# Patient Record
Sex: Male | Born: 1955
Health system: Southern US, Community
[De-identification: ages and names within clinical notes are randomized; demographics above are authoritative.]

## PROBLEM LIST (undated history)

## (undated) DIAGNOSIS — M79669 Pain in unspecified lower leg: Secondary | ICD-10-CM

## (undated) DIAGNOSIS — R42 Dizziness and giddiness: Secondary | ICD-10-CM

## (undated) DIAGNOSIS — Z9889 Other specified postprocedural states: Secondary | ICD-10-CM

## (undated) DIAGNOSIS — M545 Low back pain, unspecified: Secondary | ICD-10-CM

## (undated) DIAGNOSIS — I6529 Occlusion and stenosis of unspecified carotid artery: Secondary | ICD-10-CM

## (undated) DIAGNOSIS — I2510 Atherosclerotic heart disease of native coronary artery without angina pectoris: Secondary | ICD-10-CM

## (undated) DIAGNOSIS — J449 Chronic obstructive pulmonary disease, unspecified: Secondary | ICD-10-CM

## (undated) DIAGNOSIS — R002 Palpitations: Secondary | ICD-10-CM

## (undated) DIAGNOSIS — R0600 Dyspnea, unspecified: Secondary | ICD-10-CM

## (undated) DIAGNOSIS — Z720 Tobacco use: Secondary | ICD-10-CM

## (undated) DIAGNOSIS — I10 Essential (primary) hypertension: Secondary | ICD-10-CM

## (undated) DIAGNOSIS — E7800 Pure hypercholesterolemia, unspecified: Secondary | ICD-10-CM

## (undated) DIAGNOSIS — R011 Cardiac murmur, unspecified: Secondary | ICD-10-CM

## (undated) DIAGNOSIS — C801 Malignant (primary) neoplasm, unspecified: Secondary | ICD-10-CM

## (undated) DIAGNOSIS — Z86010 Personal history of colonic polyps: Secondary | ICD-10-CM

## (undated) DIAGNOSIS — Z87442 Personal history of urinary calculi: Secondary | ICD-10-CM

## (undated) HISTORY — DX: Pain in unspecified lower leg: M79.669

## (undated) HISTORY — DX: Malignant (primary) neoplasm, unspecified: C80.1

## (undated) HISTORY — DX: Essential (primary) hypertension: I10

## (undated) HISTORY — DX: Low back pain: M54.5

## (undated) HISTORY — DX: Dizziness and giddiness: R42

## (undated) HISTORY — DX: Dyspnea, unspecified: R06.00

## (undated) HISTORY — DX: Personal history of colonic polyps: Z86.010

## (undated) HISTORY — DX: Pure hypercholesterolemia, unspecified: E78.00

## (undated) HISTORY — DX: Occlusion and stenosis of unspecified carotid artery: I65.29

## (undated) HISTORY — DX: Low back pain, unspecified: M54.50

## (undated) HISTORY — DX: Palpitations: R00.2

## (undated) HISTORY — PX: SKIN CANCER EXCISION: SHX779

## (undated) HISTORY — PX: MELANOMA EXCISION: SHX5266

---

## 1999-09-27 ENCOUNTER — Other Ambulatory Visit: Admission: RE | Admit: 1999-09-27 | Discharge: 1999-09-27 | Payer: Self-pay | Admitting: Otolaryngology

## 1999-09-27 ENCOUNTER — Encounter (INDEPENDENT_AMBULATORY_CARE_PROVIDER_SITE_OTHER): Payer: Self-pay

## 2010-01-29 ENCOUNTER — Encounter: Payer: Self-pay | Admitting: Cardiovascular Disease

## 2010-07-30 ENCOUNTER — Encounter: Payer: Self-pay | Admitting: Cardiovascular Disease

## 2010-08-06 ENCOUNTER — Encounter: Payer: Self-pay | Admitting: Cardiovascular Disease

## 2010-08-20 ENCOUNTER — Ambulatory Visit
Admission: RE | Admit: 2010-08-20 | Discharge: 2010-08-20 | Payer: Self-pay | Source: Home / Self Care | Attending: Cardiovascular Disease | Admitting: Cardiovascular Disease

## 2010-08-20 ENCOUNTER — Encounter: Payer: Self-pay | Admitting: Cardiovascular Disease

## 2010-08-20 DIAGNOSIS — R0602 Shortness of breath: Secondary | ICD-10-CM | POA: Insufficient documentation

## 2010-08-20 DIAGNOSIS — M545 Low back pain, unspecified: Secondary | ICD-10-CM | POA: Insufficient documentation

## 2010-08-20 DIAGNOSIS — H9190 Unspecified hearing loss, unspecified ear: Secondary | ICD-10-CM | POA: Insufficient documentation

## 2010-08-20 DIAGNOSIS — R079 Chest pain, unspecified: Secondary | ICD-10-CM | POA: Insufficient documentation

## 2010-08-20 DIAGNOSIS — H9319 Tinnitus, unspecified ear: Secondary | ICD-10-CM | POA: Insufficient documentation

## 2010-08-20 DIAGNOSIS — R42 Dizziness and giddiness: Secondary | ICD-10-CM | POA: Insufficient documentation

## 2010-08-20 DIAGNOSIS — Z85828 Personal history of other malignant neoplasm of skin: Secondary | ICD-10-CM | POA: Insufficient documentation

## 2010-08-20 DIAGNOSIS — R002 Palpitations: Secondary | ICD-10-CM | POA: Insufficient documentation

## 2010-08-26 NOTE — Assessment & Plan Note (Signed)
Summary: chest pain/palps/mt   CC:  referal from Dr. Prince Rome for palpitations and chest pain .  History of Present Illness: Jackson Mathews is seen today at the request of Dr Prince Rome.  He has had episodes of dizzyness with nausea and vohmiting.  Associated with SSCP.  Deep pain, not always exertional Cannot really tell me when the pains started.  Seen by ENT and no physical abnormalities to account for vertigo.  Symptoms improving.  Smoker.  Last tried to quit 6 months ago.  1ppd.  Has been wearing nicotine patch for a week.  Counseled for less than 10 minutes and I think the patch is a good approach in combination with electronic cigarette.  He has some dyspnea with no cough or sputum.  Dyspnea likely related to smoking.  Has not had CXR in over a year. Denies syncope, palpitations, edema history of CAD or edema  Current Problems (verified): 1)  Dyspnea  (ICD-786.05) 2)  Chest Pain  (ICD-786.50) 3)  Palpitations  (ICD-785.1) 4)  Low Back Pain, Mild  (ICD-724.2) 5)  Tinnitus  (ICD-388.30) 6)  Skin Cancer, Hx of  (ICD-V10.83) 7)  Dizziness  (ICD-780.4) 8)  Hearing Loss  (ICD-389.9)  Current Medications (verified): 1)  Meclizine Hcl 25 Mg Chew (Meclizine Hcl) .... As Needed 2)  Nicotine 14 Mg/24hr Pt24 (Nicotine) .... As Needed  Allergies (verified): 1)  ! Codeine  Past History:  Past Medical History: Last updated: 08/20/2010 CHEST PAIN  PALPITATIONS  LOW BACK PAIN, MILD TINNITUS SKIN CANCER, HX OF  DIZZINESS HEARING LOSS Lump in right neck being treated with amoxicillin  Past Surgical History: Last updated: 08/20/2010 skin cancer removal  Family History: Last updated: 08/20/2010 CAD Cancer Diabetes lung disease  Social History: Last updated: 08/20/2010 Tobacco Use - Yes.  Alcohol Use - no Single   Review of Systems       Denies fever, malais, weight loss, blurry vision, decreased visual acuity, cough, sputum, hemoptysis, pleuritic pain, palpitaitons, heartburn, abdominal  pain, melena, lower extremity edema, claudication, or rash.   Vital Signs:  Patient profile:   55 year old male Height:      68 inches Weight:      139 pounds BMI:     21.21 Pulse rate:   74 / minute Resp:     14 per minute BP sitting:   144 / 83  (left arm)  Vitals Entered By: Kem Parkinson (August 20, 2010 9:26 AM)  Physical Exam  General:  Affect appropriate Healthy:  appears stated age HEENT: normal Neck supple with no adenopathy JVP normal no bruits no thyromegaly Lungs clear with no wheezing and good diaphragmatic motion Heart:  S1/S2 no murmur,rub, gallop or click PMI normal Abdomen: benighn, BS positve, no tenderness, no AAA no bruit.  No HSM or HJR Distal pulses intact with no bruits No edema Neuro non-focal Skin warm and dry    Impression & Recommendations:  Problem # 1:  DYSPNEA (ICD-786.05) CXR counseled on smoking cessation  F/U Dr Prince Rome Orders: T-2 View CXR (71020TC)  Problem # 2:  CHEST PAIN (ICD-786.50) Atypical normal ECG  F/U ETT Orders: Treadmill (Treadmill)  Patient Instructions: 1)  Your physician has requested that you have an exercise tolerance test.  For further information please visit [email or web address]  Please also follow instruction sheet, as given.

## 2010-08-27 ENCOUNTER — Encounter (INDEPENDENT_AMBULATORY_CARE_PROVIDER_SITE_OTHER): Payer: 59 | Admitting: Physician Assistant

## 2010-08-27 ENCOUNTER — Encounter: Payer: Self-pay | Admitting: Physician Assistant

## 2010-08-27 ENCOUNTER — Encounter (INDEPENDENT_AMBULATORY_CARE_PROVIDER_SITE_OTHER): Payer: 59

## 2010-08-27 DIAGNOSIS — M79609 Pain in unspecified limb: Secondary | ICD-10-CM | POA: Insufficient documentation

## 2010-08-27 DIAGNOSIS — R0989 Other specified symptoms and signs involving the circulatory and respiratory systems: Secondary | ICD-10-CM

## 2010-08-27 DIAGNOSIS — R079 Chest pain, unspecified: Secondary | ICD-10-CM

## 2010-09-01 NOTE — Letter (Signed)
Summary: [facility]   Imported By: Marylou Mccoy 08/20/2010 09:15:28  _____________________________________________________________________  External Attachment:    Type:   Image     Comment:   External Document

## 2010-09-01 NOTE — Letter (Signed)
Summary: [facility]   Imported By: Marylou Mccoy 08/20/2010 09:14:23  _____________________________________________________________________  External Attachment:    Type:   Image     Comment:   External Document

## 2010-09-01 NOTE — Letter (Signed)
Summary: [facility]   Imported By: Marylou Mccoy 08/20/2010 09:13:27  _____________________________________________________________________  External Attachment:    Type:   Image     Comment:   External Document

## 2010-09-22 ENCOUNTER — Encounter: Payer: Self-pay | Admitting: Cardiovascular Disease

## 2010-09-22 ENCOUNTER — Ambulatory Visit (INDEPENDENT_AMBULATORY_CARE_PROVIDER_SITE_OTHER): Payer: 59 | Admitting: Cardiovascular Disease

## 2010-09-22 ENCOUNTER — Encounter (INDEPENDENT_AMBULATORY_CARE_PROVIDER_SITE_OTHER): Payer: 59

## 2010-09-22 DIAGNOSIS — R0989 Other specified symptoms and signs involving the circulatory and respiratory systems: Secondary | ICD-10-CM

## 2010-09-22 DIAGNOSIS — M79609 Pain in unspecified limb: Secondary | ICD-10-CM

## 2010-09-22 DIAGNOSIS — I70219 Atherosclerosis of native arteries of extremities with intermittent claudication, unspecified extremity: Secondary | ICD-10-CM

## 2010-09-22 DIAGNOSIS — R42 Dizziness and giddiness: Secondary | ICD-10-CM

## 2010-09-22 DIAGNOSIS — R072 Precordial pain: Secondary | ICD-10-CM

## 2010-09-30 NOTE — Assessment & Plan Note (Signed)
Summary: ROV/ABI AT 2PM/DM appt confirm=mj   CC:  check up.  History of Present Illness: Jackson Mathews is seen today at the request of Dr Prince Rome.  He has had episodes of dizzyness with nausea and vohmiting.  Associated with SSCP.  Deep pain, not always exertional Cannot really tell me when the pains started.  Seen by ENT and no physical abnormalities to account for vertigo.  Symptoms improving.  Smoker.  Last tried to quit 6 months ago.  1ppd.  Has been wearing nicotine patch  but back to smoking a lot k.  Counseled for less than 10 minutes and I think the patch is a good approach in combination with electronic cigarette.  He has some dyspnea with no cough or sputum.  CXR did show emphysema and I told him he has already had some lung damage form his cigaretts  Initial work up last month had CXR showing emphysema, normal ETT and today he had normal ABI's at there was calf pain with exercise  Current Problems (verified): 1)  Carotid Bruit, Left  (ICD-785.9) 2)  Calf Pain  (ICD-729.5) 3)  Dyspnea  (ICD-786.05) 4)  Chest Pain  (ICD-786.50) 5)  Palpitations  (ICD-785.1) 6)  Low Back Pain, Mild  (ICD-724.2) 7)  Tinnitus  (ICD-388.30) 8)  Skin Cancer, Hx of  (ICD-V10.83) 9)  Dizziness  (ICD-780.4) 10)  Hearing Loss  (ICD-389.9)  Current Medications (verified): 1)  Meclizine Hcl 25 Mg Chew (Meclizine Hcl) .... As Needed 2)  Nicotine 14 Mg/24hr Pt24 (Nicotine) .... As Needed 3)  Nasal Spray .... As Needed  Allergies (verified): 1)  ! Codeine  Past History:  Past Medical History: Last updated: 08/20/2010 CHEST PAIN  PALPITATIONS  LOW BACK PAIN, MILD TINNITUS SKIN CANCER, HX OF  DIZZINESS HEARING LOSS Lump in right neck being treated with amoxicillin  Past Surgical History: Last updated: 08/20/2010 skin cancer removal  Family History: Last updated: 08/20/2010 CAD Cancer Diabetes lung disease  Social History: Last updated: 08/20/2010 Tobacco Use - Yes.  Alcohol Use - no Single     Review of Systems       Denies fever, malais, weight loss, blurry vision, decreased visual acuity, cough, sputum, hemoptysis, pleuritic pain, palpitaitons, heartburn, abdominal pain, melena, lower extremity edema, claudication, or rash.   Vital Signs:  Patient profile:   55 year old male Height:      68 inches Weight:      139 pounds BMI:     21.21 Pulse rate:   78 / minute Resp:     14 per minute BP sitting:   130 / 80  (left arm)  Vitals Entered By: Kem Parkinson (September 22, 2010 3:28 PM)  Physical Exam  General:  Affect appropriate Healthy:  appears stated age HEENT: normal Neck supple with no adenopathy JVP normal left bruits no thyromegaly Lungs clear with no wheezing and good diaphragmatic motion Heart:  S1/S2 no murmur,rub, gallop or click PMI normal Abdomen: benighn, BS positve, no tenderness, no AAA no bruit.  No HSM or HJR Distal pulses intact with no bruits No edema Neuro non-focal Skin warm and dry    Impression & Recommendations:  Problem # 1:  CAROTID BRUIT, LEFT (ICD-785.9)  F/U duplex especially given smoking  Orders: Carotid Duplex (Carotid Duplex)  Problem # 2:  CALF PAIN (ICD-729.5) Secondary to deconditoning.  ABI's normal wtih no evidence of PVD  Problem # 3:  CHEST PAIN (ICD-786.50) Reolsved.  Normal ETT  Continue RF modification  Problem # 4:  DYSPNEA (ICD-786.05) COPD F/U Dr Prince Rome Consider PFT;s  Patient Instructions: 1)  Your physician recommends that you schedule a follow-up appointment in: 1 year with Dr. Eden Emms  2)  Your physician recommends that you continue on your current medications as directed. Please refer to the Current Medication list given to you today. 3)  Your physician has requested that you have a carotid duplex. This test is an ultrasound of the carotid arteries in your neck. It looks at blood flow through these arteries that supply the brain with blood. Allow one hour for this exam. There are no restrictions  or special instructions.

## 2010-10-01 ENCOUNTER — Encounter (INDEPENDENT_AMBULATORY_CARE_PROVIDER_SITE_OTHER): Payer: 59

## 2010-10-01 ENCOUNTER — Encounter: Payer: Self-pay | Admitting: Cardiovascular Disease

## 2010-10-01 DIAGNOSIS — I6529 Occlusion and stenosis of unspecified carotid artery: Secondary | ICD-10-CM

## 2011-04-08 ENCOUNTER — Other Ambulatory Visit: Payer: Self-pay | Admitting: Cardiovascular Disease

## 2011-04-08 DIAGNOSIS — I6529 Occlusion and stenosis of unspecified carotid artery: Secondary | ICD-10-CM

## 2011-04-11 ENCOUNTER — Encounter (INDEPENDENT_AMBULATORY_CARE_PROVIDER_SITE_OTHER): Payer: 59 | Admitting: *Deleted

## 2011-04-11 DIAGNOSIS — I6529 Occlusion and stenosis of unspecified carotid artery: Secondary | ICD-10-CM

## 2011-04-13 ENCOUNTER — Telehealth: Payer: Self-pay | Admitting: Cardiovascular Disease

## 2011-04-13 NOTE — Telephone Encounter (Signed)
Spoke with pt, carotid dopplers forwarded to [facility]. Await appt for eval Jackson Mathews

## 2011-04-13 NOTE — Telephone Encounter (Signed)
Pt was calling to f/u on the date for his surgery he still has not heard anything

## 2011-04-15 ENCOUNTER — Encounter: Payer: Self-pay | Admitting: Vascular Surgery

## 2011-04-15 ENCOUNTER — Encounter: Payer: Self-pay | Admitting: Surgery

## 2011-04-18 ENCOUNTER — Encounter: Payer: Self-pay | Admitting: Surgery

## 2011-04-18 ENCOUNTER — Ambulatory Visit (INDEPENDENT_AMBULATORY_CARE_PROVIDER_SITE_OTHER): Payer: 59 | Admitting: Surgery

## 2011-04-18 VITALS — BP 138/83 | HR 69 | Resp 16 | Ht 68.0 in | Wt 137.5 lb

## 2011-04-18 DIAGNOSIS — I6529 Occlusion and stenosis of unspecified carotid artery: Secondary | ICD-10-CM

## 2011-04-18 NOTE — Progress Notes (Signed)
[facility] [place]   Patient name: Jackson Mathews MRN: [id number] DOB: September 10, 1955 Sex: male   Referred by: Dr. Eden Emms  Reason for referral:  Chief Complaint  Patient presents with  . Carotid    REF->  Dr. Charlton Haws     HISTORY OF PRESENT ILLNESS: This is a 55 year old gentleman seen at the request of Dr.Nishan for evaluation of carotid stenosis. The patient has been followed with serial ultrasounds for her carotid lesion improves auscultated recently and his repeat ultrasound showed greater than 80% stenosis. The patient denies symptoms. Specifically he denies numbness or weakness in either extremity no slurred speech no amaurosis fugax. He does have a history of dizzy spells. He takes meclizine as needed. He continues to be a smoker smoking approximately one to 2 packs per day.  Past Medical History  Diagnosis Date  . Palpitations   . Lower back pain   . Hearing loss   . Dyspnea   . Calf pain   . Chest pain   . Tinnitus   . Vertigo   . Cancer     Skin  . Carotid artery occlusion     Past Surgical History  Procedure Date  . Skin cancer excision   . Melanoma excision     l eye    History   Social History  . Marital Status: Single    Spouse Name: N/A    Number of Children: N/A  . Years of Education: N/A   Occupational History  . Not on file.   Social History Main Topics  . Smoking status: Current Everyday Smoker -- 2.0 packs/day for 30 years    Types: Cigarettes  . Smokeless tobacco: Not on file  . Alcohol Use: No  . Drug Use: No  . Sexually Active:    Other Topics Concern  . Not on file   Social History Narrative  . No narrative on file    Family History  Problem Relation Age of Onset  . Heart disease Mother   . Cancer Father   . Diabetes Brother     Allergies as of 04/18/2011 - Review Complete 04/18/2011  Allergen Reaction Noted  . Codeine      No current outpatient prescriptions on file prior to visit.      REVIEW OF SYSTEMS: Positive for shortness of breath with exertion? Heart murmur dizziness. All the review of systems are negative  PHYSICAL EXAMINATION: General: The patient appears their stated age.  Vital signs are BP 138/83  Pulse 69  Resp 16  Ht 5\' 8"  (1.727 m)  Wt 137 lb 8 oz (62.37 kg)  BMI 20.91 kg/m2 Pulmonary: There is a good air exchange bilaterally without wheezing or rales. Abdomen: Soft and non-tender with normal pitch bowel sounds. Aorta is palpable and nonaneurysmal Musculoskeletal: There are no major deformities.  There is no significant extremity pain. Neurologic: No focal weakness or paresthesias are detected, Skin: There are no ulcer or rashes noted. Psychiatric: The patient has normal affect. Cardiovascular: There is a regular rate and rhythm without significant murmur appreciated. Right carotid bruit pedal pulses are palpable  Diagnostic Studies: Ultrasound from the [facility] office shows greater than 80% right carotid stenosis with 40-59% left carotid stenosis velocities on the right are  361/153  Outside Studies/Documentation Historical records were reviewed.  They showed asymptomatic right carotid stenosis  Medication Changes: I added a baby aspirin  Assessment:  Asymptomatic right carotid stenosis Plan: I discussed the options for treating carotid  disease. We discussed briefly stenting and carotid endarterectomy. Lipase is a better candidate for carotid endarterectomy. We discussed the risks and benefits including the risk of stroke the risk of nerve injury. We discussed the operative details and as well as the postoperative recovery. All his questions were answered today he is scheduled and didn't desire to attend a function we are scheduling his endarterectomy for Thursday, October 11.  The patient is complaining of swelling in his mouth he is concerned that he has an abscessed tooth he is taken amoxicillin recently that he had a home. I'm going to send  him for a formal dental evaluation so that if this is an abscessed tooth it can be addressed prior to his operation.     Jorge Ny, M.D. [facility] [place] Office: [phone]

## 2011-04-29 ENCOUNTER — Telehealth: Payer: Self-pay | Admitting: Cardiovascular Disease

## 2011-04-29 ENCOUNTER — Other Ambulatory Visit: Payer: Self-pay | Admitting: Surgery

## 2011-04-29 ENCOUNTER — Encounter (HOSPITAL_COMMUNITY)
Admission: RE | Admit: 2011-04-29 | Discharge: 2011-04-29 | Disposition: A | Discharge status: Home / Self Care | Payer: 59 | Source: Ambulatory Visit | Attending: Surgery | Admitting: Surgery

## 2011-04-29 DIAGNOSIS — I6529 Occlusion and stenosis of unspecified carotid artery: Secondary | ICD-10-CM

## 2011-04-29 LAB — CBC
HCT: 47.9 % (ref 39.0–52.0)
Hemoglobin: 16.3 g/dL (ref 13.0–17.0)
MCV: 87.4 fL (ref 78.0–100.0)
RDW: 12.9 % (ref 11.5–15.5)
WBC: 6 10*3/uL (ref 4.0–10.5)

## 2011-04-29 LAB — COMPREHENSIVE METABOLIC PANEL
ALT: 11 U/L (ref 0–53)
AST: 14 U/L (ref 0–37)
Alkaline Phosphatase: 129 U/L — ABNORMAL HIGH (ref 39–117)
CO2: 30 mEq/L (ref 19–32)
Chloride: 102 mEq/L (ref 96–112)
GFR calc non Af Amer: >90 mL/min (ref >90)
Potassium: 4.3 mEq/L (ref 3.5–5.1)
Sodium: 140 mEq/L (ref 135–145)
Total Bilirubin: 0.5 mg/dL (ref 0.3–1.2)

## 2011-04-29 LAB — URINALYSIS, ROUTINE W REFLEX MICROSCOPIC
Bilirubin Urine: NEGATIVE
Glucose, UA: NEGATIVE mg/dL
Hgb urine dipstick: NEGATIVE
Protein, ur: NEGATIVE mg/dL
Urobilinogen, UA: 0.2 mg/dL (ref 0.0–1.0)

## 2011-04-29 LAB — SURGICAL PCR SCREEN: MRSA, PCR: NEGATIVE

## 2011-04-29 LAB — ABO/RH: ABO/RH(D): O POS

## 2011-04-29 LAB — TYPE AND SCREEN: Antibody Screen: NEGATIVE

## 2011-04-29 LAB — APTT: aPTT: 30 seconds (ref 24–37)

## 2011-04-29 NOTE — Telephone Encounter (Signed)
Stress,Lov,12 lead faxed to [facility] @ [phone]  04/29/11/km

## 2011-05-05 ENCOUNTER — Inpatient Hospital Stay (HOSPITAL_COMMUNITY)
Admission: RE | Admit: 2011-05-05 | Discharge: 2011-05-06 | DRG: 039 | Disposition: A | Discharge status: Home / Self Care | Payer: 59 | Source: Ambulatory Visit | Attending: Surgery | Admitting: Surgery

## 2011-05-05 ENCOUNTER — Other Ambulatory Visit: Payer: Self-pay | Admitting: Surgery

## 2011-05-05 DIAGNOSIS — Z01812 Encounter for preprocedural laboratory examination: Secondary | ICD-10-CM

## 2011-05-05 DIAGNOSIS — J438 Other emphysema: Secondary | ICD-10-CM | POA: Diagnosis present

## 2011-05-05 DIAGNOSIS — F17200 Nicotine dependence, unspecified, uncomplicated: Secondary | ICD-10-CM | POA: Diagnosis present

## 2011-05-05 DIAGNOSIS — I6529 Occlusion and stenosis of unspecified carotid artery: Secondary | ICD-10-CM

## 2011-05-05 DIAGNOSIS — Z7982 Long term (current) use of aspirin: Secondary | ICD-10-CM

## 2011-05-05 HISTORY — PX: CAROTID ENDARTERECTOMY: SUR193

## 2011-05-05 LAB — CARDIAC PANEL(CRET KIN+CKTOT+MB+TROPI): Total CK: 112 U/L (ref 7–232)

## 2011-05-06 LAB — BASIC METABOLIC PANEL
BUN: 7 mg/dL (ref 6–23)
Chloride: 102 mEq/L (ref 96–112)
Creatinine, Ser: 0.71 mg/dL (ref 0.50–1.35)
GFR calc Af Amer: >90 mL/min (ref >90)
Glucose, Bld: 117 mg/dL — ABNORMAL HIGH (ref 70–99)
Potassium: 3.9 mEq/L (ref 3.5–5.1)

## 2011-05-06 LAB — GLUCOSE, CAPILLARY

## 2011-05-06 LAB — CBC
HCT: 41.3 % (ref 39.0–52.0)
Hemoglobin: 13.6 g/dL (ref 13.0–17.0)
MCHC: 32.9 g/dL (ref 30.0–36.0)
MCV: 87.9 fL (ref 78.0–100.0)
RDW: 13.1 % (ref 11.5–15.5)
WBC: 6.3 10*3/uL (ref 4.0–10.5)

## 2011-05-10 ENCOUNTER — Encounter: Payer: 59 | Admitting: Vascular Surgery
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
## 2011-06-03 ENCOUNTER — Encounter: Payer: Self-pay | Admitting: Surgery

## 2011-06-06 ENCOUNTER — Ambulatory Visit (INDEPENDENT_AMBULATORY_CARE_PROVIDER_SITE_OTHER): Payer: 59 | Admitting: Surgery

## 2011-06-06 ENCOUNTER — Encounter: Payer: Self-pay | Admitting: Surgery

## 2011-06-06 VITALS — BP 156/89 | HR 95 | Temp 97.9°F | Ht 68.0 in | Wt 136.0 lb

## 2011-06-06 DIAGNOSIS — I6529 Occlusion and stenosis of unspecified carotid artery: Secondary | ICD-10-CM

## 2011-06-06 DIAGNOSIS — Z9889 Other specified postprocedural states: Secondary | ICD-10-CM
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
## 2011-06-20 ENCOUNTER — Encounter: Payer: Self-pay | Admitting: *Deleted

## 2011-11-14 ENCOUNTER — Telehealth: Payer: Self-pay | Admitting: Cardiovascular Disease

## 2011-11-14 NOTE — Telephone Encounter (Signed)
New Problem:     I called the patient and was unable to reach them. I left a message on their voicemail with my name, the reason I called, the name of his physician, and a number to call back to schedule their appointment. 

## 2011-12-02 ENCOUNTER — Encounter: Payer: Self-pay | Admitting: Neurosurgery

## 2011-12-05 ENCOUNTER — Encounter: Payer: Self-pay | Admitting: Neurosurgery

## 2011-12-05 ENCOUNTER — Ambulatory Visit (INDEPENDENT_AMBULATORY_CARE_PROVIDER_SITE_OTHER): Payer: 59 | Admitting: Neurosurgery

## 2011-12-05 ENCOUNTER — Other Ambulatory Visit (INDEPENDENT_AMBULATORY_CARE_PROVIDER_SITE_OTHER): Payer: 59 | Admitting: Vascular Surgery

## 2011-12-05 VITALS — BP 127/81 | HR 67 | Resp 16 | Ht 68.0 in | Wt 133.6 lb

## 2011-12-05 DIAGNOSIS — Z48812 Encounter for surgical aftercare following surgery on the circulatory system: Secondary | ICD-10-CM

## 2011-12-05 DIAGNOSIS — I6529 Occlusion and stenosis of unspecified carotid artery: Secondary | ICD-10-CM

## 2011-12-05 NOTE — Progress Notes (Signed)
[facility] [place] HISTORY AND PHYSICAL   CC: Six-month postop carotid duplex status post right CEA Referring Physician: Brabham  History of Present Illness: 56 year old male patient of Dr. Estanislado Spire is now 6 months status post right CEA and doing well. He denies signs or symptoms of CVA, TIA, amaurosis fugax, or word finding difficulty. Patient states he had some intermittent dizziness prior to the surgery since surgery he has had no symptoms whatsoever.  Past Medical History  Diagnosis Date  . Palpitations   . Lower back pain   . Hearing loss   . Dyspnea   . Calf pain   . Chest pain   . Tinnitus   . Vertigo   . Cancer     Skin  . Carotid artery occlusion     ROS: [x]  Positive   [ ]  Denies    General: [ ]  Weight loss, [ ]  Fever, [ ]  chills Neurologic: [ ]  Dizziness, [ ]  Blackouts, [ ]  Seizure [ ]  Stroke, [ ]  "Mini stroke", [ ]  Slurred speech, [ ]  Temporary blindness; [ ]  weakness in arms or legs, [ ]  Hoarseness Cardiac: [ ]  Chest pain/pressure, [ ]  Shortness of breath at rest [ ]  Shortness of breath with exertion, [ ]  Atrial fibrillation or irregular heartbeat Vascular: [ ]  Pain in legs with walking, [ ]  Pain in legs at rest, [ ]  Pain in legs at night,  [ ]  Non-healing ulcer, [ ]  Blood clot in vein/DVT,   Pulmonary: [ ]  Home oxygen, [ ]  Productive cough, [ ]  Coughing up blood, [ ]  Asthma,  [ ]  Wheezing Musculoskeletal:  [ ]  Arthritis, [ ]  Low back pain, [ ]  Joint pain Hematologic: [ ]  Easy Bruising, [ ]  Anemia; [ ]  Hepatitis Gastrointestinal: [ ]  Blood in stool, [ ]  Gastroesophageal Reflux/heartburn, [ ]  Trouble swallowing Urinary: [ ]  chronic Kidney disease, [ ]  on HD - [ ]  MWF or [ ]  TTHS, [ ]  Burning with urination, [ ]  Difficulty urinating Skin: [ ]  Rashes, [ ]  Wounds Psychological: [ ]  Anxiety, [ ]  Depression   Social History History  Substance Use Topics  . Smoking status: Current Everyday Smoker -- 0.5 packs/day for 30 years    Types:  Cigarettes  . Smokeless tobacco: Not on file  . Alcohol Use: No    Family History Family History  Problem Relation Age of Onset  . Heart disease Mother   . Cancer Father   . Diabetes Brother     Allergies  Allergen Reactions  . Codeine     Nausea     Current Outpatient Prescriptions  Medication Sig Dispense Refill  . meclizine (ANTIVERT) 25 MG tablet Take 25 mg by mouth 3 (three) times daily as needed.        . nicotine (NICODERM CQ - DOSED IN MG/24 HOURS) 14 mg/24hr patch Place 1 patch onto the skin daily. Prn only        Physical Examination  Filed Vitals:   12/05/11 1448  BP: 127/81  Pulse: 67  Resp: 16    Body mass index is 20.31 kg/(m^2).  General:  WDWN in NAD Gait: Normal HEENT: WNL Eyes: Pupils equal Pulmonary: normal non-labored breathing , without Rales, rhonchi,  wheezing Cardiac: RRR, without  Murmurs, rubs or gallops; Abdomen: soft, NT, no masses Skin: no rashes, ulcers noted  Vascular Exam Pulses: 2+ radial pulses Carotid bruits: Bilateral carotid pulses to auscultation no bruits are heard Extremities without ischemic changes, no Gangrene ,  no cellulitis; no open wounds;  Musculoskeletal: no muscle wasting or atrophy   Neurologic: A&O X 3; Appropriate Affect ; SENSATION: normal; MOTOR FUNCTION:  moving all extremities equally. Speech is fluent/normal  Non-Invasive Vascular Imaging CAROTID DUPLEX 12/05/2011  Right ICA 0 - 19% stenosis Left ICA 40 - 59 % stenosis   ASSESSMENT/PLAN: Patient with asymptomatic carotid stenosis 6 months status post right CEA, plan will be for him to followup here in one year with repeat carotid duplex and be seen in my clinic, his questions were encouraged and answered, he is in agreement with this plan. Lauree Chandler ANP   Clinic MD: Myra Gianotti

## 2011-12-12 NOTE — Procedures (Unsigned)
CAROTID DUPLEX EXAM  INDICATION:  Carotid stenosis  HISTORY: Diabetes:  No Cardiac:  No Hypertension:  No Smoking:  Currently Previous Surgery:  Right carotid endarterectomy on 05/05/2011 CV History:  Asymptomatic Amaurosis Fugax No, Paresthesias No, Hemiparesis No                                      RIGHT             LEFT Brachial systolic pressure:         134               132 Brachial Doppler waveforms:         WNL               WNL Vertebral direction of flow:        Antegrade         Antegrade DUPLEX VELOCITIES (cm/sec) CCA peak systolic                   87                115 ECA peak systolic                   71                134 ICA peak systolic                   93                114 ICA end diastolic                   39                41 PLAQUE MORPHOLOGY:                  Not visualized    Heterogeneous PLAQUE AMOUNT:                      Not visualized    Moderate PLAQUE LOCATION:                    Not visualized    CCA/ICA  IMPRESSION: 1. Right internal carotid artery is patent with history of     endarterectomy.  No hyperplasia or hemodynamically significant     plaque is identified. 2. Bilateral external carotid arteries appear patent. 3. Left internal carotid artery stenosis present in the 40%-59% range. 4. Bilateral vertebral arteries are patent and antegrade.  ___________________________________________ V. Charlena Cross, MD  SH/MEDQ  D:  12/05/2011  T:  12/05/2011  Job:  [id number]

## 2012-11-22 ENCOUNTER — Other Ambulatory Visit: Payer: Self-pay | Admitting: *Deleted

## 2012-11-22 DIAGNOSIS — Z48812 Encounter for surgical aftercare following surgery on the circulatory system: Secondary | ICD-10-CM

## 2012-12-04 ENCOUNTER — Other Ambulatory Visit: Payer: 59

## 2012-12-04 ENCOUNTER — Ambulatory Visit: Payer: 59 | Admitting: Neurosurgery

## 2013-01-11 ENCOUNTER — Encounter: Payer: Self-pay | Admitting: Surgery

## 2013-01-14 ENCOUNTER — Other Ambulatory Visit: Payer: 59

## 2013-01-14 ENCOUNTER — Ambulatory Visit: Payer: 59 | Admitting: Surgery

## 2013-03-15 DIAGNOSIS — Z0279 Encounter for issue of other medical certificate: Secondary | ICD-10-CM

## 2013-03-18 ENCOUNTER — Other Ambulatory Visit: Payer: 59

## 2013-03-18 ENCOUNTER — Ambulatory Visit: Payer: 59 | Admitting: Surgery

## 2013-04-15 ENCOUNTER — Other Ambulatory Visit: Payer: 59

## 2013-04-15 ENCOUNTER — Ambulatory Visit: Payer: PRIVATE HEALTH INSURANCE | Admitting: Family

## 2013-05-17 ENCOUNTER — Encounter: Payer: Self-pay | Admitting: Family

## 2013-05-20 ENCOUNTER — Other Ambulatory Visit (HOSPITAL_COMMUNITY): Payer: PRIVATE HEALTH INSURANCE

## 2013-05-20 ENCOUNTER — Ambulatory Visit: Payer: PRIVATE HEALTH INSURANCE | Admitting: Family

## 2014-04-24 ENCOUNTER — Other Ambulatory Visit: Payer: Self-pay | Admitting: Dermatology

## 2014-05-22 ENCOUNTER — Encounter (HOSPITAL_COMMUNITY): Payer: Self-pay | Admitting: Emergency Medicine

## 2014-05-22 ENCOUNTER — Emergency Department (HOSPITAL_COMMUNITY)
Admission: EM | Admit: 2014-05-22 | Discharge: 2014-05-22 | Disposition: A | Discharge status: Home / Self Care | Payer: Self-pay | Attending: Emergency Medicine | Admitting: Emergency Medicine

## 2014-05-22 ENCOUNTER — Emergency Department (HOSPITAL_COMMUNITY): Payer: Self-pay

## 2014-05-22 ENCOUNTER — Emergency Department (INDEPENDENT_AMBULATORY_CARE_PROVIDER_SITE_OTHER)
Admission: EM | Admit: 2014-05-22 | Discharge: 2014-05-22 | Disposition: A | Discharge status: Nursing Home (Includes ICF) | Payer: Self-pay | Source: Home / Self Care | Attending: Family Medicine | Admitting: Family Medicine

## 2014-05-22 DIAGNOSIS — R42 Dizziness and giddiness: Secondary | ICD-10-CM

## 2014-05-22 DIAGNOSIS — Z5309 Procedure and treatment not carried out because of other contraindication: Secondary | ICD-10-CM

## 2014-05-22 DIAGNOSIS — Z720 Tobacco use: Secondary | ICD-10-CM | POA: Insufficient documentation

## 2014-05-22 DIAGNOSIS — I6523 Occlusion and stenosis of bilateral carotid arteries: Secondary | ICD-10-CM | POA: Insufficient documentation

## 2014-05-22 DIAGNOSIS — Z85828 Personal history of other malignant neoplasm of skin: Secondary | ICD-10-CM | POA: Insufficient documentation

## 2014-05-22 DIAGNOSIS — H9190 Unspecified hearing loss, unspecified ear: Secondary | ICD-10-CM | POA: Insufficient documentation

## 2014-05-22 DIAGNOSIS — R531 Weakness: Secondary | ICD-10-CM | POA: Insufficient documentation

## 2014-05-22 LAB — COMPREHENSIVE METABOLIC PANEL
ALBUMIN: 4 g/dL (ref 3.5–5.2)
ALT: 9 U/L (ref 0–53)
AST: 16 U/L (ref 0–37)
Alkaline Phosphatase: 128 U/L — ABNORMAL HIGH (ref 39–117)
Anion gap: 12 (ref 5–15)
BUN: 7 mg/dL (ref 6–23)
CHLORIDE: 101 meq/L (ref 96–112)
CO2: 29 mEq/L (ref 19–32)
CREATININE: 0.78 mg/dL (ref 0.50–1.35)
Calcium: 9.5 mg/dL (ref 8.4–10.5)
GFR calc Af Amer: >90 mL/min (ref >90)
GFR calc non Af Amer: >90 mL/min (ref >90)
Glucose, Bld: 123 mg/dL — ABNORMAL HIGH (ref 70–99)
Potassium: 5.2 mEq/L (ref 3.7–5.3)
Sodium: 142 mEq/L (ref 137–147)
Total Bilirubin: 0.4 mg/dL (ref 0.3–1.2)
Total Protein: 7.3 g/dL (ref 6.0–8.3)

## 2014-05-22 LAB — CBC WITH DIFFERENTIAL/PLATELET
BASOS ABS: 0 10*3/uL (ref 0.0–0.1)
BASOS PCT: 0 % (ref 0–1)
Eosinophils Absolute: 0 10*3/uL (ref 0.0–0.7)
Eosinophils Relative: 0 % (ref 0–5)
HCT: 47.3 % (ref 39.0–52.0)
Hemoglobin: 15.7 g/dL (ref 13.0–17.0)
Lymphocytes Relative: 28 % (ref 12–46)
Lymphs Abs: 1.4 10*3/uL (ref 0.7–4.0)
MCH: 29.7 pg (ref 26.0–34.0)
MCHC: 33.2 g/dL (ref 30.0–36.0)
MCV: 89.6 fL (ref 78.0–100.0)
MONO ABS: 0.4 10*3/uL (ref 0.1–1.0)
Monocytes Relative: 8 % (ref 3–12)
NEUTROS ABS: 3.1 10*3/uL (ref 1.7–7.7)
Neutrophils Relative %: 64 % (ref 43–77)
PLATELETS: 251 10*3/uL (ref 150–400)
RBC: 5.28 MIL/uL (ref 4.22–5.81)
RDW: 13.2 % (ref 11.5–15.5)
WBC: 4.8 10*3/uL (ref 4.0–10.5)

## 2014-05-22 MED ORDER — ASPIRIN 81 MG PO CHEW: 324.0000 mg | CHEWABLE_TABLET | Freq: Every day | ORAL | Status: DC

## 2014-05-22 NOTE — Progress Notes (Signed)
Bilateral carotid artery duplex completed:  1-39% ICA stenosis.  Vertebral artery flow is antegrade.     

## 2014-05-22 NOTE — ED Notes (Signed)
Patient transported to CT 

## 2014-05-22 NOTE — ED Provider Notes (Signed)
CSN: [id number]     Arrival date & time 05/22/14  1121 History   First MD Initiated Contact with Patient 05/22/14 1147     Chief Complaint  Patient presents with  . Dizziness   (Consider location/radiation/quality/duration/timing/severity/associated sxs/prior Treatment) HPI Comments: Patient states he has had intermittent episodes of dizziness with associated near syncope, nausea, diaphoresis and ataxia since 2013. States these occur about every 3 months, however, he is here because these episodes have increased in intensity, duration and frequency over the past 30 days. Is now having episodes every 5-7 days, the most severe of which occurred last night.  Has been trying to establish with [facility] for primary care, but is currently on new patient waiting list. 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 Hx of known vascular disease. S/P right CEA in 2012.   Patient is a 58 y.o. male presenting with dizziness. The history is provided by the patient.  Dizziness Quality:  Room spinning, lightheadedness, head spinning and imbalance Severity:  Moderate Onset quality:  Gradual Duration: Has been having "spells" since 2013. Associated symptoms: nausea     Past Medical History  Diagnosis Date  . Palpitations   . Lower back pain   . Hearing loss   . Dyspnea   . Calf pain   . Chest pain   . Tinnitus   . Vertigo   . Cancer     Skin  . Carotid artery occlusion    Past Surgical History  Procedure Laterality Date  . Skin cancer excision    . Melanoma excision      l eye  . Carotid endarterectomy  05/05/11    Right CEA   Family History  Problem Relation Age of Onset  . Heart disease Mother   . Cancer Father   . Diabetes Brother    History  Substance Use Topics  . Smoking status: Current Every Day Smoker -- 0.50 packs/day for 30 years    Types: Cigarettes  . Smokeless tobacco: Not on file  . Alcohol Use: No    Review of Systems  HENT: Negative.   Eyes:  Negative.   Respiratory: Negative.   Cardiovascular: Negative.   Gastrointestinal: Positive for nausea.  Endocrine: Negative.   Genitourinary: Negative.   Musculoskeletal: Negative.   Skin: Negative.   Neurological: Positive for dizziness, weakness and light-headedness.  Psychiatric/Behavioral: Negative.     Allergies  Codeine  Home Medications   Prior to Admission medications   Medication Sig Start Date End Date Taking? Authorizing Provider  meclizine (ANTIVERT) 25 MG tablet Take 25 mg by mouth 3 (three) times daily as needed.      Historical Provider, MD  nicotine (NICODERM CQ - DOSED IN MG/24 HOURS) 14 mg/24hr patch Place 1 patch onto the skin daily. Prn only    Historical Provider, MD   Temp(Src) 98.1 F (36.7 C) (Oral)  Resp 14  SpO2 98% Physical Exam  Nursing note and vitals reviewed. Constitutional: He is oriented to person, place, and time. He appears well-developed and well-nourished. No distress.  HENT:  Head: Normocephalic and atraumatic.  Right Ear: External ear normal.  Left Ear: External ear normal.  Nose: Nose normal.  Mouth/Throat: Oropharynx is clear and moist.  Eyes: Conjunctivae and EOM are normal. Pupils are equal, round, and reactive to light.  Neck: Normal range of motion. Neck supple.  No carotid bruits  Cardiovascular: Normal rate, regular rhythm and normal heart sounds.   Pulmonary/Chest: Effort normal and breath sounds  normal. No respiratory distress. He has no wheezes.  Abdominal: Soft. Bowel sounds are normal. He exhibits no distension. There is no tenderness.  Musculoskeletal: Normal range of motion.  Lymphadenopathy:    He has no cervical adenopathy.  Neurological: He is alert and oriented to person, place, and time. He has normal strength. No cranial nerve deficit or sensory deficit. Coordination and gait normal. GCS eye subscore is 4. GCS verbal subscore is 5. GCS motor subscore is 6.  Skin: Skin is warm and dry. No rash noted. No erythema.   Psychiatric: He has a normal mood and affect. His behavior is normal.    ED Course  Procedures (including critical care time) Labs Review Labs Reviewed - No data to display  Imaging Review No results found.   MDM   1. Dizziness   Normal VS with no focal deficits. Advised patient to report directly to [facility] ER evaluation. No access to primary care for outpatient evaluation.     Lutricia Feil, [place] 05/22/14 1311

## 2014-05-22 NOTE — ED Notes (Signed)
Per pt last "dizzy spell" was last night about "12:30 last night" and lasted for about "a couple of minutes". Pt states that he has not vomited "for a while". Pt states that he also had a "dizzy spell" yesterday morning.
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
## 2014-05-22 NOTE — ED Notes (Signed)
Pt sent from [facility] for further eval of dizziness and N/V episodes x 1 month

## 2014-05-22 NOTE — Discharge Instructions (Signed)
Atherosclerosis Atherosclerosis, or hardening of the arteries, is the buildup of plaque within the major arteries in the body. Plaque is made up of fats (lipids), cholesterol, calcium, and fibrous tissue. Plaque can narrow or block blood flow within an artery. Plaque can break off and cause damage to the affected organ. Plaque can also "rupture." When plaque ruptures within an artery, a clot can form, causing a sudden (acute) blockage of the artery. Untreated atherosclerosis can cause serious health problems or death.  RISK FACTORS  High cholesterol levels.  Smoking.  Obesity.  Lack of activity or exercise.  Eating a diet high in saturated fat.  Family history.  Diabetes. SIGNS AND SYMPTOMS  Symptoms of atherosclerosis can occur when blood flow to an artery is slowed or blocked. Severity and onset of symptoms depends on how extensive the narrowing or blockage is. A sudden plaque rupture can bring immediate, life-threatening symptoms. Atherosclerosis can affect different arteries in the body, for example:  Coronary arteries. The coronary arteries supply the heart with blood. When the coronary arteries are narrowed or blocked from atherosclerosis, this is known as coronary artery disease (CAD). CAD can cause a heart attack. Common heart attack symptoms include:  Chest pain or pain that radiates to the neck, arm, jaw, or in the upper, middle back (mid-scapular pain).  Shortness of breath without cause.  Profuse sweating while at rest.  Irregular heartbeats.  Nausea or gastrointestinal upset.  Carotid arteries. The carotid arteries supply the brain with blood. They are located on each side of your neck. When blood flow to these arteries is slowed or blocked, a transient ischemic attack (TIA) or stroke can occur. A TIA is considered a "mini-stroke" or "warning stroke." TIA symptoms are the same as stroke symptoms, but they are temporary and last less than 24 hours. A stroke can cause  permanent damage or death. Common TIA and stroke symptoms include:  Sudden numbness or weakness to one side of your body, such as the face, arm, or leg.  Sudden confusion or trouble speaking or understanding.  Sudden trouble seeing out of one or both eyes.  Sudden trouble walking, loss of balance, or dizziness.  Sudden, severe headache with no known cause.  Arteries in the legs. When arteries in the lower legs become narrowed or blocked, this is known as peripheral vascular disease (PVD). PVD can cause a symptom called claudication. Claudication is pain or a burning feeling in your legs when walking or exercising and usually goes away with rest. Very severe PVD can cause pain in your legs while at rest.  Renal arteries. The renal arteries supply the kidneys with blood. Blockage of the renal arteries can cause a decline in kidney function or high blood pressure (hypertension).  Gastrointestinal arteries (mesenteric circulation). Abdominal pain may occur after eating. DIAGNOSIS  Your health care provider may perform the following tests to diagnose atherosclerosis:  Blood tests.  Stress test.  Echocardiogram.  Nuclear scan.  Ankle/brachial index.  Ultrasonography.  Computed tomography (CT) scan.  Angiography. TREATMENT  Atherosclerosis treatment includes the following:  Lifestyle changes such as:  Quitting smoking. Your health care provider can help you with smoking cessation.  Eating a diet low in saturated fat. A registered dietitian can educate you on healthy food options, such as helping you understand the difference between good fat and bad fat.  Following an exercise program approved by your health care provider.  Maintaining a healthy weight. Lose weight as approved by your health care provider.  Have   your cholesterol levels checked as directed by your health care provider.  Medicines. Cholesterol medicines can help slow or stop the progression of  atherosclerosis.  Different procedural or surgical interventions to treat atherosclerosis include:  Balloon angioplasty. The technical name for balloon angioplasty is percutaneous transluminal angioplasty (PTA). In this procedure, a catheter with a small balloon at the tip is inserted through the blocked or narrowed artery. The balloon is then inflated. When the balloon is inflated, the fatty plaque is compressed against the artery wall, allowing better blood flow within the artery.  Balloon angioplasty and stenting. In this procedure, balloon angioplasty is combined with a stenting procedure. A stent is a small, metal mesh tube that keeps the artery open. After the artery is opened up by the balloon technique, the stent is then deployed. The stent is permanent.  Open heart surgery or bypass surgery. To perform this type of surgery, a healthy vessel is first "harvested" from either the leg or arm. The harvested vessel is then used to "bypass" the blocked atherosclerotic vessel so new blood flow can be established.  Atherectomy. Atherectomy is a procedure that uses a catheter with a sharp blade to remove plaque from an artery. A chamber in the catheter collects the plaque.  Endarterectomy. An endarterectomy is a surgical procedure where a surgeon removes plaque from an artery.  Amputation. When blockages in the lower legs are very severe and circulation cannot be restored, amputation may be required. SEEK IMMEDIATE MEDICAL CARE IF:  You are having heart attack symptoms, such as:  Chest pain or pain that radiates to the neck, arm, jaw, or in the upper, middle back (mid-scapular pain).  Shortness of breath without cause.  Profuse sweating while at rest.  Irregular heartbeats.  Nausea or gastrointestinal upset.  You are having stroke symptoms, such as sudden:  Numbness or weakness to one side of your body, such as the face, arm, or leg.  Confusion or trouble speaking or  understanding.  Trouble seeing out of one or both eyes.  Trouble walking, loss of balance, or dizziness.  Severe headache with no known cause.  Your hands or feet are bluish, cold, or you have pain in them.  You have bad abdominal pain after eating. Symptoms of heart attack or stroke may represent a serious problem that is an emergency. Do not wait to see if the symptoms will go away. Get medical help right away. Call your local emergency services (911 in the [place] Do not drive yourself to the hospital. Document Released: 10/01/2003 Document Revised: 11/25/2013 Document Reviewed: 09/13/2011 Exit[employer] Patient Information 2015 [employer]. This information is not intended to replace advice given to you by your health care provider. Make sure you discuss any questions you have with your health care provider.  

## 2014-05-22 NOTE — ED Notes (Signed)
MD at bedside. 

## 2014-05-22 NOTE — ED Notes (Signed)
Reports episode of dizziness and near syncope with nausea/vomiting.  Hx of emphysema. Endarterectomy of the right carotid artery.

## 2014-05-22 NOTE — Discharge Instructions (Signed)
Please report directly to [facility] Emergency Room for evaluation.

## 2014-05-23 ENCOUNTER — Telehealth: Payer: Self-pay | Admitting: Surgery

## 2014-05-23 NOTE — Telephone Encounter (Addendum)
Message copied by Gena Fray on Fri May 23, 2014  2:35 PM ------      Message from: Denman George      Created: Fri May 23, 2014 12:47 PM      Regarding: RE: Appt?      Contact: [phone]       No additional studies for our evaluation, as he had a carotid duplex @ [facility] yesterday showing 1-39 % stenosis, bilaterally.  Per ED note, "pt. to f/u with PCP".  We could see him, but he really needs eval from medical standpoint, as many things can cause dizziness.            ----- Message -----         From: Gena Fray         Sent: 05/23/2014  10:47 AM           To: Lynetta Mare Pullins, RN      Subject: Appt?                                                    Arbie Cookey,            Mr Whetsel went to the ER last night with a "dizzy spell". He has had carotid surgery by [facility]. He feels that he needs to follow up. He had a CT of the head at the ER, would we need to do any additional testing?            Thanks!      Dana       ------  05/23/14: patient notified

## 2015-04-16 ENCOUNTER — Encounter: Payer: Self-pay | Admitting: Internal Medicine

## 2015-05-22 ENCOUNTER — Ambulatory Visit (AMBULATORY_SURGERY_CENTER): Payer: Self-pay

## 2015-05-22 VITALS — Ht 69.0 in | Wt 125.6 lb

## 2015-05-22 DIAGNOSIS — Z1211 Encounter for screening for malignant neoplasm of colon: Secondary | ICD-10-CM

## 2015-05-22 NOTE — Progress Notes (Signed)
No allergies to eggs or soy No diet/weight loss meds No home oxygen No past problems with anesthesia  Has internet but doesn't use

## 2015-06-05 ENCOUNTER — Encounter: Payer: Self-pay | Admitting: Internal Medicine

## 2015-06-08 ENCOUNTER — Encounter: Payer: Self-pay | Admitting: Internal Medicine

## 2015-06-15 ENCOUNTER — Encounter: Payer: Self-pay | Admitting: Internal Medicine

## 2015-06-15 ENCOUNTER — Ambulatory Visit (AMBULATORY_SURGERY_CENTER): Payer: BLUE CROSS/BLUE SHIELD | Admitting: Internal Medicine

## 2015-06-15 VITALS — BP 116/71 | HR 59 | Temp 96.7°F | Resp 22 | Ht 69.0 in | Wt 125.0 lb

## 2015-06-15 DIAGNOSIS — Z1211 Encounter for screening for malignant neoplasm of colon: Secondary | ICD-10-CM

## 2015-06-15 DIAGNOSIS — K621 Rectal polyp: Secondary | ICD-10-CM | POA: Diagnosis not present

## 2015-06-15 DIAGNOSIS — D128 Benign neoplasm of rectum: Secondary | ICD-10-CM

## 2015-06-15 DIAGNOSIS — D125 Benign neoplasm of sigmoid colon: Secondary | ICD-10-CM | POA: Diagnosis not present

## 2015-06-15 DIAGNOSIS — D129 Benign neoplasm of anus and anal canal: Secondary | ICD-10-CM

## 2015-06-15 MED ORDER — SODIUM CHLORIDE 0.9 % IV SOLN: 500.0000 mL | INTRAVENOUS | Status: DC

## 2015-06-15 NOTE — Progress Notes (Signed)
To recovery, report to [place], RN, [facility] 

## 2015-06-15 NOTE — Progress Notes (Signed)
Called to room to assist during endoscopic procedure.  Patient ID and intended procedure confirmed with present staff. Received instructions for my participation in the procedure from the performing physician.  

## 2015-06-15 NOTE — Op Note (Signed)
[place]  [address] [place], [id number]   COLONOSCOPY PROCEDURE REPORT  PATIENT: Jackson Mathews, Jackson Mathews  MR#: [id number] BIRTHDATE: Nov 14, 1955 , 11  yrs. old GENDER: male ENDOSCOPIST: Gatha Mayer, MD, [facility] PROCEDURE DATE:  06/15/2015 PROCEDURE:   Colonoscopy, screening and Colonoscopy with snare polypectomy First Screening Colonoscopy - Avg.  risk and is 50 yrs.  old or older Yes.  Prior Negative Screening - Now for repeat screening. N/A  History of Adenoma - Now for follow-up colonoscopy & has been > or = to 3 yrs.  N/A  Polyps removed today? Yes ASA CLASS:   Class II INDICATIONS:Screening for colonic neoplasia and Colorectal Neoplasm Risk Assessment for this procedure is average risk. MEDICATIONS: Propofol 240 mg IV and Monitored anesthesia care  DESCRIPTION OF PROCEDURE:   After the risks benefits and alternatives of the procedure were thoroughly explained, informed consent was obtained.  The digital rectal exam revealed no abnormalities of the rectum, revealed no prostatic nodules, and revealed the prostate was not enlarged.   The LB [id number] endoscope was introduced through the anus and advanced to the cecum, which was identified by both the appendix and ileocecal valve. No adverse events experienced.   The quality of the prep was excellent.  (MiraLax was used)  The instrument was then slowly withdrawn as the colon was fully examined. Estimated blood loss is zero unless otherwise noted in this procedure report.      COLON FINDINGS: Two polypoid shaped sessile polyps ranging from 4 to 5mm in size were found in the rectum and sigmoid colon. Otherwise normal colon and rectum. Retroflexed views revealed no abnormalities. The time to cecum = 2.0 Withdrawal time = 12.0   The scope was withdrawn and the procedure completed. COMPLICATIONS: There were no immediate complications.  ENDOSCOPIC IMPRESSION: Two sessile polyps ranging from 4 to 74mm in  size were found in the rectum and sigmoid colon otherwise normal excellent prep first screen.  RECOMMENDATIONS: Timing of repeat colonoscopy will be determined by pathology findings.  eSigned:  Gatha Mayer, MD, [facility] 06/15/2015 3:01 PM   cc: Dr. Teresa Coombs and The Patient

## 2015-06-15 NOTE — Patient Instructions (Signed)
YOU HAD AN ENDOSCOPIC PROCEDURE TODAY AT THE [facility]:   Refer to the procedure report that was given to you for any specific questions about what was found during the examination.  If the procedure report does not answer your questions, please call your gastroenterologist to clarify.  If you requested that your care partner not be given the details of your procedure findings, then the procedure report has been included in a sealed envelope for you to review at your convenience later.  YOU SHOULD EXPECT: Some feelings of bloating in the abdomen. Passage of more gas than usual.  Walking can help get rid of the air that was put into your GI tract during the procedure and reduce the bloating. If you had a lower endoscopy (such as a colonoscopy or flexible sigmoidoscopy) you may notice spotting of blood in your stool or on the toilet paper. If you underwent a bowel prep for your procedure, you may not have a normal bowel movement for a few days.  Please Note:  You might notice some irritation and congestion in your nose or some drainage.  This is from the oxygen used during your procedure.  There is no need for concern and it should clear up in a day or so.  SYMPTOMS TO REPORT IMMEDIATELY:   Following lower endoscopy (colonoscopy or flexible sigmoidoscopy):  Excessive amounts of blood in the stool  Significant tenderness or worsening of abdominal pains  Swelling of the abdomen that is new, acute  Fever of 100F or higher    For urgent or emergent issues, a gastroenterologist can be reached at any hour by calling [phone].   DIET: Your first meal following the procedure should be a small meal and then it is ok to progress to your normal diet. Heavy or fried foods are harder to digest and may make you feel nauseous or bloated.  Likewise, meals heavy in dairy and vegetables can increase bloating.  Drink plenty of fluids but you should avoid alcoholic beverages for 24  hours.  ACTIVITY:  You should plan to take it easy for the rest of today and you should NOT DRIVE or use heavy machinery until tomorrow (because of the sedation medicines used during the test).    FOLLOW UP: Our staff will call the number listed on your records the next business day following your procedure to check on you and address any questions or concerns that you may have regarding the information given to you following your procedure. If we do not reach you, we will leave a message.  However, if you are feeling well and you are not experiencing any problems, there is no need to return our call.  We will assume that you have returned to your regular daily activities without incident.  If any biopsies were taken you will be contacted by phone or by letter within the next 1-3 weeks.  Please call us at [phone] if you have not heard about the biopsies in 3 weeks.    SIGNATURES/CONFIDENTIALITY: You and/or your care partner have signed paperwork which will be entered into your electronic medical record.  These signatures attest to the fact that that the information above on your After Visit Summary has been reviewed and is understood.  Full responsibility of the confidentiality of this discharge information lies with you and/or your care-partner.   Information on polyps given to you today 

## 2015-06-16 ENCOUNTER — Telehealth: Payer: Self-pay | Admitting: *Deleted

## 2015-06-16 NOTE — Telephone Encounter (Signed)
  Follow up Call-  Call back number 06/15/2015  Post procedure Call Back phone  # [phone]  Permission to leave phone message Yes     Patient questions:  Do you have a fever, pain , or abdominal swelling? No. Pain Score  0 *  Have you tolerated food without any problems? Yes.    Have you been able to return to your normal activities? Yes.    Do you have any questions about your discharge instructions: Diet   No. Medications  No. Follow up visit  No.  Do you have questions or concerns about your Care? No.  Actions: * If pain score is 4 or above: No action needed, pain <4.

## 2015-06-21 ENCOUNTER — Encounter: Payer: Self-pay | Admitting: Internal Medicine

## 2015-06-21 DIAGNOSIS — Z860100 Personal history of colon polyps, unspecified: Secondary | ICD-10-CM

## 2015-06-21 HISTORY — DX: Personal history of colonic polyps: Z86.010

## 2015-06-21 HISTORY — DX: Personal history of colon polyps, unspecified: Z86.0100

## 2015-06-21 NOTE — Progress Notes (Signed)
Quick Note:  Diminutive ssp - repeat colonoscopy 2021 ______ 

## 2015-09-23 ENCOUNTER — Encounter (HOSPITAL_COMMUNITY): Payer: Self-pay | Admitting: Cardiology

## 2015-09-23 ENCOUNTER — Observation Stay (HOSPITAL_COMMUNITY)
Admission: EM | Admit: 2015-09-23 | Discharge: 2015-09-24 | Disposition: A | Discharge status: Home / Self Care | Payer: BLUE CROSS/BLUE SHIELD | Attending: Cardiology | Admitting: Cardiology

## 2015-09-23 ENCOUNTER — Emergency Department (HOSPITAL_COMMUNITY): Payer: BLUE CROSS/BLUE SHIELD

## 2015-09-23 DIAGNOSIS — J439 Emphysema, unspecified: Secondary | ICD-10-CM | POA: Diagnosis not present

## 2015-09-23 DIAGNOSIS — I25110 Atherosclerotic heart disease of native coronary artery with unstable angina pectoris: Principal | ICD-10-CM | POA: Insufficient documentation

## 2015-09-23 DIAGNOSIS — Z720 Tobacco use: Secondary | ICD-10-CM

## 2015-09-23 DIAGNOSIS — I2510 Atherosclerotic heart disease of native coronary artery without angina pectoris: Secondary | ICD-10-CM | POA: Diagnosis present

## 2015-09-23 DIAGNOSIS — I10 Essential (primary) hypertension: Secondary | ICD-10-CM | POA: Insufficient documentation

## 2015-09-23 DIAGNOSIS — R0989 Other specified symptoms and signs involving the circulatory and respiratory systems: Secondary | ICD-10-CM | POA: Diagnosis not present

## 2015-09-23 DIAGNOSIS — I739 Peripheral vascular disease, unspecified: Secondary | ICD-10-CM | POA: Diagnosis present

## 2015-09-23 DIAGNOSIS — E7800 Pure hypercholesterolemia, unspecified: Secondary | ICD-10-CM | POA: Diagnosis not present

## 2015-09-23 DIAGNOSIS — E785 Hyperlipidemia, unspecified: Secondary | ICD-10-CM | POA: Diagnosis not present

## 2015-09-23 DIAGNOSIS — I200 Unstable angina: Secondary | ICD-10-CM

## 2015-09-23 DIAGNOSIS — I779 Disorder of arteries and arterioles, unspecified: Secondary | ICD-10-CM | POA: Diagnosis not present

## 2015-09-23 DIAGNOSIS — R072 Precordial pain: Secondary | ICD-10-CM | POA: Diagnosis present

## 2015-09-23 DIAGNOSIS — F17210 Nicotine dependence, cigarettes, uncomplicated: Secondary | ICD-10-CM | POA: Insufficient documentation

## 2015-09-23 DIAGNOSIS — Z9889 Other specified postprocedural states: Secondary | ICD-10-CM | POA: Diagnosis not present

## 2015-09-23 DIAGNOSIS — F17200 Nicotine dependence, unspecified, uncomplicated: Secondary | ICD-10-CM | POA: Diagnosis present

## 2015-09-23 DIAGNOSIS — Z7982 Long term (current) use of aspirin: Secondary | ICD-10-CM | POA: Diagnosis not present

## 2015-09-23 HISTORY — DX: Tobacco use: Z72.0

## 2015-09-23 HISTORY — DX: Other specified postprocedural states: Z98.890

## 2015-09-23 HISTORY — DX: Atherosclerotic heart disease of native coronary artery without angina pectoris: I25.10

## 2015-09-23 LAB — I-STAT TROPONIN, ED: Troponin i, poc: 0 ng/mL (ref 0.00–0.08)

## 2015-09-23 LAB — CBC
HEMATOCRIT: 46.8 % (ref 39.0–52.0)
Hemoglobin: 15.9 g/dL (ref 13.0–17.0)
MCH: 30.6 pg (ref 26.0–34.0)
MCHC: 34 g/dL (ref 30.0–36.0)
MCV: 90 fL (ref 78.0–100.0)
Platelets: 219 10*3/uL (ref 150–400)
RBC: 5.2 MIL/uL (ref 4.22–5.81)
RDW: 13.1 % (ref 11.5–15.5)
WBC: 4.8 10*3/uL (ref 4.0–10.5)

## 2015-09-23 LAB — BASIC METABOLIC PANEL
Anion gap: 11 (ref 5–15)
BUN: <5 mg/dL — ABNORMAL LOW (ref 6–20)
CHLORIDE: 107 mmol/L (ref 101–111)
CO2: 24 mmol/L (ref 22–32)
CREATININE: 0.91 mg/dL (ref 0.61–1.24)
Calcium: 9.2 mg/dL (ref 8.9–10.3)
GFR calc non Af Amer: >60 mL/min (ref >60)
Glucose, Bld: 172 mg/dL — ABNORMAL HIGH (ref 65–99)
POTASSIUM: 4.3 mmol/L (ref 3.5–5.1)
SODIUM: 142 mmol/L (ref 135–145)

## 2015-09-23 LAB — TROPONIN I: Troponin I: <0.03 ng/mL (ref <0.031)

## 2015-09-23 LAB — TSH: TSH: 4.492 u[IU]/mL (ref 0.350–4.500)

## 2015-09-23 MED ORDER — ASPIRIN 81 MG PO CHEW
243.0000 mg | CHEWABLE_TABLET | Freq: Once | ORAL | Status: AC
  Administered 2015-09-23: 243 mg via ORAL
  Filled 2015-09-23: qty 3

## 2015-09-23 MED ORDER — NITROGLYCERIN 2 % TD OINT
1.0000 [in_us] | TOPICAL_OINTMENT | Freq: Once | TRANSDERMAL | Status: AC
  Administered 2015-09-23: 1 [in_us] via TOPICAL
  Filled 2015-09-23: qty 1

## 2015-09-23 MED ORDER — LOSARTAN POTASSIUM 25 MG PO TABS
25.0000 mg | ORAL_TABLET | Freq: Every day | ORAL | Status: DC
  Administered 2015-09-24: 25 mg via ORAL
  Filled 2015-09-23: qty 1

## 2015-09-23 MED ORDER — HEPARIN BOLUS VIA INFUSION
3000.0000 [IU] | Freq: Once | INTRAVENOUS | Status: AC
  Administered 2015-09-23: 3000 [IU] via INTRAVENOUS
  Filled 2015-09-23: qty 3000

## 2015-09-23 MED ORDER — HEPARIN (PORCINE) IN NACL 100-0.45 UNIT/ML-% IJ SOLN
900.0000 [IU]/h | INTRAMUSCULAR | Status: DC
  Administered 2015-09-23: 700 [IU]/h via INTRAVENOUS
  Filled 2015-09-23: qty 250

## 2015-09-23 MED ORDER — ONDANSETRON HCL 4 MG/2ML IJ SOLN: 4.0000 mg | Freq: Four times a day (QID) | INTRAMUSCULAR | Status: DC | PRN

## 2015-09-23 MED ORDER — ASPIRIN EC 81 MG PO TBEC
81.0000 mg | DELAYED_RELEASE_TABLET | Freq: Every day | ORAL | Status: DC
  Administered 2015-09-24: 81 mg via ORAL
  Filled 2015-09-23: qty 1

## 2015-09-23 MED ORDER — NITROGLYCERIN 0.4 MG SL SUBL: 0.4000 mg | SUBLINGUAL_TABLET | SUBLINGUAL | Status: DC | PRN

## 2015-09-23 MED ORDER — ATORVASTATIN CALCIUM 80 MG PO TABS
80.0000 mg | ORAL_TABLET | Freq: Every day | ORAL | Status: DC
  Administered 2015-09-23 – 2015-09-24 (×2): 80 mg via ORAL
  Filled 2015-09-23 (×2): qty 1

## 2015-09-23 MED ORDER — ACETAMINOPHEN 325 MG PO TABS: 650.0000 mg | ORAL_TABLET | ORAL | Status: DC | PRN

## 2015-09-23 MED ORDER — METOPROLOL TARTRATE 12.5 MG HALF TABLET
12.5000 mg | ORAL_TABLET | Freq: Two times a day (BID) | ORAL | Status: DC
  Administered 2015-09-23 – 2015-09-24 (×2): 12.5 mg via ORAL
  Filled 2015-09-23 (×2): qty 1

## 2015-09-23 NOTE — ED Provider Notes (Signed)
CSN: [id number]     Arrival date & time 09/23/15  1247 History   First MD Initiated Contact with Patient 09/23/15 1610     Chief Complaint  Patient presents with  . Chest Pain     (Consider location/radiation/quality/duration/timing/severity/associated sxs/prior Treatment) HPI Complete of anterior chest pain described as dull and pressure-like onset 2:15 AM yesterday morning lasted 40 minutes symptoms accompanied by lightheadedness and nausea and sweatiness and mild shortness of breath. Onset at rest. He saw his primary care physician this morning who sent him here. He was treated with 1 baby aspirin this morning. Presently pain free. No other associated symptoms. Past Medical History  Diagnosis Date  . Palpitations   . Lower back pain   . Hearing loss   . Dyspnea   . Calf pain   . Chest pain   . Tinnitus   . Vertigo   . Cancer (HCC)     Skin  . Carotid artery occlusion   . Hypertension   . Hypercholesteremia   . Hx of colonic polyp - ssp 06/21/2015   Past Surgical History  Procedure Laterality Date  . Skin cancer excision    . Melanoma excision      l eye  . Carotid endarterectomy  05/05/11    Right CEA   Family History  Problem Relation Age of Onset  . Heart disease Mother   . Cancer Father   . Diabetes Brother   . Colon cancer Neg Hx   . Esophageal cancer Neg Hx   . Ulcerative colitis Neg Hx   . Stomach cancer Neg Hx   . Rectal cancer Neg Hx    Social History  Substance Use Topics  . Smoking status: Current Every Day Smoker -- 1.00 packs/day for 30 years    Types: Cigarettes  . Smokeless tobacco: Never Used  . Alcohol Use: No    Review of Systems  Constitutional: Positive for diaphoresis.  HENT: Negative.   Respiratory: Positive for shortness of breath.   Cardiovascular: Positive for chest pain.  Gastrointestinal: Positive for nausea.  Musculoskeletal: Negative.   Skin: Negative.   Neurological: Negative.   Psychiatric/Behavioral: Negative.   All  other systems reviewed and are negative.     Allergies  Codeine  Home Medications   Prior to Admission medications   Medication Sig Start Date End Date Taking? Authorizing Provider  aspirin 81 MG chewable tablet Chew 4 tablets (324 mg total) by mouth daily. Patient taking differently: Chew 81 mg by mouth daily as needed for mild pain or moderate pain.  05/22/14  Yes Dorie Rank, MD  atorvastatin (LIPITOR) 20 MG tablet daily. 04/21/15  Yes Historical Provider, MD  losartan (COZAAR) 25 MG tablet daily. 04/24/15   Historical Provider, MD   BP 171/74 mmHg  Pulse 79  Temp(Src) 97.8 F (36.6 C) (Oral)  Resp 16  Wt 125 lb (56.7 kg)  SpO2 97% Physical Exam  Constitutional: He appears well-developed and well-nourished.  HENT:  Head: Normocephalic and atraumatic.  Eyes: Conjunctivae are normal. Pupils are equal, round, and reactive to light.  Neck: Neck supple. No tracheal deviation present. No thyromegaly present.  Cardiovascular: Normal rate and regular rhythm.   No murmur heard. Pulmonary/Chest: Effort normal and breath sounds normal.  Abdominal: Soft. Bowel sounds are normal. He exhibits no distension. There is no tenderness.  Musculoskeletal: Normal range of motion. He exhibits no edema or tenderness.  Neurological: He is alert. Coordination normal.  Skin: Skin is warm and dry. No  rash noted.  Psychiatric: He has a normal mood and affect.  Nursing note and vitals reviewed.   ED Course  Procedures (including critical care time) Labs Review Labs Reviewed  BASIC METABOLIC PANEL - Abnormal; Notable for the following:    Glucose, Bld 172 (*)    BUN <5 (*)    All other components within normal limits  CBC  I-STAT TROPOININ, ED    Imaging Review Dg Chest 2 View  09/23/2015  CLINICAL DATA:  Left-sided chest pain. Diaphoresis. Symptoms began yesterday. EXAM: CHEST  2 VIEW COMPARISON:  05/22/2014 FINDINGS: Heart size is normal. Mediastinal shadows are normal. The lungs are  hyperinflated and consistent with emphysema. No evidence of infiltrate, collapse or effusion. No acute bone finding. IMPRESSION: Emphysema.  No active disease otherwise. Electronically Signed   By: Nelson Chimes M.D.   On: 09/23/2015 13:19   I have personally reviewed and evaluated these images and lab results as part of my medical decision-making.   EKG Interpretation   Date/Time:  Wednesday September 23 2015 12:49:59 EST Ventricular Rate:  94 PR Interval:  122 QRS Duration: 90 QT Interval:  332 QTC Calculation: 415 R Axis:   85 Text Interpretation:  Sinus rhythm with Premature atrial complexes Right  atrial enlargement ST \\T \ T wave abnormality, consider inferior ischemia  Abnormal ECG similar to ekg from oct 2012 st depression, < 2 mm in  inferior leads. Confirmed by Gerald Leitz ([id number]) on 09/23/2015  12:55:08 PM     Chest x-ray viewed by me Results for orders placed or performed during the hospital encounter of [id number]  Basic metabolic panel  Result Value Ref Range   Sodium 142 135 - 145 mmol/L   Potassium 4.3 3.5 - 5.1 mmol/L   Chloride 107 101 - 111 mmol/L   CO2 24 22 - 32 mmol/L   Glucose, Bld 172 (H) 65 - 99 mg/dL   BUN <5 (L) 6 - 20 mg/dL   Creatinine, Ser 0.91 0.61 - 1.24 mg/dL   Calcium 9.2 8.9 - 10.3 mg/dL   GFR calc non Af Amer >60 >60 mL/min   GFR calc Af Amer >60 >60 mL/min   Anion gap 11 5 - 15  CBC  Result Value Ref Range   WBC 4.8 4.0 - 10.5 K/uL   RBC 5.20 4.22 - 5.81 MIL/uL   Hemoglobin 15.9 13.0 - 17.0 g/dL   HCT 46.8 39.0 - 52.0 %   MCV 90.0 78.0 - 100.0 fL   MCH 30.6 26.0 - 34.0 pg   MCHC 34.0 30.0 - 36.0 g/dL   RDW 13.1 11.5 - 15.5 %   Platelets 219 150 - 400 K/uL  I-stat troponin, ED (not at [facility])  Result Value Ref Range   Troponin i, poc 0.00 0.00 - 0.08 ng/mL   Comment 3           Dg Chest 2 View  09/23/2015  CLINICAL DATA:  Left-sided chest pain. Diaphoresis. Symptoms began yesterday. EXAM: CHEST  2 VIEW COMPARISON:  05/22/2014  FINDINGS: Heart size is normal. Mediastinal shadows are normal. The lungs are hyperinflated and consistent with emphysema. No evidence of infiltrate, collapse or effusion. No acute bone finding. IMPRESSION: Emphysema.  No active disease otherwise. Electronically Signed   By: Nelson Chimes M.D.   On: 09/23/2015 13:19    MDM  Heart risk factors include smoking, hypertension, family history, hypercholesterolemia. Heart score equals 7 storyage,riskfactors,EKGcriteria Final diagnoses:  None  Dr.Skains interviewed patient and made arrangements  for admission  Dx #1 unstable angina #2 hyperglycemia #3 tobacco abuse     Orlie Dakin, MD 09/23/15 1818

## 2015-09-23 NOTE — Progress Notes (Signed)
Patient received into 2w06 from the ED alert and oriented. On arrival vital signs checked stable. Oriented to the room and placed on cardiac monitor.Safety measures put in placed and call bell placed within reach. Will keep monitoring.

## 2015-09-23 NOTE — ED Notes (Signed)
Pt reports he started having chest pain on Tuesday and has continued intermittently since then. Was at PCP office today and told to come here for further evaluation. Reports some SOB this morning.

## 2015-09-23 NOTE — Progress Notes (Signed)
ANTICOAGULATION CONSULT NOTE - Initial Consult  Pharmacy Consult for Heparin Indication: chest pain/ACS  Allergies  Allergen Reactions  . Codeine     Nausea     Patient Measurements: Height: 5\' 9"  (175.3 cm) Weight: 125 lb 10.6 oz (57 kg) IBW/kg (Calculated) : 70.7 Heparin Dosing Weight: 57 kg  Vital Signs: Temp: 98.3 F (36.8 C) (03/01 2207) Temp Source: Oral (03/01 2207) BP: 114/64 mmHg (03/01 2207) Pulse Rate: 75 (03/01 2207)  Labs:  Recent Labs  09/23/15 1311  HGB 15.9  HCT 46.8  PLT 219  CREATININE 0.91    Estimated Creatinine Clearance: 71.3 mL/min (by C-G formula based on Cr of 0.91).   Medical History: Past Medical History  Diagnosis Date  . Palpitations   . Lower back pain   . Hearing loss   . Dyspnea   . Calf pain   . Chest pain   . Tinnitus   . Vertigo   . Cancer (HCC)     Skin  . Carotid artery occlusion   . Hypertension   . Hypercholesteremia   . Hx of colonic polyp - ssp 06/21/2015  . History of right-sided carotid endarterectomy 09/23/2015  . Tobacco use 09/23/2015    Medications:  Prescriptions prior to admission  Medication Sig Dispense Refill Last Dose  . atorvastatin (LIPITOR) 20 MG tablet Take 20 mg by mouth daily.   3 1 week  . losartan (COZAAR) 25 MG tablet Take 25 mg by mouth daily.   3 1 week   Scheduled:  . [START ON 09/24/2015] aspirin EC  81 mg Oral Daily  . atorvastatin  80 mg Oral q1800  . [START ON 09/24/2015] losartan  25 mg Oral Daily  . metoprolol tartrate  12.5 mg Oral BID    Assessment: Jackson Mathews is a thin 60 year old Caucasian male with PMH of CAD s/p right CEA in 2012. Presented with chest pain concerning for unstable angina. Cardiac risk factors: age, family hx of early CAD, smoking 2 PPD for 35 years, hypertension and hyperlipidemia. Plan for cardiac cath tomorrow. Pharmacy consulted to dose heparin.  Goal of Therapy:  Heparin level 0.3-0.7 units/ml Monitor platelets by anticoagulation protocol: Yes    Plan:  Give 3000 units bolus x 1 Start heparin infusion at 700 units/hr Check anti-Xa level in 6 hours and daily while on heparin Continue to monitor H&H and platelets  Thank you for allowing [place] to participate in this patients care. Jens Som, PharmD Pager: [phone] 09/23/2015,10:19 PM

## 2015-09-23 NOTE — H&P (Signed)
Patient ID: Jackson Mathews MRN: [id number], DOB/AGE: 1956-07-18   Admit date: 09/23/2015   Primary Physician: Teresa Coombs, DO Primary Cardiologist: new (remote seen Dr. Johnsie Cancel in 2012/2013)  Pt. Profile:  Jackson Mathews is a thin 60 year old Caucasian male with past medical history of coronary artery disease s/p right CEA in 2012, emphysema, chronic tobacco abuse, hypertension and hyperlipidemia presented with chest pain the day before.  Problem List  Past Medical History  Diagnosis Date  . Palpitations   . Lower back pain   . Hearing loss   . Dyspnea   . Calf pain   . Chest pain   . Tinnitus   . Vertigo   . Cancer (HCC)     Skin  . Carotid artery occlusion   . Hypertension   . Hypercholesteremia   . Hx of colonic polyp - ssp 06/21/2015    Past Surgical History  Procedure Laterality Date  . Skin cancer excision    . Melanoma excision      l eye  . Carotid endarterectomy  05/05/11    Right CEA     Allergies  Allergies  Allergen Reactions  . Codeine     Nausea     HPI  Jackson Mathews is a thin 60 year old Caucasian male with past medical history of coronary artery disease s/p right CEA in 2012, emphysema, chronic tobacco abuse, hypertension and hyperlipidemia. He was seen by Dr. Johnsie Cancel in 2012 for evaluation of nausea, vomiting, and chest pain. He underwent ETT without significant abnormality. Despite calf pain at the time, he had normal ABIs. He has since had right CEA by Dr. Trula Slade in October 2012. Last carotid ultrasound on 05/22/2014 showed 1-39% ICA stenosis with severe left ECA stenosis. He has not seen Dr. Johnsie Cancel since his last visit in 2012/2013. Since his carotid surgery, he is occasional dizzy spell has significantly improved.  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 He states he worked on 100 parts on Monday 09/21/2015 without significant discomfort. Tuesday morning around 2:15 AM, he had significant epigastric and lower  substernal pressure describing as an elephant sitting on the chest. The symptom was also associated with clamminess, significant diaphoresis, and shortness of breath. The worst symptom lasted roughly 8-9 minutes before subsiding. The symptoms eventually went away after roughly 40 minutes. He went to sleep, upon wakening up in the morning of 2/28, he continued to feel weak without energy. He took the day off from work. He called his PCPs office who advised him to follow-up on the following day on 3/1. He was seen by his PCP today and sent to [facility] for further evaluation of chest pain. Initial EKG shows mild ST downsloping in inferolateral leads. As this time, he has a point tenderness on the left flank of the chest which has been persistent since yesterday, this is different from the chest pressure he felt yesterday morning. Initial troponin was negative. CBC and BMP was normal. Chest x-ray is negative for acute process. Cardiology has been consulted for chest pain.    Home Medications  Prior to Admission medications   Medication Sig Start Date End Date Taking? Authorizing Provider  aspirin 81 MG chewable tablet Chew 4 tablets (324 mg total) by mouth daily. Patient taking differently: Chew 81 mg by mouth daily as needed for mild pain or moderate pain.  05/22/14  Yes Dorie Rank, MD  atorvastatin (LIPITOR) 20 MG tablet Take 20 mg by mouth daily.  04/21/15  Yes Historical Provider, MD  losartan (COZAAR) 25 MG tablet Take 25 mg by mouth daily.  04/24/15  Yes Historical Provider, MD    Family History  Family History  Problem Relation Age of Onset  . Heart disease Mother   . Cancer Father   . Diabetes Brother   . Colon cancer Neg Hx   . Esophageal cancer Neg Hx   . Ulcerative colitis Neg Hx   . Stomach cancer Neg Hx   . Rectal cancer Neg Hx   . Heart attack Father     s/p CABG    Social History  Social History   Social History  . Marital Status: Single    Spouse Name: N/A  .  Number of Children: N/A  . Years of Education: N/A   Occupational History  . Not on file.   Social History Main Topics  . Smoking status: Current Every Day Smoker -- 1.00 packs/day for 30 years    Types: Cigarettes  . Smokeless tobacco: Never Used     Comment: 2 ppd for 35 years as of 2017  . Alcohol Use: No  . Drug Use: No  . Sexual Activity: Not on file   Other Topics Concern  . Not on file   Social History Narrative     Review of Systems General:  No chills, fever, night sweats or weight changes.  Cardiovascular:  No dyspnea on exertion, edema, orthopnea, palpitations, paroxysmal nocturnal dyspnea. +chest pain, diaphoresis, claminess Dermatological: No rash, lesions/masses Respiratory: No cough, dyspnea Urologic: No hematuria, dysuria Abdominal:   No nausea, vomiting, diarrhea, bright red blood per rectum, melena, or hematemesis Neurologic:  No visual changes, changes in mental status. +wkns All other systems reviewed and are otherwise negative except as noted above.  Physical Exam  Blood pressure 156/88, pulse 70, temperature 97.8 F (36.6 C), temperature source Oral, resp. rate 18, weight 125 lb (56.7 kg), SpO2 99 %.  General: Pleasant, NAD Psych: Normal affect. Neuro: Alert and oriented X 3. Moves all extremities spontaneously. HEENT: Normal  Neck: Supple without bruits or JVD. Lungs:  Resp regular and unlabored, CTA. Heart: RRR no s3, s4, or murmurs. Abdomen: Soft, non-tender, non-distended, BS + x 4.  Extremities: No clubbing, cyanosis or edema. DP/PT/Radials 2+ and equal bilaterally.  Labs  Troponin [facility] of Care Test)  Recent Labs  09/23/15 1314  TROPIPOC 0.00   No results for input(s): CKTOTAL, CKMB, TROPONINI in the last 72 hours. Lab Results  Component Value Date   WBC 4.8 09/23/2015   HGB 15.9 09/23/2015   HCT 46.8 09/23/2015   MCV 90.0 09/23/2015   PLT 219 09/23/2015     Recent Labs Lab 09/23/15 1311  NA 142  K 4.3  CL 107  CO2 24    BUN <5*  CREATININE 0.91  CALCIUM 9.2  GLUCOSE 172*     Radiology/Studies  Dg Chest 2 View  09/23/2015  CLINICAL DATA:  Left-sided chest pain. Diaphoresis. Symptoms began yesterday. EXAM: CHEST  2 VIEW COMPARISON:  05/22/2014 FINDINGS: Heart size is normal. Mediastinal shadows are normal. The lungs are hyperinflated and consistent with emphysema. No evidence of infiltrate, collapse or effusion. No acute bone finding. IMPRESSION: Emphysema.  No active disease otherwise. Electronically Signed   By: Nelson Chimes M.D.   On: 09/23/2015 13:19    ECG  Normal sinus rhythm mild ST downsloping in the inferior lead and V5-V6   ASSESSMENT AND PLAN  1. Chest pain concerning for unstable angina  -  Cardiac risk factors age, family history of early CAD, smoking 2 packs per day for 35 years, hypertension and hyperlipidemia  - EKG showed mild downslopping of inferior leads and V5-V6  - will discuss with MD regarding potential cath vs coronary CTA  - serial trop, check lipid panel and Hgb A1C. Echocardiogram  2. Uncontrolled hypertension  - uncontrolled with SBP 150s  3. HLD  4. Tobacco abuse  5. Carotid artery disease s/p R CEA by Dr. Trula Slade in 04/2011  - significant L carotid bruit on exam, will repeat carotid U/S  6. History of emphysema    Signed, Jackson Mathews, Jackson Mathews 09/23/2015, 6:08 PM  Personally seen and examined. Agree with above.  60 year old male with unstable angina, peripheral vascular disease status post carotid endarterectomy, right, left sided bruit possibly from external carotid artery, significant tobacco use, mother with heart attack at age 12, father with bypass at later age with abnormal EKG, nonspecific/possible ischemic changes in the inferior leads.  Unstable angina -Cardiac catheterization tomorrow. Risks and benefits discussed including stroke, heart attack, death, renal impairment, bleeding. -He wishes to proceed. -We will place on heparin IV, beta blocker,  statin, aspirin -Check echocardiogram   Peripheral vascular disease -Carotid endarterectomy, Dr. Trula Slade -Check carotid ultrasound, left bruit fairly significant, likely from external carotid artery  Tobacco use -Tobacco cessation  Emphysematous changes on x-ray, hyperinflation.  Thankfully, he is quite fit, works in a Primary school teacher. Thin, regular rate and rhythm, no murmurs, clear to auscultation bilaterally lungs, 2+ radial pulse.  Candee Furbish, MD

## 2015-09-23 NOTE — Progress Notes (Signed)
Risk and benefit of procedure explained to the patient who display clear understanding and agree to proceed.  Discussed with patient possible procedural risk include bleeding, vascular injury, renal injury, arrythmia, MI, stroke and loss of limb or life.  Signed, Annsleigh Dragoo PA Pager: [phone]  

## 2015-09-24 ENCOUNTER — Other Ambulatory Visit: Payer: Self-pay | Admitting: Cardiology

## 2015-09-24 ENCOUNTER — Encounter (HOSPITAL_COMMUNITY)
Admission: EM | Disposition: A | Discharge status: Home / Self Care | Payer: Self-pay | Source: Home / Self Care | Attending: Emergency Medicine

## 2015-09-24 ENCOUNTER — Encounter (HOSPITAL_COMMUNITY): Payer: Self-pay | Admitting: Cardiovascular Disease

## 2015-09-24 ENCOUNTER — Observation Stay (HOSPITAL_BASED_OUTPATIENT_CLINIC_OR_DEPARTMENT_OTHER): Payer: BLUE CROSS/BLUE SHIELD

## 2015-09-24 DIAGNOSIS — R0989 Other specified symptoms and signs involving the circulatory and respiratory systems: Secondary | ICD-10-CM | POA: Diagnosis not present

## 2015-09-24 DIAGNOSIS — I6523 Occlusion and stenosis of bilateral carotid arteries: Secondary | ICD-10-CM

## 2015-09-24 DIAGNOSIS — I2510 Atherosclerotic heart disease of native coronary artery without angina pectoris: Secondary | ICD-10-CM | POA: Diagnosis present

## 2015-09-24 DIAGNOSIS — R072 Precordial pain: Secondary | ICD-10-CM | POA: Diagnosis not present

## 2015-09-24 DIAGNOSIS — Z9889 Other specified postprocedural states: Secondary | ICD-10-CM | POA: Diagnosis not present

## 2015-09-24 DIAGNOSIS — I25110 Atherosclerotic heart disease of native coronary artery with unstable angina pectoris: Secondary | ICD-10-CM | POA: Diagnosis not present

## 2015-09-24 DIAGNOSIS — R079 Chest pain, unspecified: Secondary | ICD-10-CM

## 2015-09-24 DIAGNOSIS — I200 Unstable angina: Secondary | ICD-10-CM | POA: Diagnosis not present

## 2015-09-24 DIAGNOSIS — I739 Peripheral vascular disease, unspecified: Secondary | ICD-10-CM | POA: Diagnosis not present

## 2015-09-24 DIAGNOSIS — I10 Essential (primary) hypertension: Secondary | ICD-10-CM | POA: Diagnosis not present

## 2015-09-24 DIAGNOSIS — E785 Hyperlipidemia, unspecified: Secondary | ICD-10-CM | POA: Diagnosis not present

## 2015-09-24 HISTORY — DX: Atherosclerotic heart disease of native coronary artery without angina pectoris: I25.10

## 2015-09-24 HISTORY — PX: CARDIAC CATHETERIZATION: SHX172

## 2015-09-24 LAB — TROPONIN I
Troponin I: <0.03 ng/mL (ref <0.031)
Troponin I: <0.03 ng/mL (ref <0.031)

## 2015-09-24 LAB — LIPID PANEL
CHOLESTEROL: 147 mg/dL (ref 0–200)
HDL: 33 mg/dL — ABNORMAL LOW (ref >40)
LDL CALC: 93 mg/dL (ref 0–99)
Total CHOL/HDL Ratio: 4.5 RATIO
Triglycerides: 103 mg/dL (ref <150)
VLDL: 21 mg/dL (ref 0–40)

## 2015-09-24 LAB — HEPARIN LEVEL (UNFRACTIONATED): Heparin Unfractionated: 0.15 IU/mL — ABNORMAL LOW (ref 0.30–0.70)

## 2015-09-24 LAB — BASIC METABOLIC PANEL
Anion gap: 8 (ref 5–15)
BUN: 9 mg/dL (ref 6–20)
CO2: 27 mmol/L (ref 22–32)
CREATININE: 0.73 mg/dL (ref 0.61–1.24)
Calcium: 9 mg/dL (ref 8.9–10.3)
Chloride: 105 mmol/L (ref 101–111)
GFR calc Af Amer: >60 mL/min (ref >60)
GFR calc non Af Amer: >60 mL/min (ref >60)
Glucose, Bld: 107 mg/dL — ABNORMAL HIGH (ref 65–99)
Potassium: 3.6 mmol/L (ref 3.5–5.1)
SODIUM: 140 mmol/L (ref 135–145)

## 2015-09-24 LAB — PROTIME-INR
INR: 1.03 (ref 0.00–1.49)
PROTHROMBIN TIME: 13.7 s (ref 11.6–15.2)

## 2015-09-24 SURGERY — LEFT HEART CATH AND CORONARY ANGIOGRAPHY

## 2015-09-24 MED ORDER — SODIUM CHLORIDE 0.9% FLUSH
3.0000 mL | Freq: Two times a day (BID) | INTRAVENOUS | Status: DC
  Administered 2015-09-24: 3 mL via INTRAVENOUS

## 2015-09-24 MED ORDER — FENTANYL CITRATE (PF) 100 MCG/2ML IJ SOLN
INTRAMUSCULAR | Status: DC | PRN
  Administered 2015-09-24: 25 ug via INTRAVENOUS

## 2015-09-24 MED ORDER — HEPARIN (PORCINE) IN NACL 2-0.9 UNIT/ML-% IJ SOLN
INTRAMUSCULAR | Status: AC
  Filled 2015-09-24: qty 1000

## 2015-09-24 MED ORDER — ATORVASTATIN CALCIUM 80 MG PO TABS: 80.0000 mg | ORAL_TABLET | Freq: Every day | ORAL | Status: DC

## 2015-09-24 MED ORDER — SODIUM CHLORIDE 0.9 % IV SOLN: 250.0000 mL | INTRAVENOUS | Status: DC | PRN

## 2015-09-24 MED ORDER — ISOSORBIDE MONONITRATE ER 30 MG PO TB24
30.0000 mg | ORAL_TABLET | Freq: Every day | ORAL | Status: DC
  Administered 2015-09-24: 30 mg via ORAL
  Filled 2015-09-24: qty 1

## 2015-09-24 MED ORDER — VERAPAMIL HCL 2.5 MG/ML IV SOLN
INTRAVENOUS | Status: DC | PRN
  Administered 2015-09-24: 13:00:00 via INTRA_ARTERIAL

## 2015-09-24 MED ORDER — SODIUM CHLORIDE 0.9% FLUSH: 3.0000 mL | INTRAVENOUS | Status: DC | PRN

## 2015-09-24 MED ORDER — HEPARIN SODIUM (PORCINE) 1000 UNIT/ML IJ SOLN
INTRAMUSCULAR | Status: DC | PRN
  Administered 2015-09-24: 3500 [IU] via INTRAVENOUS

## 2015-09-24 MED ORDER — IOHEXOL 350 MG/ML SOLN
INTRAVENOUS | Status: DC | PRN
  Administered 2015-09-24: 70 mL via INTRACARDIAC

## 2015-09-24 MED ORDER — NITROGLYCERIN 0.4 MG SL SUBL: 0.4000 mg | SUBLINGUAL_TABLET | SUBLINGUAL | Status: DC | PRN

## 2015-09-24 MED ORDER — VERAPAMIL HCL 2.5 MG/ML IV SOLN
INTRAVENOUS | Status: AC
  Filled 2015-09-24: qty 2

## 2015-09-24 MED ORDER — SODIUM CHLORIDE 0.9 % WEIGHT BASED INFUSION
3.0000 mL/kg/h | INTRAVENOUS | Status: DC
Start: 2015-09-24 — End: 2015-09-24
  Administered 2015-09-24: 3 mL/kg/h via INTRAVENOUS

## 2015-09-24 MED ORDER — LIDOCAINE HCL (PF) 1 % IJ SOLN
INTRAMUSCULAR | Status: AC
  Filled 2015-09-24: qty 30

## 2015-09-24 MED ORDER — SODIUM CHLORIDE 0.9 % WEIGHT BASED INFUSION
1.0000 mL/kg/h | INTRAVENOUS | Status: DC
  Administered 2015-09-24: 1 mL/kg/h via INTRAVENOUS

## 2015-09-24 MED ORDER — ASPIRIN 81 MG PO TBEC: 81.0000 mg | DELAYED_RELEASE_TABLET | Freq: Every day | ORAL | Status: DC

## 2015-09-24 MED ORDER — MIDAZOLAM HCL 2 MG/2ML IJ SOLN
INTRAMUSCULAR | Status: DC | PRN
  Administered 2015-09-24: 2 mg via INTRAVENOUS

## 2015-09-24 MED ORDER — ISOSORBIDE MONONITRATE ER 30 MG PO TB24: 30.0000 mg | ORAL_TABLET | Freq: Every day | ORAL | Status: DC

## 2015-09-24 MED ORDER — LIDOCAINE HCL (PF) 1 % IJ SOLN
INTRAMUSCULAR | Status: DC | PRN
  Administered 2015-09-24: 5 mL via SUBCUTANEOUS

## 2015-09-24 MED ORDER — MIDAZOLAM HCL 2 MG/2ML IJ SOLN
INTRAMUSCULAR | Status: AC
  Filled 2015-09-24: qty 2

## 2015-09-24 MED ORDER — METOPROLOL TARTRATE 25 MG PO TABS: 12.5000 mg | ORAL_TABLET | Freq: Two times a day (BID) | ORAL | Status: DC

## 2015-09-24 MED ORDER — HEPARIN SODIUM (PORCINE) 1000 UNIT/ML IJ SOLN
INTRAMUSCULAR | Status: AC
  Filled 2015-09-24: qty 1

## 2015-09-24 MED ORDER — FENTANYL CITRATE (PF) 100 MCG/2ML IJ SOLN
INTRAMUSCULAR | Status: AC
  Filled 2015-09-24: qty 2

## 2015-09-24 MED ORDER — SODIUM CHLORIDE 0.9 % IV SOLN
INTRAVENOUS | Status: AC
  Administered 2015-09-24: 14:00:00 via INTRAVENOUS

## 2015-09-24 MED ORDER — ASPIRIN 81 MG PO CHEW
81.0000 mg | CHEWABLE_TABLET | ORAL | Status: AC
  Administered 2015-09-24: 81 mg via ORAL
  Filled 2015-09-24: qty 1

## 2015-09-24 SURGICAL SUPPLY — 11 items

## 2015-09-24 NOTE — Progress Notes (Signed)
Pt discharged per MD order. VSS. IV removed. Tele box removed. Pt belongings taken home with family. Discharge instructions reviewed with pt. Pt verbalized understanding. Pt wheeled to vehicle by nurse tech.  Raliegh Ip RN

## 2015-09-24 NOTE — Progress Notes (Signed)
   Cardiologist: Johnsie Cancel Subjective:  No further CP, no SOB. Awaiting cath  Objective:  Vital Signs in the last 24 hours: Temp:  [97.4 F (36.3 C)-98.3 F (36.8 C)] 97.4 F (36.3 C) (03/02 0555) Pulse Rate:  [64-96] 64 (03/02 0555) Resp:  [15-21] 17 (03/02 0555) BP: (114-171)/(64-88) 120/69 mmHg (03/02 0555) SpO2:  [97 %-99 %] 97 % (03/02 0555) Weight:  [125 lb (56.7 kg)-125 lb 10.6 oz (57 kg)] 125 lb (56.7 kg) (03/02 0418)  Intake/Output from previous day: 03/01 0701 - 03/02 0700 In: 120 [P.O.:120] Out: -    Physical Exam: General: Thin, in no acute distress. Head:  Normocephalic and atraumatic. Left carotid bruit Lungs: Clear to auscultation and percussion. Heart: Normal S1 and S2.  No murmur, rubs or gallops.  Abdomen: soft, non-tender, positive bowel sounds. Extremities: No clubbing or cyanosis. No edema. 2+ radial pulses Neurologic: Alert and oriented x 3.    Lab Results:  Recent Labs  09/23/15 1311  WBC 4.8  HGB 15.9  PLT 219    Recent Labs  09/23/15 1311 09/24/15 0400  NA 142 140  K 4.3 3.6  CL 107 105  CO2 24 27  GLUCOSE 172* 107*  BUN <5* 9  CREATININE 0.91 0.73    Recent Labs  09/23/15 2234 09/24/15 0400  TROPONINI <0.03 <0.03   Hepatic Function Panel No results for input(s): PROT, ALBUMIN, AST, ALT, ALKPHOS, BILITOT, BILIDIR, IBILI in the last 72 hours.  Recent Labs  09/24/15 0400  CHOL 147   No results for input(s): PROTIME in the last 72 hours.  Imaging: Dg Chest 2 View  09/23/2015  CLINICAL DATA:  Left-sided chest pain. Diaphoresis. Symptoms began yesterday. EXAM: CHEST  2 VIEW COMPARISON:  05/22/2014 FINDINGS: Heart size is normal. Mediastinal shadows are normal. The lungs are hyperinflated and consistent with emphysema. No evidence of infiltrate, collapse or effusion. No acute bone finding. IMPRESSION: Emphysema.  No active disease otherwise. Electronically Signed   By: Nelson Chimes M.D.   On: 09/23/2015 13:19   Personally  viewed.   Telemetry: No adverse arrhythmias Personally viewed.   EKG:  Nonspecific ST-T wave changes, subtle depression in the inferior lateral leads Personally viewed.  Cardiac Studies:  Awaiting cardiac catheterization  Meds: Scheduled Meds: . aspirin EC  81 mg Oral Daily  . atorvastatin  80 mg Oral q1800  . losartan  25 mg Oral Daily  . metoprolol tartrate  12.5 mg Oral BID  . sodium chloride flush  3 mL Intravenous Q12H   Continuous Infusions: . sodium chloride 1 mL/kg/hr (09/24/15 0718)  . heparin 900 Units/hr (09/24/15 0531)   PRN Meds:.sodium chloride, acetaminophen, nitroGLYCERIN, ondansetron (ZOFRAN) IV, sodium chloride flush  Assessment/Plan:  Principal Problem:   Unstable angina (HCC) Active Problems:   Peripheral vascular disease ([place])   Carotid artery disease ([place])   History of right-sided carotid endarterectomy   Tobacco use  Unstable angina -Cardiac catheterization today, 1:30 -Continuing heparin until then -Aspirin, low-dose beta blocker, atorvastatin, losartan  Peripheral vascular disease -History of carotid endarterectomy right -Left sided bruit-checking Dopplers  Tobacco use -Encourage cessation  Jackson Mathews 09/24/2015, 11:23 AM

## 2015-09-24 NOTE — Discharge Summary (Signed)
Physician Discharge Summary       Patient ID: Jackson Mathews MRN: [id number] DOB/AGE: 1955/10/29 60 y.o.  Admit date: 09/23/2015 Discharge date: 09/24/2015 Primary Cardiologist:Dr. Marlou Porch   Discharge Diagnoses:  Principal Problem:   Unstable angina [facility]) Active Problems:   CAD in native artery-cath 09/24/15 non obstructive   Peripheral vascular disease ([place])   Carotid artery disease ([place])   History of right-sided carotid endarterectomy   Tobacco use   Precordial chest pain   Discharged Condition: good  Procedures: 09/24/15 cardiac cath with Dr. Angelena Form  Conclusion     Mid RCA lesion, 60% stenosed. This is a small non-dominant vessel.  Mid Cx lesion, 40% stenosed.  Ost LAD to Mid LAD lesion, 20% stenosed.  The left ventricular systolic function is normal.  1. Mild to moderate non-obstructive CAD. There is a moderately severe stenosis in the small non-dominant RCA.  2. Normal LV systolic function  Recommendations: Long acting nitrates. Smoking cessation.       Hospital Course: 60 year old Caucasian male with past medical history of coronary artery disease, s/p right CEA in 2012, emphysema, chronic tobacco abuse, hypertension and hyperlipidemia. He was seen by Dr. Johnsie Cancel in 2012 for evaluation of nausea, vomiting, and chest pain. He underwent ETT without significant abnormality. Despite calf pain at the time, he had normal ABIs. He has since had right CEA by Dr. Trula Slade in October 2012. Last carotid ultrasound on 05/22/2014 showed 1-39% ICA stenosis with severe left ECA stenosis. He has not seen Dr. Johnsie Cancel since his last visit in 2012/2013. Since his carotid surgery, he is occasional dizzy spell has significantly improved.  He was admitted 09/23/15 with he had significant epigastric and lower substernal pressure describing as an elephant sitting on the chest. The symptom was also associated with clamminess, significant diaphoresis, and shortness of breath. The worst  symptom lasted roughly 8-9 minutes before subsiding. The symptoms eventually went away after roughly 40 minutes.  Was seen by PCP and sent to [facility] for chest discomfort.  Initial EKG shows mild ST downsloping in inferolateral leads.  In ER he has a point tenderness on the left flank of the chest which has been persistent since yesterday, this is different from the chest pressure he felt the day before.   BP was elevated at [id number] systolic.    Placed on IV heparin BB statin, asa.  His troponins were negative for MI and plans were for cath.    Cardiac cath with nonobstructive disease see above, we increased his lipitor, ad BB, imdur and continued his ACE.  Tobacco cessation discussion was held with pt as well.  No cardiac rehab with no MI.    Post cath BP elevated once stable Dr. Marlou Porch who had evaluated felt he was stable to discharge home.     Consults: None  Significant Diagnostic Studies:  BMP Latest Ref Rng 09/24/2015 09/23/2015 05/22/2014  Glucose 65 - 99 mg/dL 107(H) 172(H) 123(H)  BUN 6 - 20 mg/dL 9 <5(L) 7  Creatinine 0.61 - 1.24 mg/dL 0.73 0.91 0.78  Sodium 135 - 145 mmol/L 140 142 142  Potassium 3.5 - 5.1 mmol/L 3.6 4.3 5.2  Chloride 101 - 111 mmol/L 105 107 101  CO2 22 - 32 mmol/L 27 24 29   Calcium 8.9 - 10.3 mg/dL 9.0 9.2 9.5   CBC Latest Ref Rng 09/23/2015 05/22/2014 05/06/2011  WBC 4.0 - 10.5 K/uL 4.8 4.8 6.3  Hemoglobin 13.0 - 17.0 g/dL 15.9 15.7 13.6  Hematocrit 39.0 - 52.0 % 46.8  47.3 41.3  Platelets 150 - 400 K/uL 219 251 203   Troponin <0.03 X3  Lipid Panel     Component Value Date/Time   CHOL 147 09/24/2015 0400   TRIG 103 09/24/2015 0400   HDL 33* 09/24/2015 0400   CHOLHDL 4.5 09/24/2015 0400   VLDL 21 09/24/2015 0400   LDLCALC 93 09/24/2015 0400   TSH  4.492  CHEST 2 VIEW COMPARISON: 05/22/2014 FINDINGS: Heart size is normal. Mediastinal shadows are normal. The lungs are hyperinflated and consistent with emphysema. No evidence of infiltrate, collapse or  effusion. No acute bone finding.  IMPRESSION: Emphysema. No active disease otherwise.  CAROTID Doppler s Pending done not read.     Discharge Exam: Blood pressure 160/94, pulse 64, temperature 97.7 F (36.5 C), temperature source Oral, resp. rate 20, height 5\' 9"  (1.753 m), weight 125 lb (56.7 kg), SpO2 100 %.  Disposition: 01-Home or Self Care     Medication List    TAKE these medications        aspirin 81 MG EC tablet  Take 1 tablet (81 mg total) by mouth daily.     atorvastatin 80 MG tablet  Commonly known as:  LIPITOR  Take 1 tablet (80 mg total) by mouth daily at 6 PM.     isosorbide mononitrate 30 MG 24 hr tablet  Commonly known as:  IMDUR  Take 1 tablet (30 mg total) by mouth daily.     losartan 25 MG tablet  Commonly known as:  COZAAR  Take 25 mg by mouth daily.     metoprolol tartrate 25 MG tablet  Commonly known as:  LOPRESSOR  Take 0.5 tablets (12.5 mg total) by mouth 2 (two) times daily.     nitroGLYCERIN 0.4 MG SL tablet  Commonly known as:  NITROSTAT  Place 1 tablet (0.4 mg total) under the tongue every 5 (five) minutes x 3 doses as needed for chest pain.       Follow-up Information    Follow up with Candee Furbish, MD.   Specialty:  Cardiology   Why:  the office will call with date and time    Contact information:   [address] [address] [phone]       Follow up with [facility].   Specialty:  Cardiology   Why:  Our office will call to schedule your echo of the heart    Contact information:   [address] [place] [phone]       Discharge Instructions: Call [facility] at [phone] if any bleeding, swelling or drainage at cath site.  May shower, no tub baths for 48 hours for groin sticks. No lifting over 5 pounds for 3 days.  No Driving for 3 days  Take 1 NTG, under your tongue, while sitting.  If no relief  of pain may repeat NTG, one tab every 5 minutes up to 3 tablets total over 15 minutes.  If no relief CALL 911.  If you have dizziness/lightheadness  while taking NTG, stop taking and call 911.        We increased your lipitor and added asprin and metoprolol and isosorbide.  Heart Healthy diet  Please try to stop smoking very important to prevent further CAD  Signed: [id number] R Nurse Practitioner-Certified [facility]: [facility] 09/24/2015, 5:36 PM  Time spent on discharge : > 30 minutes.    Personally seen  and examined. Agree with above. Reassuring cath, minor non obstructive CAD Carotid dz noted Ultrasound done in hospital, no progression of disease (left bruit) ECHO pending as outpatient.   Candee Furbish, MD

## 2015-09-24 NOTE — Discharge Instructions (Signed)
Call [facility] at [phone] if any bleeding, swelling or drainage at cath site.  May shower, no tub baths for 48 hours for groin sticks. No lifting over 5 pounds for 3 days.  No Driving for 3 days  Take 1 NTG, under your tongue, while sitting.  If no relief of pain may repeat NTG, one tab every 5 minutes up to 3 tablets total over 15 minutes.  If no relief CALL 911.  If you have dizziness/lightheadness  while taking NTG, stop taking and call 911.        We increase your lipitor and added asprin and metoprolol and isosorbide.  Heart Healthy diet  Please try to stop smoking very important to prevent further CAD

## 2015-09-24 NOTE — H&P (View-Only) (Signed)
   Cardiologist: Johnsie Cancel Subjective:  No further CP, no SOB. Awaiting cath  Objective:  Vital Signs in the last 24 hours: Temp:  [97.4 F (36.3 C)-98.3 F (36.8 C)] 97.4 F (36.3 C) (03/02 0555) Pulse Rate:  [64-96] 64 (03/02 0555) Resp:  [15-21] 17 (03/02 0555) BP: (114-171)/(64-88) 120/69 mmHg (03/02 0555) SpO2:  [97 %-99 %] 97 % (03/02 0555) Weight:  [125 lb (56.7 kg)-125 lb 10.6 oz (57 kg)] 125 lb (56.7 kg) (03/02 0418)  Intake/Output from previous day: 03/01 0701 - 03/02 0700 In: 120 [P.O.:120] Out: -    Physical Exam: General: Thin, in no acute distress. Head:  Normocephalic and atraumatic. Left carotid bruit Lungs: Clear to auscultation and percussion. Heart: Normal S1 and S2.  No murmur, rubs or gallops.  Abdomen: soft, non-tender, positive bowel sounds. Extremities: No clubbing or cyanosis. No edema. 2+ radial pulses Neurologic: Alert and oriented x 3.    Lab Results:  Recent Labs  09/23/15 1311  WBC 4.8  HGB 15.9  PLT 219    Recent Labs  09/23/15 1311 09/24/15 0400  NA 142 140  K 4.3 3.6  CL 107 105  CO2 24 27  GLUCOSE 172* 107*  BUN <5* 9  CREATININE 0.91 0.73    Recent Labs  09/23/15 2234 09/24/15 0400  TROPONINI <0.03 <0.03   Hepatic Function Panel No results for input(s): PROT, ALBUMIN, AST, ALT, ALKPHOS, BILITOT, BILIDIR, IBILI in the last 72 hours.  Recent Labs  09/24/15 0400  CHOL 147   No results for input(s): PROTIME in the last 72 hours.  Imaging: Dg Chest 2 View  09/23/2015  CLINICAL DATA:  Left-sided chest pain. Diaphoresis. Symptoms began yesterday. EXAM: CHEST  2 VIEW COMPARISON:  05/22/2014 FINDINGS: Heart size is normal. Mediastinal shadows are normal. The lungs are hyperinflated and consistent with emphysema. No evidence of infiltrate, collapse or effusion. No acute bone finding. IMPRESSION: Emphysema.  No active disease otherwise. Electronically Signed   By: Nelson Chimes M.D.   On: 09/23/2015 13:19   Personally  viewed.   Telemetry: No adverse arrhythmias Personally viewed.   EKG:  Nonspecific ST-T wave changes, subtle depression in the inferior lateral leads Personally viewed.  Cardiac Studies:  Awaiting cardiac catheterization  Meds: Scheduled Meds: . aspirin EC  81 mg Oral Daily  . atorvastatin  80 mg Oral q1800  . losartan  25 mg Oral Daily  . metoprolol tartrate  12.5 mg Oral BID  . sodium chloride flush  3 mL Intravenous Q12H   Continuous Infusions: . sodium chloride 1 mL/kg/hr (09/24/15 0718)  . heparin 900 Units/hr (09/24/15 0531)   PRN Meds:.sodium chloride, acetaminophen, nitroGLYCERIN, ondansetron (ZOFRAN) IV, sodium chloride flush  Assessment/Plan:  Principal Problem:   Unstable angina (HCC) Active Problems:   Peripheral vascular disease ([place])   Carotid artery disease ([place])   History of right-sided carotid endarterectomy   Tobacco use  Unstable angina -Cardiac catheterization today, 1:30 -Continuing heparin until then -Aspirin, low-dose beta blocker, atorvastatin, losartan  Peripheral vascular disease -History of carotid endarterectomy right -Left sided bruit-checking Dopplers  Tobacco use -Encourage cessation  SKAINS, MARK 09/24/2015, 11:23 AM

## 2015-09-24 NOTE — Progress Notes (Signed)
TR Band removed from Rt wirst. Level 0. Covered with a band aid. O2 Sats Rt thumb = 98%.

## 2015-09-24 NOTE — Interval H&P Note (Signed)
History and Physical Interval Note:  09/24/2015 1:08 PM  Jackson Mathews  has presented today for cardiac cath with the diagnosis of chest pain.  The various methods of treatment have been discussed with the patient and family. After consideration of risks, benefits and other options for treatment, the patient has consented to  Procedure(s): Left Heart Cath and Coronary Angiography (N/A) as a surgical intervention .  The patient's history has been reviewed, patient examined, no change in status, stable for surgery.  I have reviewed the patient's chart and labs.  Questions were answered to the patient's satisfaction.    Cath Lab Visit (complete for each Cath Lab visit)  Clinical Evaluation Leading to the Procedure:   ACS: No.  Non-ACS:    Anginal Classification: CCS III  Anti-ischemic medical therapy: Minimal Therapy (1 class of medications)  Non-Invasive Test Results: No non-invasive testing performed  Prior CABG: No previous CABG         [name]

## 2015-09-24 NOTE — Progress Notes (Signed)
ANTICOAGULATION CONSULT NOTE - Follow Up Consult  Pharmacy Consult for Heparin  Indication: chest pain/ACS, cath 3/2  Allergies  Allergen Reactions  . Codeine     Nausea    Patient Measurements: Height: 5\' 9"  (175.3 cm) Weight: 125 lb 10.6 oz (57 kg) IBW/kg (Calculated) : 70.7  Vital Signs: Temp: 98.3 F (36.8 C) (03/01 2207) Temp Source: Oral (03/01 2207) BP: 114/64 mmHg (03/01 2207) Pulse Rate: 75 (03/01 2207)  Labs:  Recent Labs  09/23/15 1311 09/23/15 2234 09/24/15 0400 09/24/15 0420  HGB 15.9  --   --   --   HCT 46.8  --   --   --   PLT 219  --   --   --   LABPROT  --   --   --  13.7  INR  --   --   --  1.03  HEPARINUNFRC  --   --   --  0.15*  CREATININE 0.91  --  0.73  --   TROPONINI  --  <0.03 <0.03  --     Estimated Creatinine Clearance: 81.1 mL/min (by C-G formula based on Cr of 0.73).  Assessment: Cath today, initial heparin level is low at 0.15  Goal of Therapy:  Heparin level 0.3-0.7 units/ml Monitor platelets by anticoagulation protocol: Yes   Plan:  -Increase heparin drip to 900 units/hr -F/U post-cath -Will need to order a HL if cath is delayed/postponed   Narda Bonds 09/24/2015,5:28 AM

## 2015-09-24 NOTE — Progress Notes (Signed)
O2 Sats Rt thumb = 99%.

## 2015-09-24 NOTE — Progress Notes (Signed)
VASCULAR LAB PRELIMINARY  PRELIMINARY  PRELIMINARY  PRELIMINARY  Carotid duplex  completed.    Preliminary report:  Right:  1-39% ICA stenosis.  Left:  40-59% internal carotid artery stenosis.  Right:  Vertebral artery flow is antegrade.  Left:  Vertebral artery "bunny sign" waveform.     Precious Segall, RVT 09/24/2015, 4:11 PM

## 2015-09-25 ENCOUNTER — Telehealth: Payer: Self-pay | Admitting: Cardiology

## 2015-09-25 ENCOUNTER — Encounter: Payer: Self-pay | Admitting: *Deleted

## 2015-09-25 DIAGNOSIS — Z006 Encounter for examination for normal comparison and control in clinical research program: Secondary | ICD-10-CM

## 2015-09-25 DIAGNOSIS — R0989 Other specified symptoms and signs involving the circulatory and respiratory systems: Secondary | ICD-10-CM | POA: Insufficient documentation

## 2015-09-25 LAB — HEMOGLOBIN A1C
HEMOGLOBIN A1C: 5.6 % (ref 4.8–5.6)
Mean Plasma Glucose: 114 mg/dL

## 2015-09-25 MED FILL — Heparin Sodium (Porcine) 2 Unit/ML in Sodium Chloride 0.9%: INTRAMUSCULAR | Qty: 500 | Status: AC

## 2015-09-25 NOTE — Telephone Encounter (Signed)
*  STAT* If patient is at the pharmacy, call can be transferred to refill team.  Pt [facility]. Pt wanted to know if he can pick up nitro and lopressor at original pharmacy [facility] [address] in [place]) before changing, since he needs them. Please call back and discuss.   1. Which medications need to be refilled? (please list name of each medication and dose if known) nitro 0.4, Lopressor 25mg , atorvastatin 80 mg, Losartan 25 mg, Imdur 30 mg   2. Which pharmacy/location (including street and city if local pharmacy) is medication to be sent to?[facility]\  3. Do they need a 30 day or 90 day supply? 90 day for [facility]

## 2015-09-25 NOTE — Telephone Encounter (Signed)
pt wanted all [facility], informed him that [facility] would have to call and request the active refills on file be moved to them from [facility], pt expressed understanding

## 2015-09-25 NOTE — Progress Notes (Signed)
  Subject Name: Jackson Shostak. Mathews  Subject met inclusion and exclusion criteria. The informed consent form, study requirements and expectations were reviewed with the subject and questions and concerns were addressed prior to the signing of the consent form. The subject verbalized understanding of the trail requirements. The subject agreed to participate in the CAD LAD trial and signed the informed consent. The informed consent was obtained prior to performance of any protocol-specific procedures for the subject. A copy of the signed informed consent was given to the subject and a copy was placed in the subject's medical record.  Jake Bathe, RN 09/24/2015 0830

## 2015-10-11 NOTE — Progress Notes (Signed)
Cardiology Office Note:    Date:  10/12/2015   ID:  Jackson Mathews, DOB 06-19-1956, MRN [id number]  PCP:  Gerda Diss, DO  Cardiologist:  Dr. Candee Furbish   Electrophysiologist:  n/a  Chief Complaint  Patient presents with  . Hospitalization Follow-up    Admx with Chest Pain s/p Cath    History of Present Illness:     Jackson Mathews is a 60 y.o. male with a hx of CAD, carotid stenosis status post R CEA in 2012, emphysema, tobacco abuse, HTN, HL. Previously evaluated by Dr. Johnsie Cancel in 2012.   Admitted 3/1-3/2 with epigastric and lower substernal chest pressure. Cardiac enzymes remained normal. Cardiac catheterization demonstrated mild to moderate nonobstructive CAD with moderately severe stenosis in the small nondominant RCA and normal LV function. Long-acting nitrates were recommended.   Returns for FU. Doing well since DC.  It took him a while to adjust to his medications. The patient denies chest pain, shortness of breath, syncope, orthopnea, PND or significant pedal edema.  He quit smoking yesterday.  Using a vape.    Past Medical History  Diagnosis Date  . Palpitations   . Lower back pain   . Hearing loss   . Dyspnea   . Calf pain   . Chest pain   . Tinnitus   . Vertigo   . Cancer (HCC)     Skin  . Carotid artery occlusion   . Hypertension   . Hypercholesteremia   . Hx of colonic polyp - ssp 06/21/2015  . History of right-sided carotid endarterectomy 09/23/2015  . Tobacco use 09/23/2015  . CAD in native artery-cath 09/24/15 non obstructive 09/24/2015    Past Surgical History  Procedure Laterality Date  . Skin cancer excision    . Melanoma excision      l eye  . Carotid endarterectomy  05/05/11    Right CEA  . Cardiac catheterization N/A 09/24/2015    Procedure: Left Heart Cath and Coronary Angiography;  Surgeon: Burnell Blanks, MD;  Location: [facility];  Service: Cardiovascular;  Laterality: N/A;    Current Medications: Outpatient  Prescriptions Prior to Visit  Medication Sig Dispense Refill  . aspirin EC 81 MG EC tablet Take 1 tablet (81 mg total) by mouth daily.    Marland Kitchen atorvastatin (LIPITOR) 80 MG tablet Take 1 tablet (80 mg total) by mouth daily at 6 PM. 30 tablet 6  . isosorbide mononitrate (IMDUR) 30 MG 24 hr tablet Take 1 tablet (30 mg total) by mouth daily. 30 tablet 6  . losartan (COZAAR) 25 MG tablet Take 25 mg by mouth daily.   3  . metoprolol tartrate (LOPRESSOR) 25 MG tablet Take 0.5 tablets (12.5 mg total) by mouth 2 (two) times daily. 30 tablet 6  . nitroGLYCERIN (NITROSTAT) 0.4 MG SL tablet Place 1 tablet (0.4 mg total) under the tongue every 5 (five) minutes x 3 doses as needed for chest pain. 25 tablet 4   No facility-administered medications prior to visit.     Allergies:   Codeine   Social History   Social History  . Marital Status: Single    Spouse Name: N/A  . Number of Children: N/A  . Years of Education: N/A   Social History Main Topics  . Smoking status: Current Every Day Smoker -- 1.00 packs/day for 30 years    Types: Cigarettes  . Smokeless tobacco: Never Used     Comment: 2 ppd for 35 years as of  2017  . Alcohol Use: No  . Drug Use: No  . Sexual Activity: Not Asked   Other Topics Concern  . None   Social History Narrative     Family History:  The patient's family history includes Cancer in his father; Diabetes in his brother; Heart attack in his father; Heart disease in his mother. There is no history of Colon cancer, Esophageal cancer, Ulcerative colitis, Stomach cancer, or Rectal cancer.   ROS:   Please see the history of present illness.    ROS All other systems reviewed and are negative.   Physical Exam:    VS:  BP 104/52 mmHg  Pulse 60  Ht 5\' 9"  (1.753 m)  Wt 130 lb 6.4 oz (59.149 kg)  BMI 19.25 kg/m2   GEN: Well nourished, well developed, in no acute distress HEENT: normal Neck: no JVD, no masses Cardiac: Normal S1/S2,  RRR; no murmurs, rubs, or gallops, no  edema; right wrist without hematoma or mass    Respiratory: decreased breath sounds bilaterally; no wheezing, rhonchi or rales GI: soft, nontender, nondistended MS: no deformity or atrophy Skin: warm and dry Neuro: No focal deficits  Psych: Alert and oriented x 3, normal affect  Wt Readings from Last 3 Encounters:  10/12/15 130 lb 6.4 oz (59.149 kg)  09/24/15 125 lb (56.7 kg)  06/15/15 125 lb (56.7 kg)      Studies/Labs Reviewed:     EKG:  EKG is  ordered today.  The ekg ordered today demonstrates sinus bradycardia, HR 54, normal axis, QTc 405 ms, no significant changes  Recent Labs: 09/23/2015: Hemoglobin 15.9; Platelets 219; TSH 4.492 09/24/2015: BUN 9; Creatinine, Ser 0.73; Potassium 3.6; Sodium 140   Recent Lipid Panel    Component Value Date/Time   CHOL 147 09/24/2015 0400   TRIG 103 09/24/2015 0400   HDL 33* 09/24/2015 0400   CHOLHDL 4.5 09/24/2015 0400   VLDL 21 09/24/2015 0400   LDLCALC 93 09/24/2015 0400    Additional studies/ records that were reviewed today include:   Echo 10/12/15 EF 55-60%, normal wall motion, trivial pericardial effusion  LHC 09/24/15 LAD ostial 20% RI small, patent LCx mid 40% RCA mid 60% EF >65% 1. Mild to moderate non-obstructive CAD. There is a moderately severe stenosis in the small non-dominant RCA.  2. Normal LV systolic function Recommendations: Long acting nitrates. Smoking cessation.   Carotid US 09/24/15 R 1-39%, L 40-59%   ASSESSMENT:     1. CAD in native artery-cath 09/24/15 non obstructive   2. Bilateral carotid artery disease ([place])   3. Tobacco use   4. Essential hypertension   5. HLD (hyperlipidemia)     PLAN:     In order of problems listed above:  1. CAD - No obstructive CAD on cath.  He did have some mod disease especially in a non-dominant RCA.  He is doing well with medical Rx.   Continue ASA, statin, beta-blocker, nitrates.  2. Carotid stenosis - He no longer follows with [facility].  Recent doppler with stable  plaque bilat.    -  Repeat Carotid US in 1 year.   3. Tobacco use - He is trying to quit.  4. HTN - Controlled.  5. HL - Continue statin. PCP to manage.     Medication Adjustments/Labs and Tests Ordered: Current medicines are reviewed at length with the patient today.  Concerns regarding medicines are outlined above.  Medication changes, Labs and Tests ordered today are outlined in the Patient Instructions  noted below. Patient Instructions  Medication Instructions:  Your physician recommends that you continue on your current medications as directed. Please refer to the Current Medication list given to you today.  Labwork: NONE  Testing/Procedures: Your physician has requested that you have a carotid duplex. THIS IS TO BE DONE IN 1 YEAR 09/2016. This test is an ultrasound of the carotid arteries in your neck. It looks at blood flow through these arteries that supply the brain with blood. Allow one hour for this exam. There are no restrictions or special instructions.  Follow-Up: Your physician wants you to follow-up in: 6 MONTHS WITH DR. Marlou Porch You will receive a reminder letter in the mail two months in advance. If you don't receive a letter, please call our office to schedule the follow-up appointment.  Any Other Special Instructions Will Be Listed Below (If Applicable).  If you need a refill on your cardiac medications before your next appointment, please call your pharmacy.   Signed, Richardson Dopp, PA-C  10/12/2015 5:36 PM    [facility], [place], [place]  [address] Phone: [phone]; Fax: [phone]

## 2015-10-12 ENCOUNTER — Encounter: Payer: Self-pay | Admitting: Physician Assistant

## 2015-10-12 ENCOUNTER — Ambulatory Visit (HOSPITAL_COMMUNITY): Payer: BLUE CROSS/BLUE SHIELD | Attending: Cardiology

## 2015-10-12 ENCOUNTER — Other Ambulatory Visit: Payer: Self-pay

## 2015-10-12 ENCOUNTER — Ambulatory Visit (INDEPENDENT_AMBULATORY_CARE_PROVIDER_SITE_OTHER): Payer: BLUE CROSS/BLUE SHIELD | Admitting: Physician Assistant

## 2015-10-12 VITALS — BP 104/52 | HR 60 | Ht 69.0 in | Wt 130.4 lb

## 2015-10-12 DIAGNOSIS — Z720 Tobacco use: Secondary | ICD-10-CM

## 2015-10-12 DIAGNOSIS — I10 Essential (primary) hypertension: Secondary | ICD-10-CM | POA: Diagnosis not present

## 2015-10-12 DIAGNOSIS — I779 Disorder of arteries and arterioles, unspecified: Secondary | ICD-10-CM | POA: Insufficient documentation

## 2015-10-12 DIAGNOSIS — I2510 Atherosclerotic heart disease of native coronary artery without angina pectoris: Secondary | ICD-10-CM | POA: Diagnosis not present

## 2015-10-12 DIAGNOSIS — R079 Chest pain, unspecified: Secondary | ICD-10-CM | POA: Insufficient documentation

## 2015-10-12 DIAGNOSIS — I739 Peripheral vascular disease, unspecified: Secondary | ICD-10-CM

## 2015-10-12 DIAGNOSIS — I071 Rheumatic tricuspid insufficiency: Secondary | ICD-10-CM | POA: Diagnosis not present

## 2015-10-12 DIAGNOSIS — I313 Pericardial effusion (noninflammatory): Secondary | ICD-10-CM | POA: Insufficient documentation

## 2015-10-12 DIAGNOSIS — R002 Palpitations: Secondary | ICD-10-CM | POA: Diagnosis not present

## 2015-10-12 DIAGNOSIS — E785 Hyperlipidemia, unspecified: Secondary | ICD-10-CM

## 2015-10-12 NOTE — Patient Instructions (Addendum)
Medication Instructions:  Your physician recommends that you continue on your current medications as directed. Please refer to the Current Medication list given to you today.  Labwork: NONE  Testing/Procedures: Your physician has requested that you have a carotid duplex. THIS IS TO BE DONE IN 1 YEAR 09/2016. This test is an ultrasound of the carotid arteries in your neck. It looks at blood flow through these arteries that supply the brain with blood. Allow one hour for this exam. There are no restrictions or special instructions.  Follow-Up: Your physician wants you to follow-up in: 6 MONTHS WITH DR. Marlou Porch You will receive a reminder letter in the mail two months in advance. If you don't receive a letter, please call our office to schedule the follow-up appointment.  Any Other Special Instructions Will Be Listed Below (If Applicable).  If you need a refill on your cardiac medications before your next appointment, please call your pharmacy.

## 2017-08-15 ENCOUNTER — Ambulatory Visit: Payer: BLUE CROSS/BLUE SHIELD | Admitting: Sports Medicine

## 2017-08-18 ENCOUNTER — Ambulatory Visit: Payer: 59 | Admitting: Family Medicine

## 2017-08-18 ENCOUNTER — Encounter: Payer: Self-pay | Admitting: Family Medicine

## 2017-08-18 VITALS — BP 118/70 | HR 74 | Temp 98.6°F | Ht 69.0 in | Wt 133.0 lb

## 2017-08-18 DIAGNOSIS — F17200 Nicotine dependence, unspecified, uncomplicated: Secondary | ICD-10-CM | POA: Diagnosis not present

## 2017-08-18 DIAGNOSIS — R399 Unspecified symptoms and signs involving the genitourinary system: Secondary | ICD-10-CM | POA: Diagnosis not present

## 2017-08-18 DIAGNOSIS — R0600 Dyspnea, unspecified: Secondary | ICD-10-CM

## 2017-08-18 DIAGNOSIS — I739 Peripheral vascular disease, unspecified: Secondary | ICD-10-CM | POA: Diagnosis not present

## 2017-08-18 DIAGNOSIS — R011 Cardiac murmur, unspecified: Secondary | ICD-10-CM | POA: Diagnosis not present

## 2017-08-18 DIAGNOSIS — R0609 Other forms of dyspnea: Secondary | ICD-10-CM

## 2017-08-18 DIAGNOSIS — L570 Actinic keratosis: Secondary | ICD-10-CM | POA: Diagnosis not present

## 2017-08-18 MED ORDER — TAMSULOSIN HCL 0.4 MG PO CAPS: 0.4000 mg | ORAL_CAPSULE | Freq: Every day | ORAL | 3 refills | Status: DC

## 2017-08-18 MED ORDER — SILDENAFIL CITRATE 20 MG PO TABS: 20.0000 mg | ORAL_TABLET | Freq: Every day | ORAL | 0 refills | Status: DC | PRN

## 2017-08-18 NOTE — Assessment & Plan Note (Signed)
Start Flomax.  Also sent in a prescription for sildenafil.  Return precautions reviewed.

## 2017-08-18 NOTE — Assessment & Plan Note (Signed)
Concern for aortic stenosis.  We will check an echocardiogram.

## 2017-08-18 NOTE — Progress Notes (Signed)
Subjective:  Jackson Mathews is a 62 y.o. male who presents today with a chief complaint of skin lesion.   HPI:  Skin Lesion, New Issue Located on top of right ear.  Has been there for several months.  Has tried picking it off however it keeps coming back.  No other treatments tried.  Has a history of skin cancer on his arms.  Lesion has been stable for the past several months.  Heart murmur, new issue Patient was first told about this several years ago.  Forgot about it until about a week ago when a nurse at work listen to his heart and asked him if anything has been done about it.  He has noticed increased dyspnea on exertion over the past week.  No chest pain.  No lower extremity swelling.  No orthopnea.  Lower urinary tract symptoms, new issue Several year history.  Patient notes that his symptoms are better when he takes sildenafil.  No dysuria.  No hematuria.  Nicotine dependence, new problem Several year history.  He is currently trying to stop smoking.  Would like to use patches to help him with this.  Carotid artery disease/peripheral vascular disease Patient previously on a regimen including aspirin and atorvastatin.  He is no longer taking either of these medications.  Does not want to take daily medications.  Patient is aware that this increases his overall risk for heart attack and stroke as well as early death.  ROS: Per HPI, otherwise a 10 point review of systems was performed and was negative  PMH:  The following were reviewed and entered/updated in epic: Past Medical History:  Diagnosis Date  . CAD in native artery-cath 09/24/15 non obstructive 09/24/2015  . Calf pain   . Cancer (HCC)    Skin  . Carotid artery occlusion   . Chest pain   . Dyspnea   . Hearing loss   . History of right-sided carotid endarterectomy 09/23/2015  . Hx of colonic polyp - ssp 06/21/2015  . Hypercholesteremia   . Hypertension   . Lower back pain   . Palpitations   . Tinnitus   .  Tobacco use 09/23/2015  . Vertigo    Patient Active Problem List   Diagnosis Date Noted  . Lower urinary tract symptoms (LUTS) 08/18/2017  . Systolic murmur 02/54/2706  . Actinic keratosis 08/18/2017  . Essential hypertension 10/12/2015  . HLD (hyperlipidemia) 10/12/2015  . CAD in native artery-cath 09/24/15 non obstructive 09/24/2015  . Peripheral vascular disease ([place]) 09/23/2015  . Carotid artery disease ([place]) 09/23/2015  . History of right-sided carotid endarterectomy 09/23/2015  . Nicotine dependence with current use 09/23/2015  . Hx of colonic polyp - ssp 06/21/2015  . HEARING LOSS 08/20/2010  . DYSPNEA 08/20/2010  . SKIN CANCER, HX OF 08/20/2010   Past Surgical History:  Procedure Laterality Date  . CARDIAC CATHETERIZATION N/A 09/24/2015   Procedure: Left Heart Cath and Coronary Angiography;  Surgeon: Burnell Blanks, MD;  Location: [facility];  Service: Cardiovascular;  Laterality: N/A;  . CAROTID ENDARTERECTOMY  05/05/11   Right CEA  . MELANOMA EXCISION     l eye  . SKIN CANCER EXCISION     Family History  Problem Relation Age of Onset  . Heart disease Mother   . Cancer Father   . Heart attack Father        s/p CABG  . Diabetes Brother   . Colon cancer Neg Hx   . Esophageal  cancer Neg Hx   . Ulcerative colitis Neg Hx   . Stomach cancer Neg Hx   . Rectal cancer Neg Hx    Medications- reviewed and updated Current Outpatient Medications  Medication Sig Dispense Refill  . sildenafil (REVATIO) 20 MG tablet Take 1-5 tablets (20-100 mg total) by mouth daily as needed. 30 tablet 0  . tamsulosin (FLOMAX) 0.4 MG CAPS capsule Take 1 capsule (0.4 mg total) by mouth daily. 30 capsule 3   No current facility-administered medications for this visit.    Allergies-reviewed and updated Allergies  Allergen Reactions  . Codeine     Nausea     Social History   Socioeconomic History  . Marital status: Single    Spouse name: None  . Number of children: None    . Years of education: None  . Highest education level: None  Social Needs  . Financial resource strain: None  . Food insecurity - worry: None  . Food insecurity - inability: None  . Transportation needs - medical: None  . Transportation needs - non-medical: None  Occupational History  . None  Tobacco Use  . Smoking status: Current Every Day Smoker    Packs/day: 1.00    Years: 30.00    Pack years: 30.00    Types: Cigarettes  . Smokeless tobacco: Never Used  . Tobacco comment: 2 ppd for 35 years as of 2017  Substance and Sexual Activity  . Alcohol use: No    Alcohol/week: 0.0 oz  . Drug use: No  . Sexual activity: None  Other Topics Concern  . None  Social History Narrative  . None   Objective:  Physical Exam: BP 118/70 (BP Location: Left Arm, Patient Position: Sitting, Cuff Size: Normal)   Pulse 74   Temp 98.6 F (37 C) (Oral)   Ht 5\' 9"  (1.753 m)   Wt 133 lb (60.3 kg)   SpO2 95%   BMI 19.64 kg/m   Gen: NAD, resting comfortably HEENT: Actinic keratoses noted scattered across auricles bilaterally. CV: RRR.  2 out of 6 systolic murmur noted. Pulm: NWOB, CTAB with no crackles, wheezes, or rhonchi GI: Normal bowel sounds present. Soft, Nontender, Nondistended. MSK: No edema, cyanosis, or clubbing noted Skin: 2 small, discrete actinic keratoses noted on right auricle.  2, small actinic keratoses noted on left auricle. Neuro: Grossly normal, moves all extremities Psych: Normal affect and thought content  Cryotherapy Procedure Note  Pre-operative Diagnosis: Actinic keratosis  Post-operative Diagnosis: Actinic keratosis  Locations: Bilateral auricles  Procedure Details  Patient informed of risks (permanent scarring, infection, light or dark discoloration, bleeding, infection, weakness, numbness and recurrence of the lesion) and benefits of the procedure and verbal informed consent obtained.  Four areas were treated today-2 on right auricle and to the left  auricle.  The areas are treated with liquid nitrogen therapy, frozen until ice ball extended to mm beyond lesion, allowed to thaw, and treated again. The patient tolerated procedure well.  The patient was instructed on post-op care, warned that there may be blister formation, redness and pain. Recommend OTC analgesia as needed for pain.  Assessment/Plan:  Actinic keratosis Cryotherapy applied today.  Please see above procedure note.  Advised continue sun protection and reasons to return to care.  Systolic murmur Concern for aortic stenosis.  We will check an echocardiogram.  Lower urinary tract symptoms (LUTS) Start Flomax.  Also sent in a prescription for sildenafil.  Return precautions reviewed.  Nicotine dependence with current use Patient was  asked about his tobacco use today and was strongly advised to quit. Patient is currently contemplative. We reviewed treatment options to assist him quit smoking including NRT, Chantix, and Bupropion.  He would like to start nicotine replacement therapy.  Declined starting Wellbutrin or Chantix today.  Follow-up at next office visit.  Total time spent counseling approximately 4 minutes.    Peripheral vascular disease ([place]) Not currently on any antiplatelet or cholesterol medication.  Patient does not want to start any of these medications.  He is aware that this may increase his overall risk for heart attack, stroke, and early death.  Preventative Healthcare Patient will return soon for CPE.  Algis Greenhouse. Jerline Pain, MD 08/18/2017 5:02 PM

## 2017-08-18 NOTE — Assessment & Plan Note (Signed)
Not currently on any antiplatelet or cholesterol medication.  Patient does not want to start any of these medications.  He is aware that this may increase his overall risk for heart attack, stroke, and early death.

## 2017-08-18 NOTE — Patient Instructions (Signed)
Please start the flomax to see if it helps with your urination.  I would like to get an ultrasound of your heart to make sure that you do not have a leaky heart valve.  I will also gave prescription for sildenafil.  I would like to see you once yearly for regular checkup, or more often as needed.  Please make sure you avoid direct sunlight exposure when you are outdoors.  Take care, Dr. Jerline Pain

## 2017-08-18 NOTE — Assessment & Plan Note (Signed)
Patient was asked about his tobacco use today and was strongly advised to quit. Patient is currently contemplative. We reviewed treatment options to assist him quit smoking including NRT, Chantix, and Bupropion.  He would like to start nicotine replacement therapy.  Declined starting Wellbutrin or Chantix today.  Follow-up at next office visit.  Total time spent counseling approximately 4 minutes.

## 2017-08-18 NOTE — Assessment & Plan Note (Signed)
Cryotherapy applied today.  Please see above procedure note.  Advised continue sun protection and reasons to return to care.

## 2017-08-23 ENCOUNTER — Other Ambulatory Visit: Payer: Self-pay

## 2017-08-23 MED ORDER — SILDENAFIL CITRATE 25 MG PO TABS: 25.0000 mg | ORAL_TABLET | Freq: Every day | ORAL | 0 refills | Status: DC | PRN

## 2017-08-25 ENCOUNTER — Other Ambulatory Visit: Payer: Self-pay

## 2017-08-25 ENCOUNTER — Ambulatory Visit (HOSPITAL_COMMUNITY): Payer: 59 | Attending: Cardiology

## 2017-08-25 DIAGNOSIS — R011 Cardiac murmur, unspecified: Secondary | ICD-10-CM | POA: Diagnosis not present

## 2017-08-25 DIAGNOSIS — I358 Other nonrheumatic aortic valve disorders: Secondary | ICD-10-CM | POA: Diagnosis not present

## 2017-08-25 DIAGNOSIS — I10 Essential (primary) hypertension: Secondary | ICD-10-CM | POA: Insufficient documentation

## 2017-08-25 DIAGNOSIS — R0609 Other forms of dyspnea: Secondary | ICD-10-CM | POA: Diagnosis not present

## 2017-08-25 DIAGNOSIS — Z720 Tobacco use: Secondary | ICD-10-CM | POA: Insufficient documentation

## 2017-08-25 DIAGNOSIS — E785 Hyperlipidemia, unspecified: Secondary | ICD-10-CM | POA: Diagnosis not present

## 2017-08-25 DIAGNOSIS — R0600 Dyspnea, unspecified: Secondary | ICD-10-CM

## 2017-08-25 DIAGNOSIS — I2510 Atherosclerotic heart disease of native coronary artery without angina pectoris: Secondary | ICD-10-CM | POA: Insufficient documentation

## 2017-09-17 ENCOUNTER — Other Ambulatory Visit: Payer: Self-pay | Admitting: Family Medicine

## 2018-03-27 ENCOUNTER — Other Ambulatory Visit: Payer: Self-pay | Admitting: Family Medicine

## 2018-07-10 DIAGNOSIS — H0014 Chalazion left upper eyelid: Secondary | ICD-10-CM | POA: Diagnosis not present

## 2018-10-05 DIAGNOSIS — H2513 Age-related nuclear cataract, bilateral: Secondary | ICD-10-CM | POA: Diagnosis not present

## 2018-10-05 DIAGNOSIS — H0100B Unspecified blepharitis left eye, upper and lower eyelids: Secondary | ICD-10-CM | POA: Diagnosis not present

## 2018-10-05 DIAGNOSIS — H0100A Unspecified blepharitis right eye, upper and lower eyelids: Secondary | ICD-10-CM | POA: Diagnosis not present

## 2018-10-12 ENCOUNTER — Other Ambulatory Visit: Payer: Self-pay

## 2018-10-12 ENCOUNTER — Encounter (HOSPITAL_COMMUNITY): Payer: Self-pay | Admitting: Student

## 2018-10-12 ENCOUNTER — Observation Stay (HOSPITAL_COMMUNITY)
Admission: EM | Admit: 2018-10-12 | Discharge: 2018-10-14 | Disposition: A | Discharge status: Home / Self Care | Payer: 59 | Attending: Internal Medicine | Admitting: Internal Medicine

## 2018-10-12 ENCOUNTER — Emergency Department (HOSPITAL_COMMUNITY): Payer: 59

## 2018-10-12 DIAGNOSIS — J40 Bronchitis, not specified as acute or chronic: Secondary | ICD-10-CM | POA: Diagnosis present

## 2018-10-12 DIAGNOSIS — J9601 Acute respiratory failure with hypoxia: Secondary | ICD-10-CM | POA: Insufficient documentation

## 2018-10-12 DIAGNOSIS — R0902 Hypoxemia: Secondary | ICD-10-CM | POA: Insufficient documentation

## 2018-10-12 DIAGNOSIS — I2510 Atherosclerotic heart disease of native coronary artery without angina pectoris: Secondary | ICD-10-CM | POA: Diagnosis present

## 2018-10-12 DIAGNOSIS — J209 Acute bronchitis, unspecified: Principal | ICD-10-CM | POA: Insufficient documentation

## 2018-10-12 DIAGNOSIS — Z720 Tobacco use: Secondary | ICD-10-CM | POA: Diagnosis present

## 2018-10-12 DIAGNOSIS — Z8249 Family history of ischemic heart disease and other diseases of the circulatory system: Secondary | ICD-10-CM | POA: Insufficient documentation

## 2018-10-12 DIAGNOSIS — E785 Hyperlipidemia, unspecified: Secondary | ICD-10-CM | POA: Diagnosis not present

## 2018-10-12 DIAGNOSIS — Z79899 Other long term (current) drug therapy: Secondary | ICD-10-CM | POA: Insufficient documentation

## 2018-10-12 DIAGNOSIS — R0602 Shortness of breath: Secondary | ICD-10-CM | POA: Diagnosis present

## 2018-10-12 DIAGNOSIS — Z885 Allergy status to narcotic agent status: Secondary | ICD-10-CM | POA: Diagnosis not present

## 2018-10-12 DIAGNOSIS — J42 Unspecified chronic bronchitis: Secondary | ICD-10-CM | POA: Insufficient documentation

## 2018-10-12 DIAGNOSIS — F17210 Nicotine dependence, cigarettes, uncomplicated: Secondary | ICD-10-CM | POA: Insufficient documentation

## 2018-10-12 DIAGNOSIS — D72829 Elevated white blood cell count, unspecified: Secondary | ICD-10-CM | POA: Diagnosis present

## 2018-10-12 DIAGNOSIS — I10 Essential (primary) hypertension: Secondary | ICD-10-CM | POA: Diagnosis present

## 2018-10-12 DIAGNOSIS — I739 Peripheral vascular disease, unspecified: Secondary | ICD-10-CM | POA: Diagnosis not present

## 2018-10-12 DIAGNOSIS — E871 Hypo-osmolality and hyponatremia: Secondary | ICD-10-CM | POA: Diagnosis not present

## 2018-10-12 HISTORY — DX: Hypoxemia: R09.02

## 2018-10-12 LAB — INFLUENZA PANEL BY PCR (TYPE A & B)
Influenza A By PCR: NEGATIVE
Influenza B By PCR: NEGATIVE

## 2018-10-12 LAB — CBC WITH DIFFERENTIAL/PLATELET
Abs Immature Granulocytes: 0.07 10*3/uL (ref 0.00–0.07)
Basophils Absolute: 0 10*3/uL (ref 0.0–0.1)
Basophils Relative: 0 %
EOS ABS: 0 10*3/uL (ref 0.0–0.5)
EOS PCT: 0 %
HEMATOCRIT: 45 % (ref 39.0–52.0)
Hemoglobin: 15.2 g/dL (ref 13.0–17.0)
IMMATURE GRANULOCYTES: 1 %
LYMPHS ABS: 0.5 10*3/uL — AB (ref 0.7–4.0)
LYMPHS PCT: 4 %
MCH: 29.6 pg (ref 26.0–34.0)
MCHC: 33.8 g/dL (ref 30.0–36.0)
MCV: 87.7 fL (ref 80.0–100.0)
Monocytes Absolute: 1.3 10*3/uL — ABNORMAL HIGH (ref 0.1–1.0)
Monocytes Relative: 10 %
NEUTROS PCT: 85 %
NRBC: 0 % (ref 0.0–0.2)
Neutro Abs: 12 10*3/uL — ABNORMAL HIGH (ref 1.7–7.7)
Platelets: 242 10*3/uL (ref 150–400)
RBC: 5.13 MIL/uL (ref 4.22–5.81)
RDW: 12.5 % (ref 11.5–15.5)
WBC: 13.9 10*3/uL — AB (ref 4.0–10.5)

## 2018-10-12 LAB — BASIC METABOLIC PANEL
ANION GAP: 12 (ref 5–15)
BUN: 16 mg/dL (ref 8–23)
CALCIUM: 8.9 mg/dL (ref 8.9–10.3)
CO2: 23 mmol/L (ref 22–32)
CREATININE: 0.77 mg/dL (ref 0.61–1.24)
Chloride: 96 mmol/L — ABNORMAL LOW (ref 98–111)
Glucose, Bld: 137 mg/dL — ABNORMAL HIGH (ref 70–99)
Potassium: 4 mmol/L (ref 3.5–5.1)
Sodium: 131 mmol/L — ABNORMAL LOW (ref 135–145)

## 2018-10-12 LAB — RESPIRATORY PANEL BY PCR
ADENOVIRUS-RVPPCR: NOT DETECTED
Bordetella pertussis: NOT DETECTED
Chlamydophila pneumoniae: NOT DETECTED
Coronavirus 229E: NOT DETECTED
Coronavirus HKU1: NOT DETECTED
Coronavirus NL63: NOT DETECTED
Coronavirus OC43: NOT DETECTED
Influenza A: NOT DETECTED
Influenza B: NOT DETECTED
Metapneumovirus: NOT DETECTED
Mycoplasma pneumoniae: NOT DETECTED
Parainfluenza Virus 1: NOT DETECTED
Parainfluenza Virus 2: NOT DETECTED
Parainfluenza Virus 3: NOT DETECTED
Parainfluenza Virus 4: NOT DETECTED
Respiratory Syncytial Virus: NOT DETECTED
Rhinovirus / Enterovirus: NOT DETECTED

## 2018-10-12 LAB — I-STAT TROPONIN, ED: Troponin i, poc: 0 ng/mL (ref 0.00–0.08)

## 2018-10-12 MED ORDER — ATORVASTATIN CALCIUM 40 MG PO TABS
40.0000 mg | ORAL_TABLET | Freq: Every day | ORAL | Status: DC
  Administered 2018-10-13: 40 mg via ORAL
  Filled 2018-10-12: qty 1

## 2018-10-12 MED ORDER — NICOTINE 21 MG/24HR TD PT24
21.0000 mg | MEDICATED_PATCH | Freq: Every day | TRANSDERMAL | Status: DC
  Administered 2018-10-12 – 2018-10-14 (×3): 21 mg via TRANSDERMAL
  Filled 2018-10-12 (×3): qty 1

## 2018-10-12 MED ORDER — IPRATROPIUM BROMIDE HFA 17 MCG/ACT IN AERS: 2.0000 | INHALATION_SPRAY | Freq: Once | RESPIRATORY_TRACT | Status: DC

## 2018-10-12 MED ORDER — ASPIRIN EC 81 MG PO TBEC
81.0000 mg | DELAYED_RELEASE_TABLET | Freq: Every day | ORAL | Status: DC
  Administered 2018-10-12 – 2018-10-14 (×3): 81 mg via ORAL
  Filled 2018-10-12 (×3): qty 1

## 2018-10-12 MED ORDER — AZITHROMYCIN 250 MG PO TABS
500.0000 mg | ORAL_TABLET | Freq: Every day | ORAL | Status: AC
  Administered 2018-10-12: 500 mg via ORAL
  Filled 2018-10-12: qty 2

## 2018-10-12 MED ORDER — SODIUM CHLORIDE 0.9 % IV BOLUS
500.0000 mL | Freq: Once | INTRAVENOUS | Status: AC
  Administered 2018-10-12: 500 mL via INTRAVENOUS

## 2018-10-12 MED ORDER — ALBUTEROL SULFATE (2.5 MG/3ML) 0.083% IN NEBU: 2.5000 mg | INHALATION_SOLUTION | RESPIRATORY_TRACT | Status: DC | PRN

## 2018-10-12 MED ORDER — IPRATROPIUM BROMIDE 0.02 % IN SOLN: 0.5000 mg | Freq: Once | RESPIRATORY_TRACT | Status: DC

## 2018-10-12 MED ORDER — ACETAMINOPHEN 650 MG RE SUPP: 650.0000 mg | Freq: Four times a day (QID) | RECTAL | Status: DC | PRN

## 2018-10-12 MED ORDER — GUAIFENESIN ER 600 MG PO TB12
1200.0000 mg | ORAL_TABLET | Freq: Two times a day (BID) | ORAL | Status: DC
  Administered 2018-10-12 – 2018-10-14 (×3): 1200 mg via ORAL
  Filled 2018-10-12 (×4): qty 2

## 2018-10-12 MED ORDER — ACETAMINOPHEN 325 MG PO TABS
650.0000 mg | ORAL_TABLET | Freq: Four times a day (QID) | ORAL | Status: DC | PRN
  Administered 2018-10-12 – 2018-10-13 (×4): 650 mg via ORAL
  Filled 2018-10-12 (×4): qty 2

## 2018-10-12 MED ORDER — IPRATROPIUM-ALBUTEROL 0.5-2.5 (3) MG/3ML IN SOLN
3.0000 mL | Freq: Once | RESPIRATORY_TRACT | Status: DC
  Filled 2018-10-12: qty 3

## 2018-10-12 MED ORDER — ENOXAPARIN SODIUM 40 MG/0.4ML ~~LOC~~ SOLN
40.0000 mg | SUBCUTANEOUS | Status: DC
  Administered 2018-10-12 – 2018-10-13 (×2): 40 mg via SUBCUTANEOUS
  Filled 2018-10-12 (×2): qty 0.4

## 2018-10-12 MED ORDER — ALBUTEROL SULFATE HFA 108 (90 BASE) MCG/ACT IN AERS
2.0000 | INHALATION_SPRAY | Freq: Once | RESPIRATORY_TRACT | Status: AC
  Administered 2018-10-12: 2 via RESPIRATORY_TRACT
  Filled 2018-10-12: qty 6.7

## 2018-10-12 MED ORDER — AZITHROMYCIN 250 MG PO TABS
250.0000 mg | ORAL_TABLET | Freq: Every day | ORAL | Status: DC
  Administered 2018-10-13: 250 mg via ORAL
  Filled 2018-10-12: qty 1

## 2018-10-12 NOTE — ED Notes (Signed)
Patient transported to X-ray 

## 2018-10-12 NOTE — ED Triage Notes (Signed)
Pt here for evaluation of shortness of breath at rest x 1 week. Endorses productive cough with brown sputum, headache and "chest cavity pain from the cough."

## 2018-10-12 NOTE — H&P (Addendum)
History and Physical    Jackson Mathews OMV:[id number] DOB: [birth year] DOA: 10/12/2018  PCP: Vivi Barrack, MD  Patient coming from: Home  I have personally briefly reviewed patient's old medical records in [place]  Chief Complaint: Shortness of breath, cough  HPI: Jackson Mathews is a 63 y.o. male with medical history significant for CAD (mild to moderate nonobstructive by [facility] 2017), hypertension, hyperlipidemia, PVD, CAS s/p Rt CEA 2012, and chronic tobacco use (2 PPD for 45 years) who presents the Jackson Mathews with about 1 week of new onset shortness of breath and cough productive of occasional brown sputum.  He has had associated musculoskeletal chest wall pain exacerbated by coughing.  He reports low-grade temperatures at home, highest of 100.4 Fahrenheit.  He denies any diaphoresis, chills, palpitations, typical chest pain, nausea, vomiting, abdominal pain, diarrhea, myalgias, or peripheral edema.  He denies any hemoptysis.    Patient is a chronic smoker with 90 pack years and at his baseline he does not have any dyspnea on exertion or cough.  He says he is not currently taking any medications other than recently using Delsym for his cough with some relief.  He denies any recent travel out of the city or in an airport.  He denies any known sick contacts.  Jackson Mathews Course:  In the Jackson Mathews initial vitals showed BP 142/81, pulse 105, RR 23, temp 98.5 Fahrenheit, SPO2 93% on room air.  Labs are notable for WBC 13.9, hemoglobin 15.2, platelets 242, sodium 131, potassium 4.0, serum glucose 137, BUN 16, creatinine 0.77, stat troponin 0.00.  Influenza panel was negative.  RSV panel is obtained and pending.  2 view chest x-ray showed hyperexpanded lung fields consolidation, effusion, or pulmonary edema.  Patient was given 1 L of normal saline and an albuterol inhaler treatment with symptomatic improvement.  On repeat ambulation he had oxygen desaturation to 87% on room air.  The hospitalist service  was consulted to admit for further evaluation and management of hypoxia.  Review of Systems: As per HPI otherwise 10 point review of systems negative.    Past Medical History:  Diagnosis Date  . CAD in native artery-cath 09/24/15 non obstructive 09/24/2015  . Calf pain   . Cancer (HCC)    Skin  . Carotid artery occlusion   . Chest pain   . Dyspnea   . Hearing loss   . History of right-sided carotid endarterectomy 09/23/2015  . Hx of colonic polyp - ssp 06/21/2015  . Hypercholesteremia   . Hypertension   . Lower back pain   . Palpitations   . Tinnitus   . Tobacco use 09/23/2015  . Vertigo     Past Surgical History:  Procedure Laterality Date  . CARDIAC CATHETERIZATION N/A 09/24/2015   Procedure: Left Heart Cath and Coronary Angiography;  Surgeon: Burnell Blanks, MD;  Location: [place] [facility];  Service: Cardiovascular;  Laterality: N/A;  . CAROTID ENDARTERECTOMY  05/05/11   Right CEA  . MELANOMA EXCISION     l eye  . SKIN CANCER EXCISION       reports that he has been smoking cigarettes. He has a 30.00 pack-year smoking history. He has never used smokeless tobacco. He reports that he does not drink alcohol or use drugs.  Allergies  Allergen Reactions  . Codeine Nausea Only    Nausea     Family History  Problem Relation Age of Onset  . Heart disease Mother   . Cancer Father   .  Heart attack Father        s/p CABG  . Diabetes Brother   . Colon cancer Neg Hx   . Esophageal cancer Neg Hx   . Ulcerative colitis Neg Hx   . Stomach cancer Neg Hx   . Rectal cancer Neg Hx      Prior to Admission medications   Medication Sig Start Date End Date Taking? Authorizing Provider  sildenafil (REVATIO) 20 MG tablet TAKE 1 TO 5 TABLETS BY MOUTH DAILY AS NEEDED Patient not taking: Reported on 10/12/2018 09/18/17   Vivi Barrack, MD  sildenafil (VIAGRA) 25 MG tablet TAKE 1 TO 4 TABLETS BY MOUTH DAILY AS NEEDED FOR ERECTILE DYSFUNCTION Patient not taking: Reported on  10/12/2018 03/27/18   Vivi Barrack, MD  tamsulosin (FLOMAX) 0.4 MG CAPS capsule Take 1 capsule (0.4 mg total) by mouth daily. Patient not taking: Reported on 10/12/2018 08/18/17   Vivi Barrack, MD    Physical Exam: Vitals:   10/12/18 1700 10/12/18 1715 10/12/18 1730 10/12/18 1745  BP: (!) 141/76 (!) 155/77 139/80 (!) 150/88  Pulse: 93 (!) 106 94 (!) 107  Resp: (!) 25 (!) 21 (!) 22 (!) 31  Temp:      TempSrc:      SpO2: 93% 93% 94% 91%  Weight:      Height:        Constitutional: Resting in bed, NAD, calm, comfortable, frequent nonproductive coughing Eyes: PERRL, lids and conjunctivae normal ENMT: Mucous membranes are dry. Posterior pharynx clear of any exudate or lesions.Normal dentition.  Neck: normal, supple, no masses. Respiratory: Distant breath sounds, clear to auscultation bilaterally, no wheezing, no crackles. Normal respiratory effort. No accessory muscle use.  Cardiovascular: Regular rate and rhythm, 2/6 systolic murmur. No extremity edema. Abdomen: no tenderness, no masses palpated. No hepatosplenomegaly. Bowel sounds positive.  Musculoskeletal: no clubbing / cyanosis. No joint deformity upper and lower extremities. Good ROM, no contractures. Normal muscle tone.  Skin: no rashes, lesions, ulcers. No induration Neurologic: CN 2-12 grossly intact. Sensation intact,  Strength 5/5 in all 4.  Psychiatric: Normal judgment and insight. Alert and oriented x 3. Normal mood.     Labs on Admission: I have personally reviewed following labs and imaging studies  CBC: Recent Labs  Lab 10/12/18 1543  WBC 13.9*  NEUTROABS 12.0*  HGB 15.2  HCT 45.0  MCV 87.7  PLT 852   Basic Metabolic Panel: Recent Labs  Lab 10/12/18 1543  NA 131*  K 4.0  CL 96*  CO2 23  GLUCOSE 137*  BUN 16  CREATININE 0.77  CALCIUM 8.9   GFR: Estimated Creatinine Clearance: 87.1 mL/min (by C-G formula based on SCr of 0.77 mg/dL). Liver Function Tests: No results for input(s): AST, ALT,  ALKPHOS, BILITOT, PROT, ALBUMIN in the last 168 hours. No results for input(s): LIPASE, AMYLASE in the last 168 hours. No results for input(s): AMMONIA in the last 168 hours. Coagulation Profile: No results for input(s): INR, PROTIME in the last 168 hours. Cardiac Enzymes: No results for input(s): CKTOTAL, CKMB, CKMBINDEX, TROPONINI in the last 168 hours. BNP (last 3 results) No results for input(s): PROBNP in the last 8760 hours. HbA1C: No results for input(s): HGBA1C in the last 72 hours. CBG: No results for input(s): GLUCAP in the last 168 hours. Lipid Profile: No results for input(s): CHOL, HDL, LDLCALC, TRIG, CHOLHDL, LDLDIRECT in the last 72 hours. Thyroid Function Tests: No results for input(s): TSH, T4TOTAL, FREET4, T3FREE, THYROIDAB in the last  72 hours. Anemia Panel: No results for input(s): VITAMINB12, FOLATE, FERRITIN, TIBC, IRON, RETICCTPCT in the last 72 hours. Urine analysis:    Component Value Date/Time   COLORURINE YELLOW 04/29/2011 [place] 04/29/2011 1112   LABSPEC 1.004 (L) 04/29/2011 1112   PHURINE 7.0 04/29/2011 1112   GLUCOSEU NEGATIVE 04/29/2011 1112   HGBUR NEGATIVE 04/29/2011 1112   BILIRUBINUR NEGATIVE 04/29/2011 1112   [place] 04/29/2011 1112   PROTEINUR NEGATIVE 04/29/2011 1112   UROBILINOGEN 0.2 04/29/2011 1112   NITRITE NEGATIVE 04/29/2011 1112   LEUKOCYTESUR SMALL (A) 04/29/2011 1112    Radiological Exams on Admission: Dg Chest 2 View  Result Date: 10/12/2018 CLINICAL DATA:  Cough and shortness of breath EXAM: CHEST - 2 VIEW COMPARISON:  09/23/2015 FINDINGS: Cardiac shadow is within normal limits. The lungs are hyperinflated. No focal infiltrate or sizable effusion is seen. No acute bony abnormality is noted. IMPRESSION: No acute abnormality noted. Electronically Signed   By: Inez Catalina M.D.   On: 10/12/2018 16:31    EKG: Independently reviewed. Sinus tachycardia, biatrial enlargement, slight motion artifact.   Assessment/Plan Principal Problem:   Hypoxia Active Problems:   CAD in native artery-cath 09/24/15 non obstructive   Essential hypertension   HLD (hyperlipidemia)   Tobacco use   Bronchitis due to tobacco use  Jackson Mathews is a 63 y.o. male with medical history significant for CAD (mild to moderate nonobstructive by [facility] 2017), hypertension, hyperlipidemia, PVD, CAS s/p Rt CEA 2012, and chronic tobacco use (2 PPD for 45 years) who is admitted with hypoxia on ambulation likely secondary to bronchitis in the setting of chronic tobacco use and likely underlying COPD.  Hypoxia, bronchitis due to tobacco use: Patient with shortness of breath at rest, cough productive of scant brown sputum, leukocytosis, and low-grade temperature at home with 90 pack years smoking history and likely underlying COPD.  He has been afebrile while in the hospital.  Chest x-ray shows hyperexpanded lung fields without infiltrate or consolidation.  He denies sick contacts or significant travel history.  Overall, clinical picture is consistent with likely tobacco use associated bronchitis and potentially mild COPD exacerbation.  Influenza panel was negative, RVP pending. -Continue albuterol inhaler as needed wheezing and shortness of breath -Start oral azithromycin, scheduled guaifenesin -Supplemental O2 via [place] as needed -Check pulse ox with ambulation in the a.m.  CAD: Mild-moderate nonobstructive CAD and moderately severe stenosis in the small nondominant RCA seen on [facility] 09/2015.  He has not been on antiplatelet or statin therapy due to personal preference but is agreeable to restart. -Start aspirin 81 mg daily -Start atorvastatin 40 mg daily  Hypertension: BP mildly elevated on admission, he is not on antihypertensives as an outpatient.  We will continue monitor and defer to PCP for further management unless becomes significantly hypertensive while admitted.  Hyperlipidemia: As above, patient agreeable to  restarting statin therapy. -Start atorvastatin 40 mg daily  Tobacco use: Has been smoking 2 PPD for 45 years.  He is considering quitting. -Smoking cessation counseling provided -Nicotine patch ordered  DVT prophylaxis: Lovenox Code Status: Full code, confirmed with patient Family Communication: None present at bedside on admission Disposition Plan: Likely discharge to home pending improvement in oxygenation status with ambulation Consults called: None Admission status: Observation   Zada Finders MD [facility] Pager [phone]  If 7PM-7AM, please contact night-coverage [email or web address]  10/12/2018, 7:50 PM

## 2018-10-12 NOTE — ED Provider Notes (Addendum)
[place] EMERGENCY DEPARTMENT Provider Note   CSN: [id number] Arrival date & time: 10/12/18  1432    History   Chief Complaint Chief Complaint  Patient presents with  . Shortness of Breath    HPI TERRIN MEDDAUGH is a 63 y.o. male with a hx of tobacco abuse, CAD, hypercholesterolemia, HTN, PVD, and R carotid artery occlusion s/p endarterectomy who presents to the ED with complaints of dyspnea x 4 days. Patient notes he has some mild baseline dry cough & dyspnea at baseline with his hx of tobacco use, however over the past 4 days these sxs have seem to significantly worsen. He notes dyspnea is constant, worse with exertion, long winded conversation, and wakes him at night sometimes. His cough is intermittently productive with brown mucous sputum. States at times he feels he is wheezing. He has had associated low grade fever w/ temps ranging 99.5-100.4 as well as some generalized aches worse with coughing. He has variable answers in regard to chest pain, states not really, but then states yes from cough. Denies ear pain, sore throat, congestion, emesis, or abdominal pain. Denies leg pain/swelling, hemoptysis, recent surgery/trauma, recent long travel, hormone use,  or hx of DVT/PE. Notes hx of skin cancer s/p resection, no other cancer hx of tx for such in the past 6 months. Patient denies recent travel or known exposure to any lab confirmed covid 19 case.      HPI  Past Medical History:  Diagnosis Date  . CAD in native artery-cath 09/24/15 non obstructive 09/24/2015  . Calf pain   . Cancer (HCC)    Skin  . Carotid artery occlusion   . Chest pain   . Dyspnea   . Hearing loss   . History of right-sided carotid endarterectomy 09/23/2015  . Hx of colonic polyp - ssp 06/21/2015  . Hypercholesteremia   . Hypertension   . Lower back pain   . Palpitations   . Tinnitus   . Tobacco use 09/23/2015  . Vertigo     Patient Active Problem List   Diagnosis Date Noted  .  Lower urinary tract symptoms (LUTS) 08/18/2017  . Systolic murmur 03/55/9741  . Actinic keratosis 08/18/2017  . Essential hypertension 10/12/2015  . HLD (hyperlipidemia) 10/12/2015  . CAD in native artery-cath 09/24/15 non obstructive 09/24/2015  . Peripheral vascular disease ([place]) 09/23/2015  . Carotid artery disease ([place]) 09/23/2015  . History of right-sided carotid endarterectomy 09/23/2015  . Nicotine dependence with current use 09/23/2015  . Hx of colonic polyp - ssp 06/21/2015  . HEARING LOSS 08/20/2010  . DYSPNEA 08/20/2010  . SKIN CANCER, HX OF 08/20/2010    Past Surgical History:  Procedure Laterality Date  . CARDIAC CATHETERIZATION N/A 09/24/2015   Procedure: Left Heart Cath and Coronary Angiography;  Surgeon: Burnell Blanks, MD;  Location: [place] [facility];  Service: Cardiovascular;  Laterality: N/A;  . CAROTID ENDARTERECTOMY  05/05/11   Right CEA  . MELANOMA EXCISION     l eye  . SKIN CANCER EXCISION          Home Medications    Prior to Admission medications   Medication Sig Start Date End Date Taking? Authorizing Provider  sildenafil (REVATIO) 20 MG tablet TAKE 1 TO 5 TABLETS BY MOUTH DAILY AS NEEDED 09/18/17   Vivi Barrack, MD  sildenafil (VIAGRA) 25 MG tablet TAKE 1 TO 4 TABLETS BY MOUTH DAILY AS NEEDED FOR ERECTILE DYSFUNCTION 03/27/18   Vivi Barrack, MD  tamsulosin (FLOMAX) 0.4 MG CAPS capsule Take 1 capsule (0.4 mg total) by mouth daily. 08/18/17   Vivi Barrack, MD    Family History Family History  Problem Relation Age of Onset  . Heart disease Mother   . Cancer Father   . Heart attack Father        s/p CABG  . Diabetes Brother   . Colon cancer Neg Hx   . Esophageal cancer Neg Hx   . Ulcerative colitis Neg Hx   . Stomach cancer Neg Hx   . Rectal cancer Neg Hx     Social History Social History   Tobacco Use  . Smoking status: Current Every Day Smoker    Packs/day: 1.00    Years: 30.00    Pack years: 30.00    Types: Cigarettes   . Smokeless tobacco: Never Used  . Tobacco comment: 2 ppd for 35 years as of 2017  Substance Use Topics  . Alcohol use: No    Alcohol/week: 0.0 standard drinks  . Drug use: No     Allergies   Codeine   Review of Systems Review of Systems  Constitutional: Positive for fever.  HENT: Negative for congestion, ear pain and sore throat.   Respiratory: Positive for cough, shortness of breath and wheezing.   Cardiovascular: Positive for chest pain (more with coughing).  Gastrointestinal: Negative for abdominal pain and vomiting.  Genitourinary: Negative for dysuria.  Musculoskeletal: Positive for myalgias.  Neurological: Negative for syncope.  All other systems reviewed and are negative.    Physical Exam Updated Vital Signs BP (!) 176/92 (BP Location: Right Arm)   Pulse (!) 102   Temp 98.5 F (36.9 C) (Oral)   Resp 20   Ht 5\' 8"  (1.727 m)   Wt 63.5 kg   SpO2 95%   BMI 21.29 kg/m   Physical Exam Vitals signs and nursing note reviewed.  Constitutional:      General: He is not in acute distress.    Appearance: He is well-developed. He is not toxic-appearing.  HENT:     Head: Normocephalic and atraumatic.     Right Ear: Tympanic membrane, ear canal and external ear normal.     Left Ear: Tympanic membrane, ear canal and external ear normal.     Nose: Septal deviation and congestion present.     Right Sinus: No maxillary sinus tenderness or frontal sinus tenderness.     Left Sinus: No maxillary sinus tenderness or frontal sinus tenderness.     Mouth/Throat:     Pharynx: Oropharynx is clear. Uvula midline. No oropharyngeal exudate or posterior oropharyngeal erythema.     Comments: Mildly dry mucous membranes.  Eyes:     General:        Right eye: No discharge.        Left eye: No discharge.     Conjunctiva/sclera: Conjunctivae normal.  Neck:     Musculoskeletal: Neck supple. No edema, neck rigidity or crepitus.  Cardiovascular:     Rate and Rhythm: Regular rhythm.  Tachycardia present.     Heart sounds: No murmur.  Pulmonary:     Effort: No accessory muscle usage or respiratory distress.     Breath sounds: Decreased breath sounds (throughout with somewhat poor air movement. ) present. No wheezing, rhonchi or rales.     Comments: Intermittently coughing throughout exam. Able to speak in full sentences. SpO2 ranging 92-95% on RA.  Chest:     Chest wall: No tenderness.  Abdominal:  General: There is no distension.     Palpations: Abdomen is soft.     Tenderness: There is no abdominal tenderness.  Musculoskeletal:     Right lower leg: No edema.     Left lower leg: No edema.  Lymphadenopathy:     Cervical: No cervical adenopathy.  Skin:    General: Skin is warm and dry.     Findings: No rash.  Neurological:     Mental Status: He is alert.     Comments: Clear speech.   Psychiatric:        Behavior: Behavior normal.    ED Treatments / Results  Labs (all labs ordered are listed, but only abnormal results are displayed) Labs Reviewed  CBC WITH DIFFERENTIAL/PLATELET - Abnormal; Notable for the following components:      Result Value   WBC 13.9 (*)    Neutro Abs 12.0 (*)    Lymphs Abs 0.5 (*)    Monocytes Absolute 1.3 (*)    All other components within normal limits  BASIC METABOLIC PANEL - Abnormal; Notable for the following components:   Sodium 131 (*)    Chloride 96 (*)    Glucose, Bld 137 (*)    All other components within normal limits  RESPIRATORY PANEL BY PCR  INFLUENZA PANEL BY PCR (TYPE A & B)  HIV ANTIBODY (ROUTINE TESTING W REFLEX)  BASIC METABOLIC PANEL  CBC  I-STAT TROPONIN, ED    EKG EKG Interpretation  Date/Time:  Friday October 12 2018 14:40:21 EDT Ventricular Rate:  103 PR Interval:    QRS Duration: 82 QT Interval:  331 QTC Calculation: 434 R Axis:   80 Text Interpretation:  Sinus tachycardia Biatrial enlargement Anterior infarct, old Baseline wander in lead(s) V4 Since last tracing rate faster Confirmed by  Noemi Chapel [id number]) on 10/12/2018 2:46:19 PM   Radiology Dg Chest 2 View  Result Date: 10/12/2018 CLINICAL DATA:  Cough and shortness of breath EXAM: CHEST - 2 VIEW COMPARISON:  09/23/2015 FINDINGS: Cardiac shadow is within normal limits. The lungs are hyperinflated. No focal infiltrate or sizable effusion is seen. No acute bony abnormality is noted. IMPRESSION: No acute abnormality noted. Electronically Signed   By: Inez Catalina M.D.   On: 10/12/2018 16:31    Procedures Procedures (including critical care time)  Prior Results Reviewed:  Echo 08/25/2017 Study Conclusions  - Left ventricle: The cavity size was normal. Wall thickness was   normal. Systolic function was normal. The estimated ejection   fraction was in the range of 55% to 60%. Wall motion was normal;   there were no regional wall motion abnormalities. Doppler   parameters are consistent with abnormal left ventricular   relaxation (grade 1 diastolic dysfunction). - Aortic valve: Trileaflet; mildly thickened, mildly calcified   leaflets.  Impressions:  - Compared to the prior study, there has been no significant   interval change.   Left Heart Cath 09/24/2015  Mid RCA lesion, 60% stenosed. This is a small non-dominant vessel.  Mid Cx lesion, 40% stenosed.  Ost LAD to Mid LAD lesion, 20% stenosed.  The left ventricular systolic function is normal.   1. Mild to moderate non-obstructive CAD. There is a moderately severe stenosis in the small non-dominant RCA.  2. Normal LV systolic function  Recommendations: Long acting nitrates. Smoking cessation.  Medications Ordered in ED Medications  albuterol (PROVENTIL HFA;VENTOLIN HFA) 108 (90 Base) MCG/ACT inhaler 2 puff (has no administration in time range)  sodium chloride 0.9 % bolus 500  mL (500 mLs Intravenous New Bag/Given 10/12/18 1542)     Initial Impression / Assessment and Plan / ED Course  I have reviewed the triage vital signs and the nursing notes.   Pertinent labs & imaging results that were available during my care of the patient were reviewed by me and considered in my medical decision making (see chart for details).   Patient is a 63 year old male w/ hx of tobacco abuse- no formal diagnoses of COPD who presents to the ER for increased dyspnea & cough over the past 4 days. Patient nontoxic appearing, in no apparent distress, he is mildly tachycardic & BP is elevated. On exam he has some nasal congestion, mucous membranes a bit dry, and breath sounds are decreased throughout without overt adventitious sounds. SpO2 92-95% on RA initially. DDX: pneumonia, viral illness, undiagnosed underlying COPD w/ exacerbation, ACS, PE, pneumothorax, effusion, edema. Will further evaluate with basic labs, EKG & troponin (chest pain seems more cough related/atypical), and CXR. Will trial neb tx w/ hx of tobacco abuse and poor air movement, no overt wheezing. No travel or confirmed exposures to covid 19.   Work-up reviewed:  CBC: Leukocytosis @ 13.9 w/ left shift BMP: Mild hypochloremia/hyponatremia. Mild hyperglycemia.  Troponin: Negative EKG: No STEMI CXR: Negative for infiltrate, pneumothorax, effusion, edema.   16:40: Re-assessment: Patient remains with SpO2 in the low 90s, intermittently tachypneic throughout stay. Lungs w/ continued poor air movement, but remain without wheezing therefore do not feel role for steroids presently, but under consideration. Will test for influenza & trial ambulatory SpO2.  Influenza negative. Ambulatory SpO2 w/ desaturation to 87% on RA. Remains without auscultatory wheezing on my exam, inhaler seemed to help some. Possible viral process. Plan for admission at this time.   18:48: CONSULT: Discussed case with hospitalist Dr. Posey Pronto- accepts admission. Requesting add on RVP which has been ordered.    Findings and plan of care discussed with supervising physician Dr. Jeanell Sparrow who is in agreement.    Final Clinical  Impressions(s) / ED Diagnoses   Final diagnoses:  Hypoxia  Shortness of breath    ED Discharge Orders    None       Amaryllis Dyke, PA-C 10/12/18 1941    Amaryllis Dyke, PA-C 10/12/18 2322    Pattricia Boss, MD 10/13/18 1415

## 2018-10-12 NOTE — ED Notes (Signed)
ED TO INPATIENT HANDOFF REPORT  ED Nurse Name and Phone #: Mollee Neer [phone]  S Name/Age/Gender Olena Mater 63 y.o. male Room/Bed: 030C/030C  Code Status   Code Status: Prior  Home/SNF/Other Home Patient oriented to: self, place, time and situation Is this baseline? Yes   Triage Complete: Triage complete  Chief Complaint cp   Triage Note Pt here for evaluation of shortness of breath at rest x 1 week. Endorses productive cough with brown sputum, headache and "chest cavity pain from the cough."    Allergies Allergies  Allergen Reactions  . Codeine Nausea Only    Nausea     Level of Care/Admitting Diagnosis ED Disposition    ED Disposition Condition [facility]: [place] [[id number]]  Level of Care: Med-Surg [16]  I expect the patient will be discharged within 24 hours: Yes  LOW acuity---Tx typically complete <24 hrs---ACUTE conditions typically can be evaluated <24 hours---LABS likely to return to acceptable levels <24 hours---IS near functional baseline---EXPECTED to return to current living arrangement---NOT newly hypoxic: Meets criteria for 5C-Observation unit  Diagnosis: Hypoxia [086761]  Admitting Physician: Lenore Cordia [[id number]]  Attending Physician: Lenore Cordia [[id number]]  PT Class (Do Not Modify): Observation [104]  PT Acc Code (Do Not Modify): Observation [10022]       B Medical/Surgery History Past Medical History:  Diagnosis Date  . CAD in native artery-cath 09/24/15 non obstructive 09/24/2015  . Calf pain   . Cancer (HCC)    Skin  . Carotid artery occlusion   . Chest pain   . Dyspnea   . Hearing loss   . History of right-sided carotid endarterectomy 09/23/2015  . Hx of colonic polyp - ssp 06/21/2015  . Hypercholesteremia   . Hypertension   . Lower back pain   . Palpitations   . Tinnitus   . Tobacco use 09/23/2015  . Vertigo    Past Surgical History:  Procedure Laterality Date  . CARDIAC  CATHETERIZATION N/A 09/24/2015   Procedure: Left Heart Cath and Coronary Angiography;  Surgeon: Burnell Blanks, MD;  Location: [facility];  Service: Cardiovascular;  Laterality: N/A;  . CAROTID ENDARTERECTOMY  05/05/11   Right CEA  . MELANOMA EXCISION     l eye  . SKIN CANCER EXCISION       A IV Location/Drains/Wounds Patient Lines/Drains/Airways Status   Active Line/Drains/Airways    None          Intake/Output Last 24 hours No intake or output data in the 24 hours ending 10/12/18 1916  Labs/Imaging Results for orders placed or performed during the hospital encounter of 10/12/18 (from the past 48 hour(s))  CBC with Differential     Status: Abnormal   Collection Time: 10/12/18  3:43 PM  Result Value Ref Range   WBC 13.9 (H) 4.0 - 10.5 K/uL   RBC 5.13 4.22 - 5.81 MIL/uL   Hemoglobin 15.2 13.0 - 17.0 g/dL   HCT 45.0 39.0 - 52.0 %   MCV 87.7 80.0 - 100.0 fL   MCH 29.6 26.0 - 34.0 pg   MCHC 33.8 30.0 - 36.0 g/dL   RDW 12.5 11.5 - 15.5 %   Platelets 242 150 - 400 K/uL   nRBC 0.0 0.0 - 0.2 %   Neutrophils Relative % 85 %   Neutro Abs 12.0 (H) 1.7 - 7.7 K/uL   Lymphocytes Relative 4 %   Lymphs Abs 0.5 (L) 0.7 - 4.0 K/uL  Monocytes Relative 10 %   Monocytes Absolute 1.3 (H) 0.1 - 1.0 K/uL   Eosinophils Relative 0 %   Eosinophils Absolute 0.0 0.0 - 0.5 K/uL   Basophils Relative 0 %   Basophils Absolute 0.0 0.0 - 0.1 K/uL   Immature Granulocytes 1 %   Abs Immature Granulocytes 0.07 0.00 - 0.07 K/uL    Comment: Performed at [facility] [address] [place], [place] [address]  Basic metabolic panel     Status: Abnormal   Collection Time: 10/12/18  3:43 PM  Result Value Ref Range   Sodium 131 (L) 135 - 145 mmol/L   Potassium 4.0 3.5 - 5.1 mmol/L   Chloride 96 (L) 98 - 111 mmol/L   CO2 23 22 - 32 mmol/L   Glucose, Bld 137 (H) 70 - 99 mg/dL   BUN 16 8 - 23 mg/dL   Creatinine, Ser 0.77 0.61 - 1.24 mg/dL   Calcium 8.9 8.9 - 10.3 mg/dL   GFR calc  non Af Amer >60 >60 mL/min   GFR calc Af Amer >60 >60 mL/min   Anion gap 12 5 - 15    Comment: Performed at [facility] [address] [place], [place] [address]  I-Stat Troponin, ED (not at [facility])     Status: None   Collection Time: 10/12/18  3:48 PM  Result Value Ref Range   Troponin i, poc 0.00 0.00 - 0.08 ng/mL   Comment 3            Comment: Due to the release kinetics of cTnI, a negative result within the first hours of the onset of symptoms does not rule out myocardial infarction with certainty. If myocardial infarction is still suspected, repeat the test at appropriate intervals.   Influenza panel by PCR (type A & B)     Status: None   Collection Time: 10/12/18  4:44 PM  Result Value Ref Range   Influenza A By PCR NEGATIVE NEGATIVE   Influenza B By PCR NEGATIVE NEGATIVE    Comment: (NOTE) The Xpert Xpress Flu assay is intended as an aid in the diagnosis of  influenza and should not be used as a sole basis for treatment.  This  assay is FDA approved for nasopharyngeal swab specimens only. Nasal  washings and aspirates are unacceptable for Xpert Xpress Flu testing. Performed at [facility], [place] [address], [place], [place] [address]    Dg Chest 2 View  Result Date: 10/12/2018 CLINICAL DATA:  Cough and shortness of breath EXAM: CHEST - 2 VIEW COMPARISON:  09/23/2015 FINDINGS: Cardiac shadow is within normal limits. The lungs are hyperinflated. No focal infiltrate or sizable effusion is seen. No acute bony abnormality is noted. IMPRESSION: No acute abnormality noted. Electronically Signed   By: Inez Catalina M.D.   On: 10/12/2018 16:31    Pending Labs Unresulted Labs (From admission, onward)    Start     Ordered   10/12/18 1849  Respiratory Panel by PCR  (Respiratory virus panel with precautions)  Once,   R     10/12/18 1849          Vitals/Pain Today's Vitals   10/12/18 1715 10/12/18 1718 10/12/18 1730 10/12/18 1745  BP: (!) 155/77  139/80 (!) 150/88   Pulse: (!) 106  94 (!) 107  Resp: (!) 21  (!) 22 (!) 31  Temp:      TempSrc:      SpO2: 93%  94%  91%  Weight:      Height:      PainSc:  0-No pain      Isolation Precautions Droplet precaution  Medications Medications  sodium chloride 0.9 % bolus 500 mL (has no administration in time range)  sodium chloride 0.9 % bolus 500 mL (0 mLs Intravenous Stopped 10/12/18 1720)  albuterol (PROVENTIL HFA;VENTOLIN HFA) 108 (90 Base) MCG/ACT inhaler 2 puff (2 puffs Inhalation Given 10/12/18 1643)    Mobility walks Low fall risk   Focused Assessments Pulmonary Assessment Handoff:  Lung sounds:   O2 Device: Room Air        R Recommendations: See Admitting Provider Note  Report given to:   Additional Notes:

## 2018-10-13 DIAGNOSIS — J40 Bronchitis, not specified as acute or chronic: Secondary | ICD-10-CM | POA: Diagnosis not present

## 2018-10-13 DIAGNOSIS — I10 Essential (primary) hypertension: Secondary | ICD-10-CM

## 2018-10-13 DIAGNOSIS — I2510 Atherosclerotic heart disease of native coronary artery without angina pectoris: Secondary | ICD-10-CM

## 2018-10-13 DIAGNOSIS — Z720 Tobacco use: Secondary | ICD-10-CM

## 2018-10-13 DIAGNOSIS — R0902 Hypoxemia: Secondary | ICD-10-CM | POA: Diagnosis not present

## 2018-10-13 DIAGNOSIS — E785 Hyperlipidemia, unspecified: Secondary | ICD-10-CM | POA: Diagnosis not present

## 2018-10-13 LAB — BASIC METABOLIC PANEL
Anion gap: 13 (ref 5–15)
BUN: 13 mg/dL (ref 8–23)
CO2: 25 mmol/L (ref 22–32)
Calcium: 9 mg/dL (ref 8.9–10.3)
Chloride: 95 mmol/L — ABNORMAL LOW (ref 98–111)
Creatinine, Ser: 0.82 mg/dL (ref 0.61–1.24)
GFR calc Af Amer: >60 mL/min (ref >60)
Glucose, Bld: 127 mg/dL — ABNORMAL HIGH (ref 70–99)
POTASSIUM: 4.1 mmol/L (ref 3.5–5.1)
Sodium: 133 mmol/L — ABNORMAL LOW (ref 135–145)

## 2018-10-13 LAB — CBC
HCT: 44.8 % (ref 39.0–52.0)
Hemoglobin: 15 g/dL (ref 13.0–17.0)
MCH: 29.6 pg (ref 26.0–34.0)
MCHC: 33.5 g/dL (ref 30.0–36.0)
MCV: 88.4 fL (ref 80.0–100.0)
PLATELETS: 257 10*3/uL (ref 150–400)
RBC: 5.07 MIL/uL (ref 4.22–5.81)
RDW: 12.6 % (ref 11.5–15.5)
WBC: 13.6 10*3/uL — ABNORMAL HIGH (ref 4.0–10.5)
nRBC: 0 % (ref 0.0–0.2)

## 2018-10-13 LAB — HIV ANTIBODY (ROUTINE TESTING W REFLEX): HIV SCREEN 4TH GENERATION: NONREACTIVE

## 2018-10-13 LAB — PROCALCITONIN: Procalcitonin: 0.2 ng/mL

## 2018-10-13 NOTE — Progress Notes (Signed)
SATURATION QUALIFICATIONS: (This note is used to comply with regulatory documentation for home oxygen)  Patient Saturations on Room Air at Rest = 93%  Patient Saturations on Room Air while Ambulating = 89%  Patient Saturations on 2 Liters of oxygen while Ambulating = 100%  Pt ambulated 75 feet, MD aware of results, pt tolerated fair

## 2018-10-14 DIAGNOSIS — J40 Bronchitis, not specified as acute or chronic: Secondary | ICD-10-CM | POA: Diagnosis not present

## 2018-10-14 DIAGNOSIS — R0902 Hypoxemia: Secondary | ICD-10-CM | POA: Diagnosis not present

## 2018-10-14 DIAGNOSIS — I2510 Atherosclerotic heart disease of native coronary artery without angina pectoris: Secondary | ICD-10-CM | POA: Diagnosis not present

## 2018-10-14 DIAGNOSIS — D72829 Elevated white blood cell count, unspecified: Secondary | ICD-10-CM

## 2018-10-14 DIAGNOSIS — R0602 Shortness of breath: Secondary | ICD-10-CM

## 2018-10-14 DIAGNOSIS — E871 Hypo-osmolality and hyponatremia: Secondary | ICD-10-CM

## 2018-10-14 LAB — EXPECTORATED SPUTUM ASSESSMENT W GRAM STAIN, RFLX TO RESP C

## 2018-10-14 LAB — EXPECTORATED SPUTUM ASSESSMENT W REFEX TO RESP CULTURE: SPECIAL REQUESTS: NORMAL

## 2018-10-14 LAB — PROCALCITONIN: Procalcitonin: 0.34 ng/mL

## 2018-10-14 MED ORDER — PREDNISONE 20 MG PO TABS: 40.0000 mg | ORAL_TABLET | Freq: Every day | ORAL | Status: DC

## 2018-10-14 MED ORDER — NICOTINE 21 MG/24HR TD PT24: 21.0000 mg | MEDICATED_PATCH | Freq: Every day | TRANSDERMAL | 0 refills | Status: DC

## 2018-10-14 MED ORDER — NEBULIZER DEVI: 1.0000 | 0 refills | Status: DC | PRN

## 2018-10-14 MED ORDER — IPRATROPIUM-ALBUTEROL 0.5-2.5 (3) MG/3ML IN SOLN: 3.0000 mL | Freq: Four times a day (QID) | RESPIRATORY_TRACT | 0 refills | Status: DC | PRN

## 2018-10-14 MED ORDER — ALBUTEROL SULFATE (2.5 MG/3ML) 0.083% IN NEBU
2.5000 mg | INHALATION_SOLUTION | Freq: Two times a day (BID) | RESPIRATORY_TRACT | Status: DC
  Administered 2018-10-14: 2.5 mg via RESPIRATORY_TRACT
  Filled 2018-10-14: qty 3

## 2018-10-14 MED ORDER — CARVEDILOL 3.125 MG PO TABS: 3.1250 mg | ORAL_TABLET | Freq: Two times a day (BID) | ORAL | 0 refills | Status: DC

## 2018-10-14 MED ORDER — ASPIRIN 81 MG PO TBEC: 81.0000 mg | DELAYED_RELEASE_TABLET | Freq: Every day | ORAL | 0 refills | Status: DC

## 2018-10-14 MED ORDER — ATORVASTATIN CALCIUM 40 MG PO TABS: 40.0000 mg | ORAL_TABLET | Freq: Every day | ORAL | 0 refills | Status: DC

## 2018-10-14 MED ORDER — GUAIFENESIN ER 600 MG PO TB12: 1200.0000 mg | ORAL_TABLET | Freq: Two times a day (BID) | ORAL | Status: DC

## 2018-10-14 MED ORDER — CARVEDILOL 3.125 MG PO TABS
3.1250 mg | ORAL_TABLET | Freq: Two times a day (BID) | ORAL | Status: DC
  Administered 2018-10-14: 3.125 mg via ORAL
  Filled 2018-10-14: qty 1

## 2018-10-14 MED ORDER — PREDNISONE 20 MG PO TABS: ORAL_TABLET | ORAL | 0 refills | Status: DC

## 2018-10-14 MED ORDER — METHYLPREDNISOLONE SODIUM SUCC 125 MG IJ SOLR
60.0000 mg | Freq: Once | INTRAMUSCULAR | Status: AC
  Administered 2018-10-14: 60 mg via INTRAVENOUS
  Filled 2018-10-14: qty 2

## 2018-10-14 MED ORDER — LEVOFLOXACIN 500 MG PO TABS: 500.0000 mg | ORAL_TABLET | Freq: Every day | ORAL | 0 refills | Status: DC

## 2018-10-14 MED ORDER — LEVOFLOXACIN 500 MG PO TABS
500.0000 mg | ORAL_TABLET | Freq: Every day | ORAL | Status: DC
  Administered 2018-10-14: 500 mg via ORAL
  Filled 2018-10-14: qty 1

## 2018-10-14 NOTE — Progress Notes (Signed)
Message Dr. Jeannette Corpus patient's blood pressure is 170/72

## 2018-10-14 NOTE — Progress Notes (Signed)
Pt discharge education provided at bedside  Pt has all belongings including printed prescriptions, out of work letter and AVS  IV removed, catheter intact and telemetry removed  Awaiting pt transportation

## 2018-10-14 NOTE — Progress Notes (Signed)
Progress Note    Jackson Mathews  ION:[id number] DOB: 24-Oct-1955  DOA: 10/12/2018 PCP: Vivi Barrack, MD    Brief Narrative:   Chief complaint: Shortness of breath and cough  Medical records reviewed and are as summarized below:  Jackson Mathews is an 63 y.o. male with past medical history significant for CAD (mild to moderate nonobstructive by [facility] 2017), hypertension, hyperlipidemia, PVD, CAS s/p Rt CEA 2012, and chronic tobacco use (2 PPD for 45 years); who presented to the emergency department with complaints of cough and shortness of breath over the last 1 week and patient of brown sputum.  Assessment/Plan:   Principal Problem:   Hypoxia Active Problems:   CAD in native artery-cath 09/24/15 non obstructive   Essential hypertension   HLD (hyperlipidemia)   Tobacco use   Bronchitis due to tobacco use  Hypoxia, chronic bronchitis due to tobacco abuse: Patient reports 90 smoking pack year history and likely has COPD but never has had formal testing.  Chest x-ray showed no acute infiltrate or consolidation.  Influenza screen and respiratory virus panel was negative.  O2 saturations noted to drop to 89% on ambulating with nursing staff. -Continue nasal cannula oxygen and wean to room air as able -Albuterol breathing treatments as needed -Continue azithromycin -Mucinex -Counseled on the need of cessation of tobacco use -Nicotine patch provided for tobacco abuse  CAD: Patient with previous history of moderate nonobstructive CAD with moderately severe stenosis in the small nondominant RCA per last left heart cath on 09/2015.  Denies any chest pain and initial troponin negative.  Patient had not been on aspirin or statin.  Both started after discussion at admission. -Continue aspirin and statin  Essential hypertension: -Hydralazine IV as needed -Will consider starting Coreg  Hyperlipidemia: Patient was not taking statin at home. -Continue atorvastatin  Leukocytosis: WBC  elevated at 13.  Chest x-ray otherwise does not note any focal consolidation and he has remained afebrile. -Check procalcitonin -Continue to monitor  Hyponatremia: Sodium 131 admission and improved to 133 today.  Suspect to be secondary to poor p.o. intake.   Body mass index is 21.29 kg/m.   Family Communication/Anticipated D/C date and plan/Code Status   DVT prophylaxis: Lovenox ordered. Code Status: Full Code.  Family Communication: No family present at bedside Disposition Plan: Likely discharge home in 1 to 2 days   Medical Consultants:    None.   Anti-Infectives:    Azithromycin day 2  Subjective:   Patient reports symptoms of the last 1 week more severe, but actually had possibly been present 3 weeks.  Complains of shortness of breath, wheezing, productive cough with thick brown and now also.  Green sputum production, and intermittent hot flashes.  Patient was around a [place] spill 1 month ago with work, and reports that it made his breathing worse.  Uppercase he reports quitting smoking just a couple days ago due to difficulty breathing.  Admits to smoking approximately 45 years.  Objective:    Vitals:   10/13/18 0908 10/13/18 1151 10/13/18 1800 10/14/18 0017  BP: (!) 153/84 130/86 (!) 145/80 (!) 170/72  Pulse: 87 92 (!) 103 (!) 101  Resp:  18 18 18   Temp: 98.1 F (36.7 C) 98 F (36.7 C) 98.8 F (37.1 C) 98.5 F (36.9 C)  TempSrc: Oral Oral Oral Oral  SpO2:  95% 95% 95%  Weight:      Height:        Intake/Output Summary (Last 24 hours) at  10/14/2018 0514 Last data filed at 10/13/2018 1253 Gross per 24 hour  Intake 120 ml  Output -  Net 120 ml   Filed Weights   10/12/18 1439  Weight: 63.5 kg    Exam: Constitutional: Thin male who appears older than stated age NAD, calm, comfortable Eyes: PERRL, lids and conjunctivae normal ENMT: Mucous membranes are moist. Posterior pharynx clear of any exudate or lesions.  Neck: normal, supple, no masses, no  thyromegaly Respiratory: clear to auscultation bilaterally, no wheezing, no crackles. Normal respiratory effort. No accessory muscle use.  Cardiovascular: Decreased overall aeration with prolonged expiratory phase.  No significant rhonchi or crackles appreciated.  Patient currently on 2 L nasal cannula oxygen with normal respiratory effort.  Emesis bag has thick green sputum present. Abdomen: no tenderness, no masses palpated. No hepatosplenomegaly. Bowel sounds positive.  Musculoskeletal: no clubbing / cyanosis. No joint deformity upper and lower extremities. Good ROM, no contractures. Normal muscle tone.  Skin: no rashes, lesions, ulcers. No induration Neurologic: CN 2-12 grossly intact. Sensation intact, DTR normal. Strength 5/5 in all 4.  Psychiatric: Normal judgment and insight. Alert and oriented x 3. Normal mood.    Data Reviewed:   I have personally reviewed following labs and imaging studies:  Labs: Labs show the following:   Basic Metabolic Panel: Recent Labs  Lab 10/12/18 1543 10/13/18 0409  NA 131* 133*  K 4.0 4.1  CL 96* 95*  CO2 23 25  GLUCOSE 137* 127*  BUN 16 13  CREATININE 0.77 0.82  CALCIUM 8.9 9.0   GFR Estimated Creatinine Clearance: 85 mL/min (by C-G formula based on SCr of 0.82 mg/dL). Liver Function Tests: No results for input(s): AST, ALT, ALKPHOS, BILITOT, PROT, ALBUMIN in the last 168 hours. No results for input(s): LIPASE, AMYLASE in the last 168 hours. No results for input(s): AMMONIA in the last 168 hours. Coagulation profile No results for input(s): INR, PROTIME in the last 168 hours.  CBC: Recent Labs  Lab 10/12/18 1543 10/13/18 0409  WBC 13.9* 13.6*  NEUTROABS 12.0*  --   HGB 15.2 15.0  HCT 45.0 44.8  MCV 87.7 88.4  PLT 242 257   Cardiac Enzymes: No results for input(s): CKTOTAL, CKMB, CKMBINDEX, TROPONINI in the last 168 hours. BNP (last 3 results) No results for input(s): PROBNP in the last 8760 hours. CBG: No results for  input(s): GLUCAP in the last 168 hours. D-Dimer: No results for input(s): DDIMER in the last 72 hours. Hgb A1c: No results for input(s): HGBA1C in the last 72 hours. Lipid Profile: No results for input(s): CHOL, HDL, LDLCALC, TRIG, CHOLHDL, LDLDIRECT in the last 72 hours. Thyroid function studies: No results for input(s): TSH, T4TOTAL, T3FREE, THYROIDAB in the last 72 hours.  Invalid input(s): FREET3 Anemia work up: No results for input(s): VITAMINB12, FOLATE, FERRITIN, TIBC, IRON, RETICCTPCT in the last 72 hours. Sepsis Labs: Recent Labs  Lab 10/12/18 1543 10/13/18 0409  PROCALCITON  --  0.20  WBC 13.9* 13.6*    Microbiology Recent Results (from the past 240 hour(s))  Respiratory Panel by PCR     Status: None   Collection Time: 10/12/18  4:44 PM  Result Value Ref Range Status   Adenovirus NOT DETECTED NOT DETECTED Final   Coronavirus 229E NOT DETECTED NOT DETECTED Final    Comment: (NOTE) The Coronavirus on the Respiratory Panel, DOES NOT test for the novel  Coronavirus (2019 nCoV)    Coronavirus HKU1 NOT DETECTED NOT DETECTED Final   Coronavirus NL63 NOT  DETECTED NOT DETECTED Final   Coronavirus OC43 NOT DETECTED NOT DETECTED Final   Metapneumovirus NOT DETECTED NOT DETECTED Final   Rhinovirus / Enterovirus NOT DETECTED NOT DETECTED Final   Influenza A NOT DETECTED NOT DETECTED Final   Influenza B NOT DETECTED NOT DETECTED Final   Parainfluenza Virus 1 NOT DETECTED NOT DETECTED Final   Parainfluenza Virus 2 NOT DETECTED NOT DETECTED Final   Parainfluenza Virus 3 NOT DETECTED NOT DETECTED Final   Parainfluenza Virus 4 NOT DETECTED NOT DETECTED Final   Respiratory Syncytial Virus NOT DETECTED NOT DETECTED Final   Bordetella pertussis NOT DETECTED NOT DETECTED Final   Chlamydophila pneumoniae NOT DETECTED NOT DETECTED Final   Mycoplasma pneumoniae NOT DETECTED NOT DETECTED Final    Comment: Performed at [facility], [place] [address] [place], [place] [address]     Procedures and diagnostic studies:  Dg Chest 2 View  Result Date: 10/12/2018 CLINICAL DATA:  Cough and shortness of breath EXAM: CHEST - 2 VIEW COMPARISON:  09/23/2015 FINDINGS: Cardiac shadow is within normal limits. The lungs are hyperinflated. No focal infiltrate or sizable effusion is seen. No acute bony abnormality is noted. IMPRESSION: No acute abnormality noted. Electronically Signed   By: Inez Catalina M.D.   On: 10/12/2018 16:31    Medications:   . aspirin EC  81 mg Oral Daily  . atorvastatin  40 mg Oral q1800  . azithromycin  250 mg Oral Daily  . enoxaparin (LOVENOX) injection  40 mg Subcutaneous Q24H  . guaiFENesin  1,200 mg Oral BID  . nicotine  21 mg Transdermal Daily   Continuous Infusions:   LOS: 0 days    A   [facility]   *Please refer to [email or web address], password [email or web address] to get updated schedule on who will round on this patient, as hospitalists switch teams weekly. If 7PM-7AM, please contact night-coverage at [email or web address], password [email or web address] for any overnight needs.

## 2018-10-14 NOTE — Progress Notes (Signed)
SATURATION QUALIFICATIONS: (This note is used to comply with regulatory documentation for home oxygen)  Patient Saturations on Room Air at Rest = 92%  Patient Saturations on Room Air while Ambulating = 91%   Pt ambulated 150 feet with NT, pt tolerated well, no assistive devices used

## 2018-10-14 NOTE — Discharge Summary (Signed)
Jackson Mathews, is a 63 y.o. male  DOB July 01, 1956  MRN [id number].  Admission date:  10/12/2018  Admitting Physician  Lenore Cordia, MD  Discharge Date:  10/14/2018   Primary MD  Vivi Barrack, MD  Recommendations for primary care physician for things to follow:   Sputum culture  Discharge Diagnosis    Principal Problem:   Bronchitis due to tobacco use Active Problems:   CAD in native artery-cath 09/24/15 non obstructive   Essential hypertension   HLD (hyperlipidemia)   Tobacco use   Hyponatremia   Leukocytosis      Past Medical History:  Diagnosis Date   CAD in native artery-cath 09/24/15 non obstructive 09/24/2015   Calf pain    Cancer (HCC)    Skin   Carotid artery occlusion    Chest pain    Dyspnea    Hearing loss    History of right-sided carotid endarterectomy 09/23/2015   Hx of colonic polyp - ssp 06/21/2015   Hypercholesteremia    Hypertension    Lower back pain    Palpitations    Tinnitus    Tobacco use 09/23/2015   Vertigo     Past Surgical History:  Procedure Laterality Date   CARDIAC CATHETERIZATION N/A 09/24/2015   Procedure: Left Heart Cath and Coronary Angiography;  Surgeon: Burnell Blanks, MD;  Location: [facility];  Service: Cardiovascular;  Laterality: N/A;   CAROTID ENDARTERECTOMY  05/05/11   Right CEA   MELANOMA EXCISION     l eye   SKIN CANCER EXCISION         HPI  from the history and physical done on the day of admission:  ARMAND PREAST is a 63 y.o. male with medical history significant for CAD (mild to moderate nonobstructive by [facility] 2017), hypertension, hyperlipidemia, PVD, CAS s/p Rt CEA 2012, and chronic tobacco use (2 PPD for 45 years) who presents the ED with about 1 week of new onset shortness of breath and cough productive of  occasional brown sputum.  He has had associated musculoskeletal chest wall pain exacerbated by coughing.  He reports low-grade temperatures at home, highest of 100.4 Fahrenheit.  He denies any diaphoresis, chills, palpitations, typical chest pain, nausea, vomiting, abdominal pain, diarrhea, myalgias, or peripheral edema.  He denies any hemoptysis.    Patient is a chronic smoker with 90 pack years and at his baseline he does not have any dyspnea on exertion or cough.  He says he is not currently taking any medications other than recently using Delsym for his cough with some relief.  He denies any recent travel out of the city or in an airport.  He denies any known sick contacts.  ED Course:  In the ED initial vitals showed BP 142/81, pulse 105, RR 23, temp 98.5 Fahrenheit, SPO2 93% on room air.  Labs are notable for WBC 13.9, hemoglobin 15.2, platelets 242, sodium 131, potassium 4.0, serum glucose 137, BUN 16, creatinine 0.77, stat troponin 0.00.  Influenza panel  was negative.  RSV panel is obtained and pending.  2 view chest x-ray showed hyperexpanded lung fields consolidation, effusion, or pulmonary edema.  Patient was given 1 L of normal saline and an albuterol inhaler treatment with symptomatic improvement.  On repeat ambulation he had oxygen desaturation to 87% on room air.  The hospitalist service was consulted to admit for further evaluation and management of hypoxia.    Hospital Course:   1.  Acute bronchitis due to tobacco abuse: Patient reports 90 smoking pack year history and likely has COPD but never has had formal testing.  Chest x-ray showed no acute infiltrate or consolidation.  Influenza screen and respiratory virus panel was negative.  Procalcitonin was noted to be mildly elevated 0.2, and repeat check the following day 0.34.  Patient had initially been started on antibiotics of azithromycin. O2 saturations noted to drop onto 89% on ambulating with nursing staff on hospital day  2.  Due to concern for possible underlying infection antibiotics changed to Levaquin to complete a 5-day course.  He was also given a short course of steroids due to intermittent wheezing noted during his hospital stay. An ambulatory referral was sent for patient to be seen by pulmonology for pulmonary function testing treatment for suspected COPD.  Encourage patient on the need of smoking cessation and he was supplied with a nicotine patch while in hospital.  2.  History of coronary artery disease: Patient with previous history of moderate nonobstructive CAD with moderately severe stenosis in the small nondominant RCA per last left heart cath on 09/2015.  Denies any chest pain and initial troponin negative.  Patient had not been on aspirin or statin.  Both started after discussion at admission. -Continue aspirin and statin  3.  Essential hypertension:: During the patient's hospital blood pressure was noted to be elevated 151/91-172/86.  He was started on low-dose Coreg.  Primary care provider to follow-up on blood pressures and adjust medications as needed.  4.  Hyperlipidemia: Patient was not taking statin at home, but due to his history of coronary artery disease was restarted on atorvastatin.  Primary care provider to follow-up on monitoring lab work.  5.  Leukocytosis: WBC elevated up to 13.6.  Chest x-ray otherwise does not note any focal consolidation and he has remained afebrile.  Question the possibility of underlying infection, but patient was already on empiric antibiotic.  6.  Hyponatremia: Resolving.  Sodium 131 on admission and 133 on recheck the following day.  Suspect to be secondary to poor p.o. intake.     Follow UP  Follow-up Information    Vivi Barrack, MD Follow up.   Specialty:  Family Medicine Why:  Call and make appointment within 1 to 2 weeks Contact information: [place] [address] [phone]            Consults obtained :  None  Discharge Condition: Stable  Diet and Activity recommendation: See Discharge Instructions below    Discharge Instructions    Ambulatory referral to Pulmonology   Complete by:  As directed    90 smoking pack year history of tobacco abuse evaluation for COPD   Discharge instructions   Complete by:  As directed    Follow with Primary MD Vivi Barrack, MD within 1 to 2 weeks.  You are diagnosed you with bronchitis secondary to history of smoking.  Due to the possibility of underlying infection you are placed on antibiotics of azithromycin and then switch to Levaquin to  complete a total 7-day course.  Please take albuterol in a letter with you at discharge.  Prescription was sent to your pharmacy for nebulizer machine and solution.  Referral was made for you to be seen by pulmonology.  Your primary doctor will need to continue and adjust your medications for blood pressure and cholesterol.  Get CBC and CMP-  checked  by Primary MD within 1 to 2 weeks ( we routinely change or add medications that can affect your baseline labs and fluid status, therefore we recommend that you get the mentioned basic workup next visit with your PCP, your PCP may decide not to get them or add new tests based on their clinical decision)  Disposition: Home   Diet: Heart Healthy  Special Instructions: If you have smoked or chewed Tobacco  in the last 2 yrs please stop smoking, stop any regular Alcohol  and or any Recreational drug use.  On your next visit with your primary care physician please Get Medicines reviewed and adjusted.  Please request your Vivi Barrack, MD to go over all Hospital Tests and Procedure/Radiological results at the follow up, please get all Hospital records sent to your Prim MD by signing hospital release before you go home.  If you experience worsening of your admission symptoms, develop shortness of breath, life threatening emergency, suicidal or homicidal thoughts you must seek  medical attention immediately by calling 911 or calling your MD immediately  if symptoms less severe.  You Must read complete instructions/literature along with all the possible adverse reactions/side effects for all the Medicines you take and that have been prescribed to you. Take any new Medicines after you have completely understood and accpet all the possible adverse reactions/side effects.   Do not drive, operate heavy machinery, perform activities at heights, swimming or participation in water activities or provide baby sitting services if your were admitted for syncope or siezures until you have seen by Primary MD or a Neurologist and advised to do so again.  Do not drive when taking Pain medications.  Do not take more than prescribed Pain, Sleep and Anxiety Medications  Wear Seat belts while driving.   Please note  You were cared for by a hospitalist during your hospital stay. If you have any questions about your discharge medications or the care you received while you were in the hospital after you are discharged, you can call the unit and asked to speak with the hospitalist on call if the hospitalist that took care of you is not available. Once you are discharged, your primary care physician will handle any further medical issues. Please note that NO REFILLS for any discharge medications will be authorized once you are discharged, as it is imperative that you return to your primary care physician (or establish a relationship with a primary care physician if you do not have one) for your aftercare needs so that they can reassess your need for medications and monitor your lab values.   Increase activity slowly   Complete by:  As directed         Discharge Medications     Allergies as of 10/14/2018      Reactions   Codeine Nausea Only   Nausea      Medication List    STOP taking these medications   sildenafil 20 MG tablet Commonly known as:  REVATIO   sildenafil 25 MG  tablet Commonly known as:  VIAGRA   tamsulosin 0.4 MG Caps capsule Commonly known  as:  FLOMAX     TAKE these medications   aspirin 81 MG EC tablet Take 1 tablet (81 mg total) by mouth daily. Start taking on:  October 15, 2018   atorvastatin 40 MG tablet Commonly known as:  LIPITOR Take 1 tablet (40 mg total) by mouth daily at 6 PM.   carvedilol 3.125 MG tablet Commonly known as:  COREG Take 1 tablet (3.125 mg total) by mouth 2 (two) times daily with a meal.   guaiFENesin 600 MG 12 hr tablet Commonly known as:  MUCINEX Take 2 tablets (1,200 mg total) by mouth 2 (two) times daily.   ipratropium-albuterol 0.5-2.5 (3) MG/3ML Soln Commonly known as:  DUONEB Take 3 mLs by nebulization every 6 (six) hours as needed (Shortness of breath or wheezing).   levofloxacin 500 MG tablet Commonly known as:  LEVAQUIN Take 1 tablet (500 mg total) by mouth daily. Start taking on:  October 15, 2018   Nebulizer Devi 1 Device by Does not apply route as needed.   nicotine 21 mg/24hr patch Commonly known as:  NICODERM CQ - dosed in mg/24 hours Place 1 patch (21 mg total) onto the skin daily. Start taking on:  October 15, 2018   predniSONE 20 MG tablet Commonly known as:  DELTASONE Take prednisone 40 mg p.o. daily x 2 days, then take 20 mg p.o. daily x 3 days, then stop.       Major procedures and Radiology Reports - PLEASE review detailed and final reports for all details, in brief -     Dg Chest 2 View  Result Date: 10/12/2018 CLINICAL DATA:  Cough and shortness of breath EXAM: CHEST - 2 VIEW COMPARISON:  09/23/2015 FINDINGS: Cardiac shadow is within normal limits. The lungs are hyperinflated. No focal infiltrate or sizable effusion is seen. No acute bony abnormality is noted. IMPRESSION: No acute abnormality noted. Electronically Signed   By: Inez Catalina M.D.   On: 10/12/2018 16:31    Micro Results    Recent Results (from the past 240 hour(s))  Respiratory Panel by PCR     Status:  None   Collection Time: 10/12/18  4:44 PM  Result Value Ref Range Status   Adenovirus NOT DETECTED NOT DETECTED Final   Coronavirus 229E NOT DETECTED NOT DETECTED Final    Comment: (NOTE) The Coronavirus on the Respiratory Panel, DOES NOT test for the novel  Coronavirus (2019 nCoV)    Coronavirus HKU1 NOT DETECTED NOT DETECTED Final   Coronavirus NL63 NOT DETECTED NOT DETECTED Final   Coronavirus OC43 NOT DETECTED NOT DETECTED Final   Metapneumovirus NOT DETECTED NOT DETECTED Final   Rhinovirus / Enterovirus NOT DETECTED NOT DETECTED Final   Influenza A NOT DETECTED NOT DETECTED Final   Influenza B NOT DETECTED NOT DETECTED Final   Parainfluenza Virus 1 NOT DETECTED NOT DETECTED Final   Parainfluenza Virus 2 NOT DETECTED NOT DETECTED Final   Parainfluenza Virus 3 NOT DETECTED NOT DETECTED Final   Parainfluenza Virus 4 NOT DETECTED NOT DETECTED Final   Respiratory Syncytial Virus NOT DETECTED NOT DETECTED Final   Bordetella pertussis NOT DETECTED NOT DETECTED Final   Chlamydophila pneumoniae NOT DETECTED NOT DETECTED Final   Mycoplasma pneumoniae NOT DETECTED NOT DETECTED Final    Comment: Performed at [facility], [address], [place], [place] [address]       Today   Subjective    Jackson Mathews today states that his breathing is improving, but notes that he still  coughing up a significant amount of mucus.  Does note that he still is intermittently wheezing and times winded with activity.  Objective   Blood pressure (!) 151/91, pulse 88, temperature 97.9 F (36.6 C), temperature source Oral, resp. rate 18, height 5\' 8"  (1.727 m), weight 63.5 kg, SpO2 92 %.  No intake or output data in the 24 hours ending 10/14/18 1432  Exam  Constitutional: Thin elderly appearing male in NAD, calm, comfortable Eyes: PERRL, lids and conjunctivae normal ENMT: Mucous membranes are moist. Posterior pharynx clear of any exudate or lesions.   Neck: normal, supple, no masses, no  thyromegaly Respiratory: Decreased overall aeration with mild intermittent wheezes appreciated.  Patient maintaining O2 saturations on room air and able to talk in complete sentences at this time. Cardiovascular: Regular rate and rhythm, no murmurs / rubs / gallops. No extremity edema. 2+ pedal pulses. No carotid bruits.  Abdomen: no tenderness, no masses palpated. No hepatosplenomegaly. Bowel sounds positive.  Musculoskeletal: no clubbing / cyanosis. No joint deformity upper and lower extremities. Good ROM, no contractures. Normal muscle tone.  Skin: no rashes, lesions, ulcers. No induration Neurologic: CN 2-12 grossly intact. Sensation intact, DTR normal. Strength 5/5 in all 4.  Psychiatric: Normal judgment and insight. Alert and oriented x 3. Normal mood.    Data Review   CBC w Diff:  Lab Results  Component Value Date   WBC 13.6 (H) 10/13/2018   HGB 15.0 10/13/2018   HCT 44.8 10/13/2018   PLT 257 10/13/2018   LYMPHOPCT 4 10/12/2018   MONOPCT 10 10/12/2018   EOSPCT 0 10/12/2018   BASOPCT 0 10/12/2018    CMP:  Lab Results  Component Value Date   NA 133 (L) 10/13/2018   K 4.1 10/13/2018   CL 95 (L) 10/13/2018   CO2 25 10/13/2018   BUN 13 10/13/2018   CREATININE 0.82 10/13/2018   PROT 7.3 05/22/2014   ALBUMIN 4.0 05/22/2014   BILITOT 0.4 05/22/2014   ALKPHOS 128 (H) 05/22/2014   AST 16 05/22/2014   ALT 9 05/22/2014  .   Total Time in preparing paper work, data evaluation and todays exam - 35 minutes  Norval Morton M.D on 10/14/2018 at 2:32 PM  [facility]   Office  [phone]

## 2018-10-14 NOTE — Progress Notes (Signed)
Pt discharged, pt ambulated off unit with staff

## 2018-10-14 NOTE — Progress Notes (Signed)
Dr. Kennon Holter states "noted". No new orders given.

## 2018-10-14 NOTE — Progress Notes (Signed)
Provided pt with sputum container  Educated on how to use  Pt verbalizes understanding  Educated pt on all new medications and provided hand outs  Will continue to monitor

## 2018-10-19 ENCOUNTER — Telehealth (INDEPENDENT_AMBULATORY_CARE_PROVIDER_SITE_OTHER): Payer: Self-pay | Admitting: Radiology

## 2018-10-19 NOTE — Telephone Encounter (Signed)
Patient called, asked pre screening questions prior to appt scheduled for 3/30. Patient had recently been in hospital for cough/fever. Spoke with Dr Junius Roads, he recommended patient rescheduling in a week or if he needed to be seen sooner f/u with urgent care. appt rescheduled for 04/06.LF

## 2018-10-22 ENCOUNTER — Ambulatory Visit (INDEPENDENT_AMBULATORY_CARE_PROVIDER_SITE_OTHER): Payer: Self-pay | Admitting: Family Medicine

## 2018-10-25 ENCOUNTER — Telehealth (INDEPENDENT_AMBULATORY_CARE_PROVIDER_SITE_OTHER): Payer: Self-pay

## 2018-10-25 NOTE — Telephone Encounter (Signed)
Called patient and asked the screening questions.  Do you have now or have you had in the past 7 days a fever and/or chills? NO  Do you have now or have you had in the past 7 days a cough? NO  Do you have now or have you had in the last 7 days nausea, vomiting or abdominal pain? NO  Have you been exposed to anyone who has tested positive for COVID-19? NO  Have you or anyone who lives with you traveled within the last month? NO 

## 2018-10-29 ENCOUNTER — Ambulatory Visit (INDEPENDENT_AMBULATORY_CARE_PROVIDER_SITE_OTHER): Payer: 59 | Admitting: Family Medicine

## 2018-10-29 ENCOUNTER — Other Ambulatory Visit: Payer: Self-pay

## 2018-10-29 ENCOUNTER — Encounter (INDEPENDENT_AMBULATORY_CARE_PROVIDER_SITE_OTHER): Payer: Self-pay | Admitting: Family Medicine

## 2018-10-29 VITALS — BP 135/83 | HR 65 | Temp 98.7°F | Ht 69.0 in | Wt 140.0 lb

## 2018-10-29 DIAGNOSIS — I10 Essential (primary) hypertension: Secondary | ICD-10-CM

## 2018-10-29 DIAGNOSIS — Z87891 Personal history of nicotine dependence: Secondary | ICD-10-CM

## 2018-10-29 DIAGNOSIS — J40 Bronchitis, not specified as acute or chronic: Secondary | ICD-10-CM

## 2018-10-29 DIAGNOSIS — I739 Peripheral vascular disease, unspecified: Secondary | ICD-10-CM

## 2018-10-29 DIAGNOSIS — Z720 Tobacco use: Secondary | ICD-10-CM

## 2018-10-29 DIAGNOSIS — E785 Hyperlipidemia, unspecified: Secondary | ICD-10-CM

## 2018-10-29 NOTE — Progress Notes (Signed)
Office Visit Note   Patient: Jackson Mathews           Date of Birth: 05/28/1956           MRN: [id number] Visit Date: 10/29/2018 Requested by: Vivi Barrack, MD [address] [place], [place] [address] PCP: Vivi Barrack, MD  Subjective: Chief Complaint  Patient presents with  . Follow-up    recently in hospitial due to bronchitis 10/12/2018. feeling much better, no cough, breathing better, quit smoking approximately 2 weeks ago.     HPI: He is here to reestablish primary care.  On March 20 he was hospitalized with acute bronchitis related to smoking.  He was treated successfully with nebulizers, oxygen, steroids and Levaquin.  2 days prior to his admission, he had become so short of breath that he decided at that moment he was going to quit cigarettes.  He has not smoked since then.  Today he feels better than he has felt in many years.  He has not been coughing for the past week or more.  He is very pleased with his progress.  He is not needing inhalers at this point.  Several years ago he underwent right carotid endarterectomy.  He has about 60% blockage of his left, which is being monitored periodically.  Hypertension has been controlled with carvedilol.  Blood pressures have been much better since quitting smoking.  He tolerates Lipitor for hyperlipidemia.  He is also taking a baby aspirin daily for cardiovascular protection.               ROS: No fevers or chills.  All other systems were reviewed and are negative.  Objective: Vital Signs: BP 135/83 (BP Location: Left Arm, Patient Position: Sitting, Cuff Size: Normal)   Pulse 65   Temp 98.7 F (37.1 C)   Ht 5\' 9"  (1.753 m)   Wt 140 lb (63.5 kg)   BMI 20.67 kg/m   Physical Exam:  General:  Alert and oriented, in no acute distress. Pulm:  Breathing unlabored. Psy:  Normal mood, congruent affect. Skin: No rash on his skin. HEENT:  [place]/AT, PERRLA, EOM Full, no nystagmus.  Funduscopic examination within normal limits.   No conjunctival erythema.  Tympanic membranes are pearly gray with normal landmarks.  External ear canals are normal.  Nasal passages are clear.  Oropharynx is clear.  No significant lymphadenopathy.  No thyromegaly or nodules.  2+ carotid pulses without bruits. CV: Regular rate and rhythm without murmurs, rubs, or gallops.  No peripheral edema.  2+ radial and posterior tibial pulses. Lungs: Clear to auscultation throughout with no wheezing or areas of consolidation.  Breath sounds are slightly diminished diffusely.    Imaging: None today.  Assessment & Plan: 1.  Resolved bronchitis with probable underlying COPD. -Pulmonary consult was recommended at the hospital, but patient would prefer to hold off for now since he is feeling so well stopping cigarettes. -We can readdress pulmonary consult in the future if he takes a turn for the worse.  2.  Hypertension, under much better control. - Continue with medications.  Follow-up in about 6 months.  3.  Hyperlipidemia, tolerating Lipitor. - Call for refills when needed.  Recheck labs in about 6 months.  4.  Former smoker - Applauded efforts.     Procedures: No procedures performed  No notes on file     PMFS History: Patient Active Problem List   Diagnosis Date Noted  . Hyponatremia 10/14/2018  . Leukocytosis 10/14/2018  .  Hypoxia 10/12/2018  . Tobacco use 10/12/2018  . Bronchitis due to tobacco use 10/12/2018  . Lower urinary tract symptoms (LUTS) 08/18/2017  . Systolic murmur 97/98/9211  . Actinic keratosis 08/18/2017  . Essential hypertension 10/12/2015  . HLD (hyperlipidemia) 10/12/2015  . CAD in native artery-cath 09/24/15 non obstructive 09/24/2015  . Peripheral vascular disease ([place]) 09/23/2015  . Carotid artery disease ([place]) 09/23/2015  . History of right-sided carotid endarterectomy 09/23/2015  . Nicotine dependence with current use 09/23/2015  . Hx of colonic polyp - ssp 06/21/2015  . HEARING LOSS 08/20/2010  .  DYSPNEA 08/20/2010  . SKIN CANCER, HX OF 08/20/2010   Past Medical History:  Diagnosis Date  . CAD in native artery-cath 09/24/15 non obstructive 09/24/2015  . Calf pain   . Cancer (HCC)    Skin  . Carotid artery occlusion   . Chest pain   . Dyspnea   . Hearing loss   . History of right-sided carotid endarterectomy 09/23/2015  . Hx of colonic polyp - ssp 06/21/2015  . Hypercholesteremia   . Hypertension   . Lower back pain   . Palpitations   . Tinnitus   . Tobacco use 09/23/2015  . Vertigo     Family History  Problem Relation Age of Onset  . Heart disease Mother   . Cancer Father   . Heart attack Father        s/p CABG  . Diabetes Brother   . Colon cancer Neg Hx   . Esophageal cancer Neg Hx   . Ulcerative colitis Neg Hx   . Stomach cancer Neg Hx   . Rectal cancer Neg Hx     Past Surgical History:  Procedure Laterality Date  . CARDIAC CATHETERIZATION N/A 09/24/2015   Procedure: Left Heart Cath and Coronary Angiography;  Surgeon: Burnell Blanks, MD;  Location: [facility];  Service: Cardiovascular;  Laterality: N/A;  . CAROTID ENDARTERECTOMY  05/05/11   Right CEA  . MELANOMA EXCISION     l eye  . SKIN CANCER EXCISION     Social History   Occupational History  . Not on file  Tobacco Use  . Smoking status: Current Every Day Smoker    Packs/day: 1.00    Years: 30.00    Pack years: 30.00    Types: Cigarettes  . Smokeless tobacco: Never Used  . Tobacco comment: 2 ppd for 45 years (10/12/2018)  Substance and Sexual Activity  . Alcohol use: No    Alcohol/week: 0.0 standard drinks  . Drug use: No  . Sexual activity: Not on file

## 2018-10-29 NOTE — Patient Instructions (Signed)
   Vitamin D3:  2,000 IU daily Vitamin C:  1,000 mg daily Zinc:  20-30 mg daily Quercetin:  As directed on bottle NAC (N-Acetyl Cysteine):  As directed on bottle

## 2018-11-05 ENCOUNTER — Other Ambulatory Visit: Payer: Self-pay | Admitting: Family Medicine

## 2018-11-13 ENCOUNTER — Other Ambulatory Visit (INDEPENDENT_AMBULATORY_CARE_PROVIDER_SITE_OTHER): Payer: Self-pay | Admitting: Family Medicine

## 2018-12-16 ENCOUNTER — Other Ambulatory Visit (INDEPENDENT_AMBULATORY_CARE_PROVIDER_SITE_OTHER): Payer: Self-pay | Admitting: Family Medicine

## 2018-12-19 ENCOUNTER — Other Ambulatory Visit: Payer: Self-pay | Admitting: Family Medicine

## 2018-12-19 MED ORDER — ATORVASTATIN CALCIUM 40 MG PO TABS: 40.0000 mg | ORAL_TABLET | Freq: Every day | ORAL | 6 refills | Status: DC

## 2018-12-19 MED ORDER — CARVEDILOL 3.125 MG PO TABS: 3.1250 mg | ORAL_TABLET | Freq: Two times a day (BID) | ORAL | 6 refills | Status: DC

## 2019-03-07 ENCOUNTER — Other Ambulatory Visit: Payer: Self-pay

## 2019-03-07 ENCOUNTER — Ambulatory Visit (INDEPENDENT_AMBULATORY_CARE_PROVIDER_SITE_OTHER): Payer: 59 | Admitting: Family Medicine

## 2019-03-07 ENCOUNTER — Encounter: Payer: Self-pay | Admitting: Family Medicine

## 2019-03-07 DIAGNOSIS — M545 Low back pain, unspecified: Secondary | ICD-10-CM

## 2019-03-07 DIAGNOSIS — R410 Disorientation, unspecified: Secondary | ICD-10-CM

## 2019-03-07 DIAGNOSIS — M5441 Lumbago with sciatica, right side: Secondary | ICD-10-CM | POA: Diagnosis not present

## 2019-03-07 DIAGNOSIS — G8929 Other chronic pain: Secondary | ICD-10-CM

## 2019-03-07 DIAGNOSIS — B029 Zoster without complications: Secondary | ICD-10-CM | POA: Diagnosis not present

## 2019-03-07 MED ORDER — VALACYCLOVIR HCL 1 G PO TABS: 1000.0000 mg | ORAL_TABLET | Freq: Three times a day (TID) | ORAL | 1 refills | Status: DC

## 2019-03-07 MED ORDER — TRAMADOL HCL 50 MG PO TABS: 50.0000 mg | ORAL_TABLET | Freq: Four times a day (QID) | ORAL | 0 refills | Status: DC | PRN

## 2019-03-07 MED ORDER — GABAPENTIN 300 MG PO CAPS: ORAL_CAPSULE | ORAL | 1 refills | Status: DC

## 2019-03-07 NOTE — Progress Notes (Signed)
Office Visit Note   Patient: Jackson Mathews           Date of Birth: [birth year]           MRN: [id number] Visit Date: 03/07/2019 Requested by: Eunice Blase, MD [address] [place],  [place] [address] PCP: Eunice Blase, MD  Subjective: Chief Complaint  Patient presents with  . Lower Back - Pain    Pain in posterior right lower back, radiating down the lateral leg to the ankle x 2 weeks. NKI. No improvement with prednisone and muscle relaxer. Not slept well in 2 weeks - pain wakes him. Tried ice, heat & CBD.     HPI: He is here with right-sided posterior hip and leg pain.  Symptoms started about 2 weeks ago, no injury.  Pain in the posterior hip radiating down to the foot.  No fevers or chills.  He went to urgent care and was given prednisone and a muscle relaxant which did not help much.  He cannot sleep, the pain is constant.  He is tried ice and heat as well.  History of similar pain in the past the past that responded to ice and heat heat.              ROS:  all other systems were reviewed and are negative.  Objective: Vital Signs: There were no vitals taken for this visit.  Physical Exam:  General:  Alert and oriented, in no acute distress. Pulm:  Breathing unlabored. Psy:  Normal mood, congruent affect. Skin: He has a vesicular rash starting in the upper midline gluteal fold and extending down the right buttocks toward the hip. Low back: Pain with straight leg raise on the right.  Lower extremity strength and reflexes are normal.  Imaging: Lower extremity strength none today.  Assessment & Plan: 1.  Right-sided sciatica with rash consistent with shingles -Valtrex, gabapentin, tramadol as needed.  Toradol injection today.  Follow-up as needed.     Procedures: No procedures performed  No notes on file     PMFS History: Patient Active Problem List   Diagnosis Date Noted  . Hyponatremia 10/14/2018  . Leukocytosis 10/14/2018  . Hypoxia 10/12/2018  .  Tobacco use 10/12/2018  . Bronchitis due to tobacco use 10/12/2018  . Lower urinary tract symptoms (LUTS) 08/18/2017  . Systolic murmur 28/76/8115  . Actinic keratosis 08/18/2017  . Essential hypertension 10/12/2015  . HLD (hyperlipidemia) 10/12/2015  . CAD in native artery-cath 09/24/15 non obstructive 09/24/2015  . Peripheral vascular disease ([place]) 09/23/2015  . Carotid artery disease ([place]) 09/23/2015  . History of right-sided carotid endarterectomy 09/23/2015  . Nicotine dependence with current use 09/23/2015  . Hx of colonic polyp - ssp 06/21/2015  . HEARING LOSS 08/20/2010  . DYSPNEA 08/20/2010  . SKIN CANCER, HX OF 08/20/2010   Past Medical History:  Diagnosis Date  . CAD in native artery-cath 09/24/15 non obstructive 09/24/2015  . Calf pain   . Cancer (HCC)    Skin  . Carotid artery occlusion   . Chest pain   . Dyspnea   . Hearing loss   . History of right-sided carotid endarterectomy 09/23/2015  . Hx of colonic polyp - ssp 06/21/2015  . Hypercholesteremia   . Hypertension   . Lower back pain   . Palpitations   . Tinnitus   . Tobacco use 09/23/2015  . Vertigo     Family History  Problem Relation Age of Onset  . Heart disease Mother   .  Cancer Father   . Heart attack Father        s/p CABG  . Diabetes Brother   . Colon cancer Neg Hx   . Esophageal cancer Neg Hx   . Ulcerative colitis Neg Hx   . Stomach cancer Neg Hx   . Rectal cancer Neg Hx     Past Surgical History:  Procedure Laterality Date  . CARDIAC CATHETERIZATION N/A 09/24/2015   Procedure: Left Heart Cath and Coronary Angiography;  Surgeon: Burnell Blanks, MD;  Location: [facility];  Service: Cardiovascular;  Laterality: N/A;  . CAROTID ENDARTERECTOMY  05/05/11   Right CEA  . MELANOMA EXCISION     l eye  . SKIN CANCER EXCISION     Social History   Occupational History  . Not on file  Tobacco Use  . Smoking status: Current Every Day Smoker    Packs/day: 1.00    Years: 30.00    Pack  years: 30.00    Types: Cigarettes  . Smokeless tobacco: Never Used  . Tobacco comment: 2 ppd for 45 years (10/12/2018)  Substance and Sexual Activity  . Alcohol use: No    Alcohol/week: 0.0 standard drinks  . Drug use: No  . Sexual activity: Not on file

## 2019-03-07 NOTE — Progress Notes (Signed)
Ketorolac (generic toradol) 60mg  (2cc) given IM right dorsogluteal region.

## 2019-03-08 ENCOUNTER — Telehealth: Payer: Self-pay | Admitting: Family Medicine

## 2019-03-08 NOTE — Telephone Encounter (Signed)
Ok to be around others except pregnant women and those who have never had chickenpox.  Once the sores are crusted over, they shouldn't be contagious.  Nerve pain can last several weeks sometimes.  No need to be out of work unless in too much pain.

## 2019-03-08 NOTE — Telephone Encounter (Signed)
Patient called asked does he need to be out of work? Patient asked should he not be around other people? Patient asked how long does shingles last? The number to contact patient is [phone]

## 2019-03-08 NOTE — Telephone Encounter (Signed)
I called and spoke with the patient advising Jackson Mathews of Dr. Junius Roads' response. He was just worrying about whether or not he would be ok to return to work on Monday. He will let us know how he is doing and if he needs a note to be out of work.

## 2019-03-08 NOTE — Telephone Encounter (Signed)
Please advise 

## 2019-04-30 ENCOUNTER — Other Ambulatory Visit: Payer: Self-pay

## 2019-04-30 ENCOUNTER — Encounter: Payer: Self-pay | Admitting: Family Medicine

## 2019-04-30 ENCOUNTER — Ambulatory Visit (INDEPENDENT_AMBULATORY_CARE_PROVIDER_SITE_OTHER): Payer: 59 | Admitting: Family Medicine

## 2019-04-30 VITALS — BP 146/77 | HR 82

## 2019-04-30 DIAGNOSIS — I10 Essential (primary) hypertension: Secondary | ICD-10-CM

## 2019-04-30 DIAGNOSIS — F17200 Nicotine dependence, unspecified, uncomplicated: Secondary | ICD-10-CM

## 2019-04-30 DIAGNOSIS — R0989 Other specified symptoms and signs involving the circulatory and respiratory systems: Secondary | ICD-10-CM | POA: Diagnosis not present

## 2019-04-30 MED ORDER — TAMSULOSIN HCL 0.4 MG PO CAPS: 0.4000 mg | ORAL_CAPSULE | Freq: Every day | ORAL | 3 refills | Status: DC

## 2019-04-30 MED ORDER — NICOTINE 21 MG/24HR TD PT24: 21.0000 mg | MEDICATED_PATCH | Freq: Every day | TRANSDERMAL | 0 refills | Status: DC

## 2019-04-30 MED ORDER — SILDENAFIL CITRATE 50 MG PO TABS: 50.0000 mg | ORAL_TABLET | Freq: Every day | ORAL | 6 refills | Status: DC | PRN

## 2019-04-30 NOTE — Progress Notes (Signed)
Office Visit Note   Patient: Jackson Mathews           Date of Birth: [birth year]           MRN: [id number] Visit Date: 04/30/2019 Requested by: Vivi Barrack, [place],  [place] [address] PCP: Eunice Blase, MD  Subjective: Chief Complaint  Patient presents with  . 6 mos f/u - hypertension/hyperlipidemia/smoking cessation  . med refill (Flomax)    HPI: He is here for follow-up hypertension.  Since last visit he has resumed smoking.  He is frustrated that he did this, and plans to quit starting tomorrow.  He would like a prescription for NicoDerm patches which have worked for him in the past.  He no longer has pain from his shingles outbreak.  He needs a refill of Flomax for BPH.                ROS:   All other systems were reviewed and are negative.  Objective: Vital Signs: BP (!) 146/77 (BP Location: Left Arm, Patient Position: Sitting, Cuff Size: Normal)   Pulse 82   Physical Exam:  General:  Alert and oriented, in no acute distress. Pulm:  Breathing unlabored. Psy:  Normal mood, congruent affect.  Neck: He has a left carotid bruit. CV: Regular rate and rhythm without murmurs, rubs, or gallops.  No peripheral edema.  2+ radial and posterior tibial pulses. Lungs: Clear to auscultation throughout with no wheezing or areas of consolidation.    Imaging: none  Assessment & Plan: 1.  Hypertension, suboptimally controlled today but could be related to recent resumption of smoking -No changes made other than work on smoking cessation.  Follow-up in about 6 months.  Labs at that time.  2.  Left carotid bruit -He has a history of moderate stenosis but his last ultrasound was 2017.  We will order a new one.  3.  BPH -Refill Flomax     Procedures: No procedures performed  No notes on file     PMFS History: Patient Active Problem List   Diagnosis Date Noted  . Hyponatremia 10/14/2018  . Leukocytosis 10/14/2018  . Hypoxia 10/12/2018  .  Tobacco use 10/12/2018  . Bronchitis due to tobacco use 10/12/2018  . Lower urinary tract symptoms (LUTS) 08/18/2017  . Systolic murmur [id number]  . Actinic keratosis 08/18/2017  . Essential hypertension 10/12/2015  . HLD (hyperlipidemia) 10/12/2015  . CAD in native artery-cath 09/24/15 non obstructive 09/24/2015  . Peripheral vascular disease ([place]) 09/23/2015  . Carotid artery disease ([place]) 09/23/2015  . History of right-sided carotid endarterectomy 09/23/2015  . Nicotine dependence with current use 09/23/2015  . Hx of colonic polyp - ssp 06/21/2015  . HEARING LOSS 08/20/2010  . DYSPNEA 08/20/2010  . SKIN CANCER, HX OF 08/20/2010   Past Medical History:  Diagnosis Date  . CAD in native artery-cath 09/24/15 non obstructive 09/24/2015  . Calf pain   . Cancer (HCC)    Skin  . Carotid artery occlusion   . Chest pain   . Dyspnea   . Hearing loss   . History of right-sided carotid endarterectomy 09/23/2015  . Hx of colonic polyp - ssp 06/21/2015  . Hypercholesteremia   . Hypertension   . Lower back pain   . Palpitations   . Tinnitus   . Tobacco use 09/23/2015  . Vertigo     Family History  Problem Relation Age of Onset  . Heart disease Mother   . Cancer Father   .  Heart attack Father        s/p CABG  . Diabetes Brother   . Colon cancer Neg Hx   . Esophageal cancer Neg Hx   . Ulcerative colitis Neg Hx   . Stomach cancer Neg Hx   . Rectal cancer Neg Hx     Past Surgical History:  Procedure Laterality Date  . CARDIAC CATHETERIZATION N/A 09/24/2015   Procedure: Left Heart Cath and Coronary Angiography;  Surgeon: Burnell Blanks, MD;  Location: [facility];  Service: Cardiovascular;  Laterality: N/A;  . CAROTID ENDARTERECTOMY  05/05/11   Right CEA  . MELANOMA EXCISION     l eye  . SKIN CANCER EXCISION     Social History   Occupational History  . Not on file  Tobacco Use  . Smoking status: Current Every Day Smoker    Packs/day: 1.00    Years: 30.00    Pack  years: 30.00    Types: Cigarettes  . Smokeless tobacco: Never Used  . Tobacco comment: 2 ppd for 45 years (10/12/2018)  Substance and Sexual Activity  . Alcohol use: No    Alcohol/week: 0.0 standard drinks  . Drug use: No  . Sexual activity: Not on file

## 2019-07-18 ENCOUNTER — Other Ambulatory Visit: Payer: Self-pay | Admitting: Family Medicine

## 2019-09-11 ENCOUNTER — Encounter: Payer: Self-pay | Admitting: Family Medicine

## 2019-09-11 ENCOUNTER — Ambulatory Visit: Payer: 59 | Admitting: Family Medicine

## 2019-09-11 ENCOUNTER — Other Ambulatory Visit: Payer: Self-pay

## 2019-09-11 VITALS — BP 136/78 | HR 91

## 2019-09-11 DIAGNOSIS — R200 Anesthesia of skin: Secondary | ICD-10-CM

## 2019-09-11 MED ORDER — ALBUTEROL SULFATE HFA 108 (90 BASE) MCG/ACT IN AERS: 2.0000 | INHALATION_SPRAY | Freq: Four times a day (QID) | RESPIRATORY_TRACT | 6 refills | Status: DC | PRN

## 2019-09-11 NOTE — Progress Notes (Signed)
Office Visit Note   Patient: Jackson Mathews           Date of Birth: July 11, 1956           MRN: [id number] Visit Date: 09/11/2019 Requested by: Eunice Blase, MD [address] [place],  [place] [address] PCP: Eunice Blase, MD  Subjective: Chief Complaint  Patient presents with  . numbness left side of body last night  . requests Rx for a rescue inhaler to carry at work    HPI: He is here with concerns about left side numbness.  This past Saturday he was called into work, when he got out of his chair he felt like his leg was completely numb on the left side.  He was able to walk, but it was difficult to move it for a little while.  Little while later everything felt normal again and he was able to work without difficulty and felt fine the rest of the weekend.  Then last night after he came home from work, he was going to visit his mother and he felt numbness in his left arm and leg.  Once again he was able to move both extremities.  He had no speech difficulty, no trouble swallowing.  He drove himself to his mother's house and by the time he got there everything seemed to be normal again.  He not denies any chest pain, palpitations, headache.  He did have a slight right eye visual disturbance last night which resolved.  Today he feels normal.  He is status post right-sided carotid endarterectomy.  We had scheduled a left-sided Doppler but it was never done.               ROS:   All other systems were reviewed and are negative.  Objective: Vital Signs: BP 136/78   Pulse 91   Physical Exam:  General:  Alert and oriented, in no acute distress. Pulm:  Breathing unlabored. Psy:  Normal mood, congruent affect.  HEENT:  [place]/AT, PERRLA, EOM Full, no nystagmus.  Funduscopic examination within normal limits.  No conjunctival erythema.  Tympanic membranes are pearly gray with normal landmarks.  External ear canals are normal.  Nasal passages are clear.  Oropharynx is clear.  No significant  lymphadenopathy.  No thyromegaly or nodules.  2+ carotid pulses with a left-sided bruit.  Cranial nerves II through XII are intact. CV: Regular rate and rhythm without murmurs, rubs, or gallops.  No peripheral edema.  2+ radial and posterior tibial pulses. Lungs: Clear to auscultation throughout with no wheezing or areas of consolidation. Neuro: Upper and lower extremity strength and reflexes are normal today.    Imaging: None today  Assessment & Plan: 1.  Resolved left arm and leg numbness, etiology uncertain but at risk for stroke. -We will order carotid Dopplers and head CT scan. -He will start taking baby aspirin again. -Emphasized the importance of calling 911 if this happens again, do not try to drive to the ER.     Procedures: No procedures performed  No notes on file     PMFS History: Patient Active Problem List   Diagnosis Date Noted  . Hyponatremia 10/14/2018  . Leukocytosis 10/14/2018  . Hypoxia 10/12/2018  . Tobacco use 10/12/2018  . Bronchitis due to tobacco use 10/12/2018  . Lower urinary tract symptoms (LUTS) 08/18/2017  . Systolic murmur [id number]  . Actinic keratosis 08/18/2017  . Essential hypertension 10/12/2015  . HLD (hyperlipidemia) 10/12/2015  . CAD in native artery-cath 09/24/15  non obstructive 09/24/2015  . Peripheral vascular disease ([place]) 09/23/2015  . Carotid artery disease ([place]) 09/23/2015  . History of right-sided carotid endarterectomy 09/23/2015  . Nicotine dependence with current use 09/23/2015  . Hx of colonic polyp - ssp 06/21/2015  . HEARING LOSS 08/20/2010  . DYSPNEA 08/20/2010  . SKIN CANCER, HX OF 08/20/2010   Past Medical History:  Diagnosis Date  . CAD in native artery-cath 09/24/15 non obstructive 09/24/2015  . Calf pain   . Cancer (HCC)    Skin  . Carotid artery occlusion   . Chest pain   . Dyspnea   . Hearing loss   . History of right-sided carotid endarterectomy 09/23/2015  . Hx of colonic polyp - ssp 06/21/2015  .  Hypercholesteremia   . Hypertension   . Lower back pain   . Palpitations   . Tinnitus   . Tobacco use 09/23/2015  . Vertigo     Family History  Problem Relation Age of Onset  . Heart disease Mother   . Cancer Father   . Heart attack Father        s/p CABG  . Diabetes Brother   . Colon cancer Neg Hx   . Esophageal cancer Neg Hx   . Ulcerative colitis Neg Hx   . Stomach cancer Neg Hx   . Rectal cancer Neg Hx     Past Surgical History:  Procedure Laterality Date  . CARDIAC CATHETERIZATION N/A 09/24/2015   Procedure: Left Heart Cath and Coronary Angiography;  Surgeon: Burnell Blanks, MD;  Location: [facility];  Service: Cardiovascular;  Laterality: N/A;  . CAROTID ENDARTERECTOMY  05/05/11   Right CEA  . MELANOMA EXCISION     l eye  . SKIN CANCER EXCISION     Social History   Occupational History  . Not on file  Tobacco Use  . Smoking status: Current Every Day Smoker    Packs/day: 1.00    Years: 30.00    Pack years: 30.00    Types: Cigarettes  . Smokeless tobacco: Never Used  . Tobacco comment: 2 ppd for 45 years (10/12/2018)  Substance and Sexual Activity  . Alcohol use: No    Alcohol/week: 0.0 standard drinks  . Drug use: No  . Sexual activity: Not on file

## 2019-09-20 ENCOUNTER — Ambulatory Visit
Admission: RE | Admit: 2019-09-20 | Discharge: 2019-09-20 | Disposition: A | Discharge status: Home / Self Care | Payer: 59 | Source: Ambulatory Visit | Attending: Family Medicine | Admitting: Family Medicine

## 2019-09-20 DIAGNOSIS — R200 Anesthesia of skin: Secondary | ICD-10-CM

## 2019-09-21 ENCOUNTER — Telehealth: Payer: Self-pay | Admitting: Family Medicine

## 2019-09-21 DIAGNOSIS — R200 Anesthesia of skin: Secondary | ICD-10-CM

## 2019-09-21 DIAGNOSIS — R0989 Other specified symptoms and signs involving the circulatory and respiratory systems: Secondary | ICD-10-CM

## 2019-09-21 NOTE — Telephone Encounter (Signed)
Head CT was normal.

## 2019-09-23 NOTE — Telephone Encounter (Signed)
Yes, test ordered.

## 2019-09-23 NOTE — Telephone Encounter (Signed)
I called and advised the order has been placed

## 2019-09-23 NOTE — Telephone Encounter (Signed)
Patient is asking if you were going to order a U/S of his carotid artery?

## 2019-09-23 NOTE — Addendum Note (Signed)
Addended by: Hortencia Pilar on: 09/23/2019 09:26 AM   Modules accepted: Orders

## 2019-10-11 ENCOUNTER — Telehealth: Payer: Self-pay | Admitting: Family Medicine

## 2019-10-11 NOTE — Telephone Encounter (Signed)
Patient called and stated he needs to f/u on a procedure that was to be a referral.  Has not heard anything from him  Please call patient [phone]

## 2019-10-14 NOTE — Telephone Encounter (Signed)
Pt is scheduled for Friday Mar 26th at 9am with 845am arrival. Pt is to enter at Entrance [facility] of [facility]. Pt is aware of appt

## 2019-10-14 NOTE — Telephone Encounter (Signed)
Can you please check on this for the patient? Dr. Junius Roads had ordered bilateral carotid ultrasound.

## 2019-10-18 ENCOUNTER — Telehealth: Payer: Self-pay | Admitting: Family Medicine

## 2019-10-18 ENCOUNTER — Ambulatory Visit (HOSPITAL_COMMUNITY)
Admission: RE | Admit: 2019-10-18 | Discharge: 2019-10-18 | Disposition: A | Discharge status: Home / Self Care | Payer: 59 | Source: Ambulatory Visit | Attending: Family Medicine | Admitting: Family Medicine

## 2019-10-18 ENCOUNTER — Other Ambulatory Visit: Payer: Self-pay

## 2019-10-18 DIAGNOSIS — R200 Anesthesia of skin: Secondary | ICD-10-CM | POA: Diagnosis present

## 2019-10-18 NOTE — Telephone Encounter (Signed)
I spoke with pt and advised. He verbalized understanding.

## 2019-10-18 NOTE — Telephone Encounter (Signed)
Notify pt:  Right carotid artery does not have significant blockage.  Left carotid has mild (40-59%) blockage.    Would recheck in about a year.

## 2019-10-18 NOTE — Progress Notes (Signed)
Carotid duplex has been completed.   Preliminary results in CV Proc.   Abram Sander 10/18/2019 9:13 AM

## 2020-02-20 ENCOUNTER — Ambulatory Visit: Payer: 59 | Admitting: Family Medicine

## 2020-02-20 ENCOUNTER — Other Ambulatory Visit: Payer: Self-pay

## 2020-02-20 ENCOUNTER — Encounter: Payer: Self-pay | Admitting: Family Medicine

## 2020-02-20 DIAGNOSIS — M542 Cervicalgia: Secondary | ICD-10-CM

## 2020-02-20 MED ORDER — BACLOFEN 10 MG PO TABS: 5.0000 mg | ORAL_TABLET | Freq: Three times a day (TID) | ORAL | 3 refills | Status: DC | PRN

## 2020-02-20 NOTE — Progress Notes (Signed)
Office Visit Note   Patient: Jackson Mathews           Date of Birth: 06/16/1956           MRN: [id number] Visit Date: 02/20/2020 Requested by: Eunice Blase, MD [address] [place],  [place] [address] PCP: Eunice Blase, MD  Subjective: Chief Complaint  Patient presents with  . Neck - Pain    Stiffness/pain in the neck with trying to turn his head side-to-side since 2 days ago. He had a skin cancer removed from right side of neck a week ago. Used alternating heat/ice last night - feeling much better today.    HPI: He is here with right-sided neck pain.  Symptoms started 2 days ago, no injury.  He had a skin cancer removed from the posterior neck about a week ago.  He thinks he might have been protecting that area and sleeping differently.  He woke up with pain and started using ice and heat.  He made this appointment and since then, he has been feeling much better.  No radicular symptoms.              ROS:   All other systems were reviewed and are negative.  Objective: Vital Signs: There were no vitals taken for this visit.  Physical Exam:  General:  Alert and oriented, in no acute distress. Pulm:  Breathing unlabored. Psy:  Normal mood, congruent affect. Skin: His skin procedure wound is healing well.  No sign of cellulitis. Neck: Full range of motion, upper extremity strength and reflexes are normal.  He still has some residual tenderness in the right-sided paraspinous muscles with a tender trigger point.  Imaging: No results found.  Assessment & Plan: 1.  Improving myofascial neck pain -He will keep using ice and heat, deep tissue massage.  Baclofen if symptoms worsen.  Follow-up as needed.     Procedures: No procedures performed  No notes on file     PMFS History: Patient Active Problem List   Diagnosis Date Noted  . Hyponatremia 10/14/2018  . Leukocytosis 10/14/2018  . Hypoxia 10/12/2018  . Tobacco use 10/12/2018  . Bronchitis due to tobacco use  10/12/2018  . Lower urinary tract symptoms (LUTS) 08/18/2017  . Systolic murmur 46/27/0350  . Actinic keratosis 08/18/2017  . Essential hypertension 10/12/2015  . HLD (hyperlipidemia) 10/12/2015  . CAD in native artery-cath 09/24/15 non obstructive 09/24/2015  . Peripheral vascular disease ([place]) 09/23/2015  . Carotid artery disease ([place]) 09/23/2015  . History of right-sided carotid endarterectomy 09/23/2015  . Nicotine dependence with current use 09/23/2015  . Hx of colonic polyp - ssp 06/21/2015  . HEARING LOSS 08/20/2010  . DYSPNEA 08/20/2010  . SKIN CANCER, HX OF 08/20/2010   Past Medical History:  Diagnosis Date  . CAD in native artery-cath 09/24/15 non obstructive 09/24/2015  . Calf pain   . Cancer (HCC)    Skin  . Carotid artery occlusion   . Chest pain   . Dyspnea   . Hearing loss   . History of right-sided carotid endarterectomy 09/23/2015  . Hx of colonic polyp - ssp 06/21/2015  . Hypercholesteremia   . Hypertension   . Lower back pain   . Palpitations   . Tinnitus   . Tobacco use 09/23/2015  . Vertigo     Family History  Problem Relation Age of Onset  . Heart disease Mother   . Cancer Father   . Heart attack Father  s/p CABG  . Diabetes Brother   . Colon cancer Neg Hx   . Esophageal cancer Neg Hx   . Ulcerative colitis Neg Hx   . Stomach cancer Neg Hx   . Rectal cancer Neg Hx     Past Surgical History:  Procedure Laterality Date  . CARDIAC CATHETERIZATION N/A 09/24/2015   Procedure: Left Heart Cath and Coronary Angiography;  Surgeon: Burnell Blanks, MD;  Location: [facility];  Service: Cardiovascular;  Laterality: N/A;  . CAROTID ENDARTERECTOMY  05/05/11   Right CEA  . MELANOMA EXCISION     l eye  . SKIN CANCER EXCISION     Social History   Occupational History  . Not on file  Tobacco Use  . Smoking status: Current Every Day Smoker    Packs/day: 1.00    Years: 30.00    Pack years: 30.00    Types: Cigarettes  . Smokeless tobacco:  Never Used  . Tobacco comment: 2 ppd for 45 years (10/12/2018)  Substance and Sexual Activity  . Alcohol use: No    Alcohol/week: 0.0 standard drinks  . Drug use: No  . Sexual activity: Not on file

## 2020-03-24 ENCOUNTER — Other Ambulatory Visit: Payer: Self-pay | Admitting: Family Medicine

## 2020-05-11 ENCOUNTER — Other Ambulatory Visit: Payer: Self-pay | Admitting: Family Medicine

## 2020-05-11 MED ORDER — SILDENAFIL CITRATE 50 MG PO TABS: 50.0000 mg | ORAL_TABLET | Freq: Every day | ORAL | 6 refills | Status: DC | PRN

## 2020-11-15 ENCOUNTER — Encounter: Payer: Self-pay | Admitting: Internal Medicine

## 2020-12-22 ENCOUNTER — Other Ambulatory Visit: Payer: Self-pay | Admitting: Family Medicine

## 2021-06-08 ENCOUNTER — Telehealth: Payer: Self-pay | Admitting: Family Medicine

## 2021-06-08 NOTE — Telephone Encounter (Signed)
Medical records emailed to [facility].

## 2021-06-11 ENCOUNTER — Ambulatory Visit (INDEPENDENT_AMBULATORY_CARE_PROVIDER_SITE_OTHER): Payer: 59 | Admitting: Vascular Surgery

## 2021-06-11 ENCOUNTER — Other Ambulatory Visit: Payer: Self-pay

## 2021-06-11 ENCOUNTER — Ambulatory Visit (HOSPITAL_COMMUNITY)
Admission: RE | Admit: 2021-06-11 | Discharge: 2021-06-11 | Disposition: A | Discharge status: Home / Self Care | Payer: 59 | Source: Ambulatory Visit | Attending: Family Medicine | Admitting: Family Medicine

## 2021-06-11 ENCOUNTER — Other Ambulatory Visit (HOSPITAL_COMMUNITY): Payer: Self-pay | Admitting: Family Medicine

## 2021-06-11 ENCOUNTER — Encounter: Payer: Self-pay | Admitting: Vascular Surgery

## 2021-06-11 VITALS — BP 167/80 | HR 74 | Temp 98.1°F | Resp 20 | Ht 69.0 in | Wt 132.0 lb

## 2021-06-11 DIAGNOSIS — I6521 Occlusion and stenosis of right carotid artery: Secondary | ICD-10-CM

## 2021-06-11 DIAGNOSIS — I6529 Occlusion and stenosis of unspecified carotid artery: Secondary | ICD-10-CM

## 2021-06-11 DIAGNOSIS — R0989 Other specified symptoms and signs involving the circulatory and respiratory systems: Secondary | ICD-10-CM | POA: Insufficient documentation

## 2021-06-11 DIAGNOSIS — R200 Anesthesia of skin: Secondary | ICD-10-CM

## 2021-06-11 MED ORDER — ATORVASTATIN CALCIUM 40 MG PO TABS: 40.0000 mg | ORAL_TABLET | Freq: Every day | ORAL | 6 refills | Status: DC

## 2021-06-11 MED ORDER — CLOPIDOGREL BISULFATE 75 MG PO TABS: 75.0000 mg | ORAL_TABLET | Freq: Every day | ORAL | 6 refills | Status: DC

## 2021-06-11 NOTE — Progress Notes (Signed)
Office Note     CC: Concern for symptomatic ICA restenosis Requesting Provider:  Eunice Blase, MD  HPI: Jackson Mathews is a 65 y.o. (1955-09-13) male presenting at the request of .Hilts, Michael, MD. surgical history includes right carotid endarterectomy in 2012 with Dr. Trula Slade.  After his postoperative visit, Jackson Mathews was lost to follow-up.    The patient initially presented for carotid duplex ultrasonography after having an episode of blurry vision in his right eye.  Duplex ultrasonography demonstrated greater than 80% stenosis in the right internal carotid artery.  Ultrasonographer was concerned that the described event was a TIA, and therefore I saw the patient in clinic prior to him leaving the office.  On exam, Jackson Mathews was doing well.  He continues to work full-time for [employer]  He smokes 2 packs/day.  The episode described above has happened several times, and last occurred when driving.  He describes a dense fog in the right eye.  No complete vision loss, no bright light associated.  After several minutes, the dense fog lifts and his vision returns to normal.  This is never occurred with the left eye.  He denies any history of TIA, stroke, sensorimotor deficits, word finding difficulties.  The pt is not on a statin for cholesterol management.  The pt recently restarted a daily aspirin.   Other AC:  - The pt is  on medication for hypertension.   The pt is not diabetic.  Tobacco hx:  current every day  Past Medical History:  Diagnosis Date   CAD in native artery-cath 09/24/15 non obstructive 09/24/2015   Calf pain    Cancer (HCC)    Skin   Carotid artery occlusion    Chest pain    Dyspnea    Hearing loss    History of right-sided carotid endarterectomy 09/23/2015   Hx of colonic polyp - ssp 06/21/2015   Hypercholesteremia    Hypertension    Lower back pain    Palpitations    Tinnitus    Tobacco use 09/23/2015   Vertigo     Past Surgical History:  Procedure  Laterality Date   CARDIAC CATHETERIZATION N/A 09/24/2015   Procedure: Left Heart Cath and Coronary Angiography;  Surgeon: Burnell Blanks, MD;  Location: [facility];  Service: Cardiovascular;  Laterality: N/A;   CAROTID ENDARTERECTOMY  05/05/11   Right CEA   MELANOMA EXCISION     l eye   SKIN CANCER EXCISION      Social History   Socioeconomic History   Marital status: Single    Spouse name: Not on file   Number of children: Not on file   Years of education: Not on file   Highest education level: Not on file  Occupational History   Not on file  Tobacco Use   Smoking status: Every Day    Packs/day: 2.00    Years: 30.00    Pack years: 60.00    Types: Cigarettes   Smokeless tobacco: Never   Tobacco comments:    2 ppd for 45 years (10/12/2018)  Vaping Use   Vaping Use: Never used  Substance and Sexual Activity   Alcohol use: No    Alcohol/week: 0.0 standard drinks   Drug use: No   Sexual activity: Not on file  Other Topics Concern   Not on file  Social History Narrative   Not on file   Social Determinants of Health   Financial Resource Strain: Not on file  Food Insecurity:  Not on file  Transportation Needs: Not on file  Physical Activity: Not on file  Stress: Not on file  Social Connections: Not on file  Intimate Partner Violence: Not on file   Family History  Problem Relation Age of Onset   Heart disease Mother    Cancer Father    Heart attack Father        s/p CABG   Diabetes Brother    Colon cancer Neg Hx    Esophageal cancer Neg Hx    Ulcerative colitis Neg Hx    Stomach cancer Neg Hx    Rectal cancer Neg Hx     Current Outpatient Medications  Medication Sig Dispense Refill   albuterol (VENTOLIN HFA) 108 (90 Base) MCG/ACT inhaler INHALE 2 PUFFS BY MOUTH EVERY 6 HOURS INTO THE LUNGS AS NEEDED FOR WHEEZING OR SHORTNESS OF BREATH 8.5 g 11   baclofen (LIORESAL) 10 MG tablet TAKE 1/2 TO 1 TABLET(5 TO 10 MG) BY MOUTH THREE TIMES DAILY AS  NEEDED FOR MUSCLE SPASMS 30 tablet 6   carvedilol (COREG) 3.125 MG tablet TAKE 1 TABLET(3.125 MG) BY MOUTH TWICE DAILY WITH A MEAL 60 tablet 6   ipratropium-albuterol (DUONEB) 0.5-2.5 (3) MG/3ML SOLN Take 3 mLs by nebulization every 6 (six) hours as needed (Shortness of breath or wheezing). 360 mL 0   nicotine (NICODERM CQ - DOSED IN MG/24 HOURS) 21 mg/24hr patch Place 1 patch (21 mg total) onto the skin daily. 28 patch 0   Respiratory Therapy Supplies (NEBULIZER) DEVI 1 Device by Does not apply route as needed. 1 each 0   sildenafil (VIAGRA) 50 MG tablet Take 1 tablet (50 mg total) by mouth daily as needed for erectile dysfunction. 30 tablet 6   tamsulosin (FLOMAX) 0.4 MG CAPS capsule Take 1 capsule (0.4 mg total) by mouth daily. 90 capsule 3   nicotine (NICODERM CQ) 21 mg/24hr patch Place 1 patch (21 mg total) onto the skin daily. (Patient not taking: Reported on 06/11/2021) 28 patch 0   No current facility-administered medications for this visit.    Allergies  Allergen Reactions   Codeine Nausea Only    Nausea      REVIEW OF SYSTEMS:   [X]  denotes positive finding, [ ]  denotes negative finding Cardiac  Comments:  Chest pain or chest pressure:    Shortness of breath upon exertion:    Short of breath when lying flat:    Irregular heart rhythm:        Vascular    Pain in calf, thigh, or hip brought on by ambulation:    Pain in feet at night that wakes you up from your sleep:     Blood clot in your veins:    Leg swelling:         Pulmonary    Oxygen at home:    Productive cough:     Wheezing:         Neurologic    Sudden weakness in arms or legs:     Sudden numbness in arms or legs:     Sudden onset of difficulty speaking or slurred speech:    Temporary loss of vision in one eye:     Problems with dizziness:         Gastrointestinal    Blood in stool:     Vomited blood:         Genitourinary    Burning when urinating:     Blood in urine:  Psychiatric     Major depression:         Hematologic    Bleeding problems:    Problems with blood clotting too easily:        Skin    Rashes or ulcers:        Constitutional    Fever or chills:      PHYSICAL EXAMINATION:  Vitals:   06/11/21 1436 06/11/21 1437  BP: (!) 169/91 (!) 167/80  Pulse: 74   Resp: 20   Temp: 98.1 F (36.7 C)   SpO2: 95%   Weight: 132 lb (59.9 kg)   Height: 5\' 9"  (1.753 m)     General:  WDWN in NAD; vital signs documented above Gait: Not observed HENT: WNL, normocephalic Pulmonary: normal non-labored breathing , without Rales, rhonchi,  wheezing Cardiac: regular HR,  Abdomen: soft, NT, no masses Skin: without rashes Vascular Exam/Pulses:  Right Left  Radial 2+ (normal) 2+ (normal)  Ulnar 2+ (normal) 2+ (normal)                   Extremities: without ischemic changes, without Gangrene , without cellulitis; without open wounds;  Musculoskeletal: no muscle wasting or atrophy  Neurologic: A&O X 3;  No focal weakness or paresthesias are detected Psychiatric:  The pt has Normal affect.   Non-Invasive Vascular Imaging:   Right Carotid Findings:  +----------+--------+--------+--------+------------------+-----------------  ----+            PSV cm/sEDV cm/sStenosisPlaque DescriptionComments                +----------+--------+--------+--------+------------------+-----------------  ----+  CCA Prox  70      16                                                        +----------+--------+--------+--------+------------------+-----------------  ----+  CCA Mid   54      15              heterogenous                              +----------+--------+--------+--------+------------------+-----------------  ----+  CCA Distal74      17              heterogenous                              +----------+--------+--------+--------+------------------+-----------------  ----+  ICA Prox  601     313     80-99%  hypoechoic         stenosis                                                                    approximately  2.6 cm  in length                +----------+--------+--------+--------+------------------+-----------------    ASSESSMENT/PLAN: TABOR DENHAM is a 65 y.o. male presenting with right ICA restenosis.  The vision changes described are concerning for amaurosis fugax.  Carotid duplex ultrasonography demonstrates greater than 80% stenosis of the right proximal internal carotid artery. No neurological sequela were appreciated in clinic.   I would classify Favila carotid disease as symptomatic right internal carotid restenosis. He was immediately started on dual antiplatelet therapy, high intensity statin. My office has also scheduled him to have a CT scan immediately in an effort to assess the carotid artery lesion for preoperative planning. I will give him a call on Tuesday with the results of his CT scan and we will work to schedule his surgery as soon as possible.  Current surgical options include redo carotid arterectomy versus TCAR.  This decision will be made once the CT angiogram results.  Jackson Mathews was educated on the signs and symptoms of TIA, stroke, amaurosis.  He was asked to seek immediate medical attention should any of these occur.  I also talked to follow-up about smoking cessation, as this is the likely driver for his restenosis.   Broadus John, MD [facility] [phone]

## 2021-06-12 MED ORDER — ASPIRIN EC 81 MG PO TBEC: 81.0000 mg | DELAYED_RELEASE_TABLET | Freq: Every day | ORAL | 11 refills | Status: AC

## 2021-06-14 ENCOUNTER — Ambulatory Visit (HOSPITAL_COMMUNITY)
Admission: RE | Admit: 2021-06-14 | Discharge: 2021-06-14 | Disposition: A | Discharge status: Home / Self Care | Payer: 59 | Source: Ambulatory Visit | Attending: Vascular Surgery | Admitting: Vascular Surgery

## 2021-06-14 ENCOUNTER — Encounter (HOSPITAL_COMMUNITY): Payer: Self-pay

## 2021-06-14 ENCOUNTER — Other Ambulatory Visit: Payer: Self-pay

## 2021-06-14 DIAGNOSIS — R200 Anesthesia of skin: Secondary | ICD-10-CM | POA: Diagnosis present

## 2021-06-14 DIAGNOSIS — I6529 Occlusion and stenosis of unspecified carotid artery: Secondary | ICD-10-CM | POA: Insufficient documentation

## 2021-06-14 DIAGNOSIS — I6521 Occlusion and stenosis of right carotid artery: Secondary | ICD-10-CM

## 2021-06-14 LAB — POCT I-STAT CREATININE: Creatinine, Ser: 0.9 mg/dL (ref 0.61–1.24)

## 2021-06-14 MED ORDER — IOHEXOL 350 MG/ML SOLN
75.0000 mL | Freq: Once | INTRAVENOUS | Status: AC | PRN
  Administered 2021-06-14: 75 mL via INTRAVENOUS

## 2021-06-15 ENCOUNTER — Ambulatory Visit: Payer: 59 | Admitting: Vascular Surgery

## 2021-06-15 ENCOUNTER — Other Ambulatory Visit: Payer: Self-pay

## 2021-06-24 NOTE — Progress Notes (Signed)
Surgical Instructions    Your procedure is scheduled on 06/28/21.  Report to [facility] Main Entrance "A" at 5:30 A.M., then check in with the Admitting office.  Call this number if you have problems the morning of surgery:  [phone]   If you have any questions prior to your surgery date call [phone]: Open Monday-Friday 8am-4pm    Remember:  Do not eat or drink after midnight the night before your surgery      Take these medicines the morning of surgery with A SIP OF WATER  amLODipine (NORVASC) atorvastatin (LIPITOR) clopidogrel (PLAVIX)  aspirin EC  IF NEEDED: albuterol (VENTOLIN HFA) 108 (90 Base) inhaler if needed (bring with you the day of surgery) ipratropium-albuterol (DUONEB)  Eye drops  As of today, STOP taking any Aspirin (unless otherwise instructed by your surgeon) Aleve, Naproxen, Ibuprofen, Motrin, Advil, Goody's, BC's, all herbal medications, fish oil, and all vitamins.     After your COVID test   You are not required to quarantine however you are required to wear a well-fitting mask when you are out and around people not in your household.  If your mask becomes wet or soiled, replace with a new one.  Wash your hands often with soap and water for 20 seconds or clean your hands with an alcohol-based hand sanitizer that contains at least 60% alcohol.  Do not share personal items.  Notify your provider: if you are in close contact with someone who has COVID  or if you develop a fever of 100.4 or greater, sneezing, cough, sore throat, shortness of breath or body aches.             Do not wear jewelry or makeup Do not wear lotions, powders, perfumes/colognes, or deodorant. Men may shave face and neck. Do not bring valuables to the hospital. DO Not wear nail polish, gel polish, artificial nails, or any other type of covering on natural nails including finger and toenails. If patients have artificial nails, gel coating, etc. that need to be removed  by a nail salon, please have this removed prior to surgery or surgery may need to be canceled/delayed if the surgeon/ anesthesia feels like the patient is unable to be adequately monitored.             [facility] is not responsible for any belongings or valuables.  Do NOT Smoke (Tobacco/Vaping)  24 hours prior to your procedure  If you use a CPAP at night, you may bring your mask for your overnight stay.   Contacts, glasses, hearing aids, dentures or partials may not be worn into surgery, please bring cases for these belongings   For patients admitted to the hospital, discharge time will be determined by your treatment team.   Patients discharged the day of surgery will not be allowed to drive home, and someone needs to stay with them for 24 hours.  NO VISITORS WILL BE ALLOWED IN PRE-OP WHERE PATIENTS ARE PREPPED FOR SURGERY.  ONLY 1 SUPPORT PERSON MAY BE PRESENT IN THE WAITING ROOM WHILE YOU ARE IN SURGERY.  IF YOU ARE TO BE ADMITTED, ONCE YOU ARE IN YOUR ROOM YOU WILL BE ALLOWED TWO (2) VISITORS. 1 (ONE) VISITOR MAY STAY OVERNIGHT BUT MUST ARRIVE TO THE ROOM BY 8pm.  Minor children may have two parents present. Special consideration for safety and communication needs will be reviewed on a case by case basis.  Special instructions:    Oral Hygiene is also important to reduce your risk  of infection.  Remember - BRUSH YOUR TEETH THE MORNING OF SURGERY WITH YOUR REGULAR TOOTHPASTE   [facility]- Preparing For Surgery  Before surgery, you can play an important role. Because skin is not sterile, your skin needs to be as free of germs as possible. You can reduce the number of germs on your skin by washing with CHG (chlorahexidine gluconate) Soap before surgery.  CHG is an antiseptic cleaner which kills germs and bonds with the skin to continue killing germs even after washing.     Please do not use if you have an allergy to CHG or antibacterial soaps. If your skin becomes reddened/irritated  stop using the CHG.  Do not shave (including legs and underarms) for at least 48 hours prior to first CHG shower. It is OK to shave your face.  Please follow these instructions carefully.     Shower the NIGHT BEFORE SURGERY and the MORNING OF SURGERY with CHG Soap.   If you chose to wash your hair, wash your hair first as usual with your normal shampoo. After you shampoo, rinse your hair and body thoroughly to remove the shampoo.  Then [employer] and genitals (private parts) with your normal soap and rinse thoroughly to remove soap.  After that Use CHG Soap as you would any other liquid soap. You can apply CHG directly to the skin and wash gently with a scrungie or a clean washcloth.   Apply the CHG Soap to your body ONLY FROM THE NECK DOWN.  Do not use on open wounds or open sores. Avoid contact with your eyes, ears, mouth and genitals (private parts). Wash Face and genitals (private parts)  with your normal soap.   Wash thoroughly, paying special attention to the area where your surgery will be performed.  Thoroughly rinse your body with warm water from the neck down.  DO NOT shower/wash with your normal soap after using and rinsing off the CHG Soap.  Pat yourself dry with a CLEAN TOWEL.  Wear CLEAN PAJAMAS to bed the night before surgery  Place CLEAN SHEETS on your bed the night before your surgery  DO NOT SLEEP WITH PETS.   Day of Surgery: Take a shower with CHG soap. Wear Clean/Comfortable clothing the morning of surgery Do not apply any deodorants/lotions.   Remember to brush your teeth WITH YOUR REGULAR TOOTHPASTE.   Please read over the following fact sheets that you were given.

## 2021-06-25 ENCOUNTER — Encounter (HOSPITAL_COMMUNITY)
Admission: RE | Admit: 2021-06-25 | Discharge: 2021-06-25 | Disposition: A | Discharge status: Home / Self Care | Payer: 59 | Source: Ambulatory Visit | Attending: Vascular Surgery | Admitting: Vascular Surgery

## 2021-06-25 ENCOUNTER — Encounter (HOSPITAL_COMMUNITY): Payer: Self-pay

## 2021-06-25 ENCOUNTER — Other Ambulatory Visit: Payer: Self-pay

## 2021-06-25 VITALS — BP 170/92 | HR 86 | Temp 98.0°F | Resp 19 | Ht 69.0 in | Wt 133.3 lb

## 2021-06-25 DIAGNOSIS — I6521 Occlusion and stenosis of right carotid artery: Secondary | ICD-10-CM

## 2021-06-25 DIAGNOSIS — Z01818 Encounter for other preprocedural examination: Secondary | ICD-10-CM | POA: Insufficient documentation

## 2021-06-25 DIAGNOSIS — Z20822 Contact with and (suspected) exposure to covid-19: Secondary | ICD-10-CM | POA: Insufficient documentation

## 2021-06-25 HISTORY — DX: Cardiac murmur, unspecified: R01.1

## 2021-06-25 HISTORY — DX: Personal history of urinary calculi: Z87.442

## 2021-06-25 HISTORY — DX: Chronic obstructive pulmonary disease, unspecified: J44.9

## 2021-06-25 LAB — COMPREHENSIVE METABOLIC PANEL
ALT: 18 U/L (ref 0–44)
AST: 20 U/L (ref 15–41)
Albumin: 4.1 g/dL (ref 3.5–5.0)
Alkaline Phosphatase: 129 U/L — ABNORMAL HIGH (ref 38–126)
Anion gap: 4 — ABNORMAL LOW (ref 5–15)
BUN: 6 mg/dL — ABNORMAL LOW (ref 8–23)
CO2: 32 mmol/L (ref 22–32)
Calcium: 9.4 mg/dL (ref 8.9–10.3)
Chloride: 99 mmol/L (ref 98–111)
Creatinine, Ser: 0.81 mg/dL (ref 0.61–1.24)
GFR, Estimated: >60 mL/min (ref >60)
Glucose, Bld: 101 mg/dL — ABNORMAL HIGH (ref 70–99)
Potassium: 4.1 mmol/L (ref 3.5–5.1)
Sodium: 135 mmol/L (ref 135–145)
Total Bilirubin: 0.8 mg/dL (ref 0.3–1.2)
Total Protein: 7.2 g/dL (ref 6.5–8.1)

## 2021-06-25 LAB — CBC
HCT: 48.5 % (ref 39.0–52.0)
Hemoglobin: 16 g/dL (ref 13.0–17.0)
MCH: 30.2 pg (ref 26.0–34.0)
MCHC: 33 g/dL (ref 30.0–36.0)
MCV: 91.5 fL (ref 80.0–100.0)
Platelets: 246 10*3/uL (ref 150–400)
RBC: 5.3 MIL/uL (ref 4.22–5.81)
RDW: 12.4 % (ref 11.5–15.5)
WBC: 6.4 10*3/uL (ref 4.0–10.5)
nRBC: 0 % (ref 0.0–0.2)

## 2021-06-25 LAB — URINALYSIS, ROUTINE W REFLEX MICROSCOPIC
Bilirubin Urine: NEGATIVE
Glucose, UA: NEGATIVE mg/dL
Hgb urine dipstick: NEGATIVE
Ketones, ur: NEGATIVE mg/dL
Leukocytes,Ua: NEGATIVE
Nitrite: NEGATIVE
Protein, ur: NEGATIVE mg/dL
Specific Gravity, Urine: <1.005 — ABNORMAL LOW (ref 1.005–1.030)
pH: 6.5 (ref 5.0–8.0)

## 2021-06-25 LAB — APTT: aPTT: 30 seconds (ref 24–36)

## 2021-06-25 LAB — PROTIME-INR
INR: 1 (ref 0.8–1.2)
Prothrombin Time: 13 seconds (ref 11.4–15.2)

## 2021-06-25 LAB — SARS CORONAVIRUS 2 (TAT 6-24 HRS): SARS Coronavirus 2: NEGATIVE

## 2021-06-25 LAB — TYPE AND SCREEN
ABO/RH(D): O POS
Antibody Screen: NEGATIVE

## 2021-06-25 LAB — SURGICAL PCR SCREEN
MRSA, PCR: NEGATIVE
Staphylococcus aureus: NEGATIVE

## 2021-06-25 NOTE — Progress Notes (Signed)
PCP - Legrand Como hilts Cardiologist - Jenkins Rouge  PPM/ICD - denies   Chest x-ray - n/a EKG - 06/25/21 Stress Test - denies ECHO - 08/25/17 Cardiac Cath -09/24/15   Sleep Study - denies  As of today, STOP taking any Aspirin (unless otherwise instructed by your surgeon) Aleve, Naproxen, Ibuprofen, Motrin, Advil, Goody's, BC's, all herbal medications, fish oil, and all vitamins.  ERAS Protcol -no   COVID TEST- 06/25/21 in PAT   Anesthesia review: yes, cardiac history  Patient denies shortness of breath, fever, cough and chest pain at PAT appointment   All instructions explained to the patient, with a verbal understanding of the material. Patient agrees to go over the instructions while at home for a better understanding. Patient also instructed to self quarantine after being tested for COVID-19. The opportunity to ask questions was provided.

## 2021-06-28 ENCOUNTER — Inpatient Hospital Stay (HOSPITAL_COMMUNITY): Payer: 59 | Admitting: Anesthesiology

## 2021-06-28 ENCOUNTER — Inpatient Hospital Stay (HOSPITAL_COMMUNITY)
Admission: RE | Admit: 2021-06-28 | Discharge: 2021-06-29 | DRG: 036 | Disposition: A | Discharge status: Home / Self Care | Payer: 59 | Source: Ambulatory Visit | Attending: Vascular Surgery | Admitting: Vascular Surgery

## 2021-06-28 ENCOUNTER — Encounter (HOSPITAL_COMMUNITY): Payer: Self-pay | Admitting: Vascular Surgery

## 2021-06-28 ENCOUNTER — Other Ambulatory Visit: Payer: Self-pay

## 2021-06-28 ENCOUNTER — Inpatient Hospital Stay (HOSPITAL_COMMUNITY): Payer: 59

## 2021-06-28 ENCOUNTER — Encounter (HOSPITAL_COMMUNITY)
Admission: RE | Disposition: A | Discharge status: Home / Self Care | Payer: Self-pay | Source: Ambulatory Visit | Attending: Vascular Surgery

## 2021-06-28 DIAGNOSIS — Z20822 Contact with and (suspected) exposure to covid-19: Secondary | ICD-10-CM | POA: Diagnosis present

## 2021-06-28 DIAGNOSIS — F17210 Nicotine dependence, cigarettes, uncomplicated: Secondary | ICD-10-CM | POA: Diagnosis present

## 2021-06-28 DIAGNOSIS — Z809 Family history of malignant neoplasm, unspecified: Secondary | ICD-10-CM

## 2021-06-28 DIAGNOSIS — Z85820 Personal history of malignant melanoma of skin: Secondary | ICD-10-CM | POA: Diagnosis not present

## 2021-06-28 DIAGNOSIS — Z006 Encounter for examination for normal comparison and control in clinical research program: Secondary | ICD-10-CM | POA: Diagnosis not present

## 2021-06-28 DIAGNOSIS — I739 Peripheral vascular disease, unspecified: Secondary | ICD-10-CM | POA: Diagnosis present

## 2021-06-28 DIAGNOSIS — I2510 Atherosclerotic heart disease of native coronary artery without angina pectoris: Secondary | ICD-10-CM | POA: Diagnosis present

## 2021-06-28 DIAGNOSIS — J449 Chronic obstructive pulmonary disease, unspecified: Secondary | ICD-10-CM | POA: Diagnosis present

## 2021-06-28 DIAGNOSIS — Z7902 Long term (current) use of antithrombotics/antiplatelets: Secondary | ICD-10-CM

## 2021-06-28 DIAGNOSIS — Z8249 Family history of ischemic heart disease and other diseases of the circulatory system: Secondary | ICD-10-CM | POA: Diagnosis not present

## 2021-06-28 DIAGNOSIS — I10 Essential (primary) hypertension: Secondary | ICD-10-CM | POA: Diagnosis present

## 2021-06-28 DIAGNOSIS — I6521 Occlusion and stenosis of right carotid artery: Secondary | ICD-10-CM | POA: Diagnosis present

## 2021-06-28 DIAGNOSIS — E7800 Pure hypercholesterolemia, unspecified: Secondary | ICD-10-CM | POA: Diagnosis present

## 2021-06-28 DIAGNOSIS — Z79899 Other long term (current) drug therapy: Secondary | ICD-10-CM

## 2021-06-28 DIAGNOSIS — Z885 Allergy status to narcotic agent status: Secondary | ICD-10-CM | POA: Diagnosis not present

## 2021-06-28 DIAGNOSIS — Z7982 Long term (current) use of aspirin: Secondary | ICD-10-CM

## 2021-06-28 HISTORY — PX: TRANSCAROTID ARTERY REVASCULARIZATIONÂ: SHX6778

## 2021-06-28 SURGERY — TRANSCAROTID ARTERY REVASCULARIZATION (TCAR)
Anesthesia: General | Laterality: Right

## 2021-06-28 MED ORDER — ASPIRIN EC 81 MG PO TBEC
81.0000 mg | DELAYED_RELEASE_TABLET | Freq: Every day | ORAL | Status: DC
  Administered 2021-06-29: 81 mg via ORAL
  Filled 2021-06-28: qty 1

## 2021-06-28 MED ORDER — SODIUM CHLORIDE 0.9 % IV SOLN: INTRAVENOUS | Status: DC

## 2021-06-28 MED ORDER — GUAIFENESIN-DM 100-10 MG/5ML PO SYRP: 15.0000 mL | ORAL_SOLUTION | ORAL | Status: DC | PRN

## 2021-06-28 MED ORDER — HEMOSTATIC AGENTS (NO CHARGE) OPTIME
TOPICAL | Status: DC | PRN
  Administered 2021-06-28: 1 via TOPICAL

## 2021-06-28 MED ORDER — CEFAZOLIN SODIUM-DEXTROSE 2-4 GM/100ML-% IV SOLN
2.0000 g | Freq: Three times a day (TID) | INTRAVENOUS | Status: AC
  Administered 2021-06-28 (×2): 2 g via INTRAVENOUS
  Filled 2021-06-28 (×2): qty 100

## 2021-06-28 MED ORDER — FENTANYL CITRATE (PF) 100 MCG/2ML IJ SOLN
25.0000 ug | INTRAMUSCULAR | Status: DC | PRN
  Administered 2021-06-28 (×2): 25 ug via INTRAVENOUS

## 2021-06-28 MED ORDER — SODIUM CHLORIDE 0.9 % IV SOLN: 500.0000 mL | Freq: Once | INTRAVENOUS | Status: DC | PRN

## 2021-06-28 MED ORDER — LABETALOL HCL 5 MG/ML IV SOLN: 10.0000 mg | INTRAVENOUS | Status: DC | PRN

## 2021-06-28 MED ORDER — PHENOL 1.4 % MT LIQD: 1.0000 | OROMUCOSAL | Status: DC | PRN

## 2021-06-28 MED ORDER — LIDOCAINE 2% (20 MG/ML) 5 ML SYRINGE
INTRAMUSCULAR | Status: DC | PRN
  Administered 2021-06-28: 60 mg via INTRAVENOUS

## 2021-06-28 MED ORDER — PANTOPRAZOLE SODIUM 40 MG PO TBEC
40.0000 mg | DELAYED_RELEASE_TABLET | Freq: Every day | ORAL | Status: DC
  Administered 2021-06-28 – 2021-06-29 (×2): 40 mg via ORAL
  Filled 2021-06-28 (×2): qty 1

## 2021-06-28 MED ORDER — ALUM & MAG HYDROXIDE-SIMETH 200-200-20 MG/5ML PO SUSP: 15.0000 mL | ORAL | Status: DC | PRN

## 2021-06-28 MED ORDER — ACETAMINOPHEN 500 MG PO TABS: 1000.0000 mg | ORAL_TABLET | Freq: Once | ORAL | Status: DC | PRN

## 2021-06-28 MED ORDER — HYDRALAZINE HCL 20 MG/ML IJ SOLN: 5.0000 mg | INTRAMUSCULAR | Status: DC | PRN

## 2021-06-28 MED ORDER — CEFAZOLIN SODIUM-DEXTROSE 2-4 GM/100ML-% IV SOLN
INTRAVENOUS | Status: AC
  Filled 2021-06-28: qty 100

## 2021-06-28 MED ORDER — PROTAMINE SULFATE 10 MG/ML IV SOLN
INTRAVENOUS | Status: DC | PRN
  Administered 2021-06-28: 50 mg via INTRAVENOUS

## 2021-06-28 MED ORDER — HEPARIN SODIUM (PORCINE) 1000 UNIT/ML IJ SOLN
INTRAMUSCULAR | Status: DC | PRN
  Administered 2021-06-28: 6000 [IU] via INTRAVENOUS

## 2021-06-28 MED ORDER — METOPROLOL TARTRATE 5 MG/5ML IV SOLN: 2.0000 mg | INTRAVENOUS | Status: DC | PRN

## 2021-06-28 MED ORDER — DOCUSATE SODIUM 100 MG PO CAPS
100.0000 mg | ORAL_CAPSULE | Freq: Every day | ORAL | Status: DC
  Administered 2021-06-29: 100 mg via ORAL
  Filled 2021-06-28: qty 1

## 2021-06-28 MED ORDER — POLYETHYLENE GLYCOL 3350 17 G PO PACK: 17.0000 g | PACK | Freq: Every day | ORAL | Status: DC | PRN

## 2021-06-28 MED ORDER — MAGNESIUM SULFATE 2 GM/50ML IV SOLN: 2.0000 g | Freq: Every day | INTRAVENOUS | Status: DC | PRN

## 2021-06-28 MED ORDER — SUGAMMADEX SODIUM 200 MG/2ML IV SOLN
INTRAVENOUS | Status: DC | PRN
  Administered 2021-06-28: 150 mg via INTRAVENOUS

## 2021-06-28 MED ORDER — GLYCOPYRROLATE PF 0.2 MG/ML IJ SOSY
PREFILLED_SYRINGE | INTRAMUSCULAR | Status: DC | PRN
  Administered 2021-06-28: .2 mg via INTRAVENOUS

## 2021-06-28 MED ORDER — 0.9 % SODIUM CHLORIDE (POUR BTL) OPTIME
TOPICAL | Status: DC | PRN
  Administered 2021-06-28: 1000 mL

## 2021-06-28 MED ORDER — LIDOCAINE 2% (20 MG/ML) 5 ML SYRINGE
INTRAMUSCULAR | Status: AC
  Filled 2021-06-28: qty 5

## 2021-06-28 MED ORDER — DEXAMETHASONE SODIUM PHOSPHATE 10 MG/ML IJ SOLN
INTRAMUSCULAR | Status: AC
  Filled 2021-06-28: qty 1

## 2021-06-28 MED ORDER — HEPARIN SODIUM (PORCINE) 1000 UNIT/ML IJ SOLN
INTRAMUSCULAR | Status: AC
  Filled 2021-06-28: qty 10

## 2021-06-28 MED ORDER — PROTAMINE SULFATE 10 MG/ML IV SOLN
INTRAVENOUS | Status: AC
  Filled 2021-06-28: qty 5

## 2021-06-28 MED ORDER — CLOPIDOGREL BISULFATE 75 MG PO TABS
75.0000 mg | ORAL_TABLET | Freq: Every day | ORAL | Status: DC
  Administered 2021-06-29: 75 mg via ORAL
  Filled 2021-06-28: qty 1

## 2021-06-28 MED ORDER — DEXAMETHASONE SODIUM PHOSPHATE 10 MG/ML IJ SOLN
INTRAMUSCULAR | Status: DC | PRN
  Administered 2021-06-28: 10 mg via INTRAVENOUS

## 2021-06-28 MED ORDER — PROPOFOL 10 MG/ML IV BOLUS
INTRAVENOUS | Status: AC
  Filled 2021-06-28: qty 20

## 2021-06-28 MED ORDER — BISACODYL 10 MG RE SUPP: 10.0000 mg | Freq: Every day | RECTAL | Status: DC | PRN

## 2021-06-28 MED ORDER — EPHEDRINE 5 MG/ML INJ
INTRAVENOUS | Status: AC
  Filled 2021-06-28: qty 5

## 2021-06-28 MED ORDER — GLYCOPYRROLATE PF 0.2 MG/ML IJ SOSY
PREFILLED_SYRINGE | INTRAMUSCULAR | Status: AC
  Filled 2021-06-28: qty 1

## 2021-06-28 MED ORDER — POTASSIUM CHLORIDE CRYS ER 20 MEQ PO TBCR
20.0000 meq | EXTENDED_RELEASE_TABLET | Freq: Every day | ORAL | Status: DC | PRN
Start: 2021-06-28 — End: 2021-06-29

## 2021-06-28 MED ORDER — IODIXANOL 320 MG/ML IV SOLN
INTRAVENOUS | Status: DC | PRN
  Administered 2021-06-28: 15 mL via INTRA_ARTERIAL

## 2021-06-28 MED ORDER — FENTANYL CITRATE (PF) 250 MCG/5ML IJ SOLN
INTRAMUSCULAR | Status: AC
  Filled 2021-06-28: qty 5

## 2021-06-28 MED ORDER — OXYCODONE-ACETAMINOPHEN 5-325 MG PO TABS
1.0000 | ORAL_TABLET | ORAL | Status: DC | PRN
  Administered 2021-06-28 (×2): 1 via ORAL
  Filled 2021-06-28 (×2): qty 1

## 2021-06-28 MED ORDER — CEFAZOLIN SODIUM-DEXTROSE 2-4 GM/100ML-% IV SOLN
2.0000 g | INTRAVENOUS | Status: AC
  Administered 2021-06-28: 2 g via INTRAVENOUS
  Filled 2021-06-28: qty 100

## 2021-06-28 MED ORDER — EPHEDRINE SULFATE-NACL 50-0.9 MG/10ML-% IV SOSY
PREFILLED_SYRINGE | INTRAVENOUS | Status: DC | PRN
  Administered 2021-06-28: 15 mg via INTRAVENOUS
  Administered 2021-06-28 (×2): 5 mg via INTRAVENOUS

## 2021-06-28 MED ORDER — CHLORHEXIDINE GLUCONATE CLOTH 2 % EX PADS: 6.0000 | MEDICATED_PAD | Freq: Once | CUTANEOUS | Status: DC

## 2021-06-28 MED ORDER — ACETAMINOPHEN 10 MG/ML IV SOLN: 1000.0000 mg | Freq: Once | INTRAVENOUS | Status: DC | PRN

## 2021-06-28 MED ORDER — PHENYLEPHRINE 40 MCG/ML (10ML) SYRINGE FOR IV PUSH (FOR BLOOD PRESSURE SUPPORT)
PREFILLED_SYRINGE | INTRAVENOUS | Status: DC | PRN
  Administered 2021-06-28: 120 ug via INTRAVENOUS
  Administered 2021-06-28 (×4): 40 ug via INTRAVENOUS
  Administered 2021-06-28: 80 ug via INTRAVENOUS

## 2021-06-28 MED ORDER — ACETAMINOPHEN 325 MG PO TABS: 325.0000 mg | ORAL_TABLET | ORAL | Status: DC | PRN

## 2021-06-28 MED ORDER — ONDANSETRON HCL 4 MG/2ML IJ SOLN: 4.0000 mg | Freq: Four times a day (QID) | INTRAMUSCULAR | Status: DC | PRN

## 2021-06-28 MED ORDER — ACETAMINOPHEN 160 MG/5ML PO SOLN: 1000.0000 mg | Freq: Once | ORAL | Status: DC | PRN

## 2021-06-28 MED ORDER — ONDANSETRON HCL 4 MG/2ML IJ SOLN
INTRAMUSCULAR | Status: DC | PRN
  Administered 2021-06-28: 4 mg via INTRAVENOUS

## 2021-06-28 MED ORDER — ROCURONIUM BROMIDE 10 MG/ML (PF) SYRINGE
PREFILLED_SYRINGE | INTRAVENOUS | Status: AC
  Filled 2021-06-28: qty 10

## 2021-06-28 MED ORDER — ATORVASTATIN CALCIUM 40 MG PO TABS
40.0000 mg | ORAL_TABLET | Freq: Every day | ORAL | Status: DC
  Administered 2021-06-29: 40 mg via ORAL
  Filled 2021-06-28: qty 1

## 2021-06-28 MED ORDER — PROPOFOL 10 MG/ML IV BOLUS
INTRAVENOUS | Status: DC | PRN
  Administered 2021-06-28: 90 mg via INTRAVENOUS

## 2021-06-28 MED ORDER — AMLODIPINE BESYLATE 5 MG PO TABS
5.0000 mg | ORAL_TABLET | Freq: Every day | ORAL | Status: DC
  Administered 2021-06-29: 5 mg via ORAL
  Filled 2021-06-28: qty 1

## 2021-06-28 MED ORDER — PHENYLEPHRINE HCL-NACL 20-0.9 MG/250ML-% IV SOLN
INTRAVENOUS | Status: DC | PRN
  Administered 2021-06-28: 25 ug/min via INTRAVENOUS

## 2021-06-28 MED ORDER — POLYVINYL ALCOHOL 1.4 % OP SOLN
Freq: Every day | OPHTHALMIC | Status: DC | PRN
  Filled 2021-06-28: qty 15

## 2021-06-28 MED ORDER — OXYCODONE HCL 5 MG/5ML PO SOLN: 5.0000 mg | Freq: Once | ORAL | Status: DC | PRN

## 2021-06-28 MED ORDER — ROCURONIUM BROMIDE 10 MG/ML (PF) SYRINGE
PREFILLED_SYRINGE | INTRAVENOUS | Status: DC | PRN
  Administered 2021-06-28: 60 mg via INTRAVENOUS

## 2021-06-28 MED ORDER — IPRATROPIUM-ALBUTEROL 0.5-2.5 (3) MG/3ML IN SOLN: 3.0000 mL | Freq: Four times a day (QID) | RESPIRATORY_TRACT | Status: DC | PRN

## 2021-06-28 MED ORDER — LACTATED RINGERS IV SOLN: INTRAVENOUS | Status: DC | PRN

## 2021-06-28 MED ORDER — OXYCODONE HCL 5 MG PO TABS: 5.0000 mg | ORAL_TABLET | Freq: Once | ORAL | Status: DC | PRN

## 2021-06-28 MED ORDER — LACTATED RINGERS IV SOLN: INTRAVENOUS | Status: DC

## 2021-06-28 MED ORDER — MORPHINE SULFATE (PF) 2 MG/ML IV SOLN: 2.0000 mg | INTRAVENOUS | Status: DC | PRN

## 2021-06-28 MED ORDER — FENTANYL CITRATE (PF) 100 MCG/2ML IJ SOLN
INTRAMUSCULAR | Status: DC | PRN
  Administered 2021-06-28: 100 ug via INTRAVENOUS
  Administered 2021-06-28: 50 ug via INTRAVENOUS

## 2021-06-28 MED ORDER — ONDANSETRON HCL 4 MG/2ML IJ SOLN
INTRAMUSCULAR | Status: AC
  Filled 2021-06-28: qty 2

## 2021-06-28 MED ORDER — ORAL CARE MOUTH RINSE: 15.0000 mL | Freq: Once | OROMUCOSAL | Status: AC

## 2021-06-28 MED ORDER — CHLORHEXIDINE GLUCONATE 0.12 % MT SOLN
15.0000 mL | Freq: Once | OROMUCOSAL | Status: AC
  Administered 2021-06-28: 15 mL via OROMUCOSAL
  Filled 2021-06-28: qty 15

## 2021-06-28 MED ORDER — ACETAMINOPHEN 650 MG RE SUPP: 325.0000 mg | RECTAL | Status: DC | PRN

## 2021-06-28 MED ORDER — PHENYLEPHRINE 40 MCG/ML (10ML) SYRINGE FOR IV PUSH (FOR BLOOD PRESSURE SUPPORT)
PREFILLED_SYRINGE | INTRAVENOUS | Status: AC
  Filled 2021-06-28: qty 10

## 2021-06-28 MED ORDER — HEPARIN 6000 UNIT IRRIGATION SOLUTION
Status: DC | PRN
  Administered 2021-06-28: 1

## 2021-06-28 MED ORDER — ALBUTEROL SULFATE (2.5 MG/3ML) 0.083% IN NEBU: 2.5000 mg | INHALATION_SOLUTION | Freq: Four times a day (QID) | RESPIRATORY_TRACT | Status: DC | PRN

## 2021-06-28 SURGICAL SUPPLY — 63 items
ADH SKN CLS APL DERMABOND .7 (GAUZE/BANDAGES/DRESSINGS) ×2
BAG BANDED W/RUBBER/TAPE 36X54 (MISCELLANEOUS) ×2 IMPLANT
BAG COUNTER SPONGE SURGICOUNT (BAG) ×2 IMPLANT
BAG EQP BAND 135X91 W/RBR TAPE (MISCELLANEOUS) ×1
BAG SPNG CNTER NS LX DISP (BAG) ×1
BALLN STERLING RX 5X30X80 (BALLOONS) ×2
BALLN STERLING RX 5X40X80 (BALLOONS) ×2
BALLOON STERLING RX 5X30X80 (BALLOONS) ×1 IMPLANT
BALLOON STERLING RX 5X40X80 (BALLOONS) ×1 IMPLANT
CANISTER SUCT 3000ML PPV (MISCELLANEOUS) ×2 IMPLANT
CATH ROBINSON RED A/P 18FR (CATHETERS) IMPLANT
CLIP VESOCCLUDE MED 6/CT (CLIP) ×2 IMPLANT
CLIP VESOCCLUDE SM WIDE 6/CT (CLIP) ×2 IMPLANT
COVER DOME SNAP 22 D (MISCELLANEOUS) ×2 IMPLANT
COVER PROBE W GEL 5X96 (DRAPES) ×2 IMPLANT
DERMABOND ADVANCED (GAUZE/BANDAGES/DRESSINGS) ×2
DERMABOND ADVANCED .7 DNX12 (GAUZE/BANDAGES/DRESSINGS) ×2 IMPLANT
DRAPE FEMORAL ANGIO 80X135IN (DRAPES) ×2 IMPLANT
ELECT REM PT RETURN 9FT ADLT (ELECTROSURGICAL) ×2
ELECTRODE REM PT RTRN 9FT ADLT (ELECTROSURGICAL) ×1 IMPLANT
GLOVE SRG 8 PF TXTR STRL LF DI (GLOVE) ×1 IMPLANT
GLOVE SURG POLYISO LF SZ8 (GLOVE) IMPLANT
GLOVE SURG UNDER POLY LF SZ8 (GLOVE) ×2
GOWN STRL REUS W/ TWL LRG LVL3 (GOWN DISPOSABLE) ×2 IMPLANT
GOWN STRL REUS W/TWL 2XL LVL3 (GOWN DISPOSABLE) ×2 IMPLANT
GOWN STRL REUS W/TWL LRG LVL3 (GOWN DISPOSABLE) ×4
GUIDEWIRE ENROUTE 0.014 (WIRE) ×2 IMPLANT
HEMOSTAT SNOW SURGICEL 2X4 (HEMOSTASIS) IMPLANT
INTRODUCER KIT GALT 7CM (INTRODUCER) ×2
KIT BASIN OR (CUSTOM PROCEDURE TRAY) ×2 IMPLANT
KIT ENCORE 26 ADVANTAGE (KITS) ×2 IMPLANT
KIT INTRODUCER GALT 7 (INTRODUCER) ×1 IMPLANT
KIT TURNOVER KIT B (KITS) ×2 IMPLANT
NEEDLE HYPO 25GX1X1/2 BEV (NEEDLE) IMPLANT
PACK CAROTID (CUSTOM PROCEDURE TRAY) ×2 IMPLANT
POSITIONER HEAD DONUT 9IN (MISCELLANEOUS) ×2 IMPLANT
PROTECTION STATION PRESSURIZED (MISCELLANEOUS) ×2
SET MICROPUNCTURE 5F STIFF (MISCELLANEOUS) ×2 IMPLANT
SHEATH PINNACLE 5FR (SHEATH) IMPLANT
SHUNT CAROTID BYPASS 10 (VASCULAR PRODUCTS) IMPLANT
SHUNT CAROTID BYPASS 12FRX15.5 (VASCULAR PRODUCTS) IMPLANT
STATION PROTECTION PRESSURIZED (MISCELLANEOUS) ×1 IMPLANT
STENT TRANSCAROTID SYS 10X40 (Permanent Stent) ×2 IMPLANT
SUT MNCRL AB 4-0 PS2 18 (SUTURE) ×2 IMPLANT
SUT PROLENE 5 0 C 1 24 (SUTURE) ×2 IMPLANT
SUT PROLENE 6 0 BV (SUTURE) IMPLANT
SUT PROLENE 7 0 BV 1 (SUTURE) IMPLANT
SUT SILK 2 0 PERMA HAND 18 BK (SUTURE) IMPLANT
SUT SILK 2 0 SH (SUTURE) ×2 IMPLANT
SUT SILK 2 0 SH CR/8 (SUTURE) ×2 IMPLANT
SUT SILK 3 0 (SUTURE)
SUT SILK 3-0 18XBRD TIE 12 (SUTURE) IMPLANT
SUT VIC AB 3-0 SH 27 (SUTURE) ×2
SUT VIC AB 3-0 SH 27X BRD (SUTURE) ×1 IMPLANT
SYR 10ML LL (SYRINGE) ×6 IMPLANT
SYR 20ML LL LF (SYRINGE) ×2 IMPLANT
SYR CONTROL 10ML LL (SYRINGE) IMPLANT
SYSTEM TRANSCAROTID NEUROPRTCT (MISCELLANEOUS) ×1 IMPLANT
TOWEL GREEN STERILE (TOWEL DISPOSABLE) ×2 IMPLANT
TRANSCAROTID NEUROPROTECT SYS (MISCELLANEOUS) ×2
WATER STERILE IRR 1000ML POUR (IV SOLUTION) ×2 IMPLANT
WIRE BENTSON .035X145CM (WIRE) ×2 IMPLANT
WIRE STARTER BENTSON 035X150 (WIRE) ×2 IMPLANT

## 2021-06-28 NOTE — Transfer of Care (Signed)
Immediate Anesthesia Transfer of Care Note  Patient: CAMDYN BESKE  Procedure(s) Performed: RIGHT TRANSCAROTID ARTERY REVASCULARIZATION (Right)  Patient Location: PACU  Anesthesia Type:General  Level of Consciousness: awake, alert  and oriented  Airway & Oxygen Therapy: Patient Spontanous Breathing  Post-op Assessment: Report given to RN, Patient moving all extremities X 4 and Patient able to stick tongue midline  Post vital signs: Reviewed and stable  Last Vitals:  Vitals Value Taken Time  BP 113/70 06/28/21 0930  Temp    Pulse 77 06/28/21 0931  Resp 20 06/28/21 0931  SpO2 100 % 06/28/21 0931  Vitals shown include unvalidated device data.  Last Pain:  Vitals:   06/28/21 0611  PainSc: 0-No pain         Complications: No notable events documented.

## 2021-06-28 NOTE — H&P (Signed)
Office Note   Patient seen and examined in preop holding.  No complaints. No changes to medication history or physical exam since last seen in clinic. After discussing the risks and benefits of right internal carotid artery transcarotid artery revascularization, Jackson Mathews elected to proceed.   Broadus John MD   CC: Concern for symptomatic ICA restenosis Requesting Provider:  No ref. provider found  HPI: Jackson Mathews is a 65 y.o. (01-23-1956) male presenting at the request of .Hilts, Michael, MD. surgical history includes right carotid endarterectomy in 2012 with Dr. Trula Slade.  After his postoperative visit, Jackson Mathews was lost to follow-up.    The patient initially presented for carotid duplex ultrasonography after having an episode of blurry vision in his right eye.  Duplex ultrasonography demonstrated greater than 80% stenosis in the right internal carotid artery.  Ultrasonographer was concerned that the described event was a TIA, and therefore I saw the patient in clinic prior to him leaving the office.  On exam, Jackson Mathews was doing well.  He continues to work full-time for [employer]  He smokes 2 packs/day.  The episode described above has happened several times, and last occurred when driving.  He describes a dense fog in the right eye.  No complete vision loss, no bright light associated.  After several minutes, the dense fog lifts and his vision returns to normal.  This is never occurred with the left eye.  He denies any history of TIA, stroke, sensorimotor deficits, word finding difficulties.  The pt is not on a statin for cholesterol management.  The pt recently restarted a daily aspirin.   Other AC:  - The pt is  on medication for hypertension.   The pt is not diabetic.  Tobacco hx:  current every day  Past Medical History:  Diagnosis Date   CAD in native artery-cath 09/24/15 non obstructive 09/24/2015   Calf pain    Cancer (HCC)    Skin   Carotid artery  occlusion    Chest pain    COPD (chronic obstructive pulmonary disease) (HCC)    Dyspnea    Hearing loss    Heart murmur    History of kidney stones    History of right-sided carotid endarterectomy 09/23/2015   Hx of colonic polyp - ssp 06/21/2015   Hypercholesteremia    Hypertension    Lower back pain    Palpitations    Tinnitus    Tobacco use 09/23/2015   Vertigo     Past Surgical History:  Procedure Laterality Date   CARDIAC CATHETERIZATION N/A 09/24/2015   Procedure: Left Heart Cath and Coronary Angiography;  Surgeon: Burnell Blanks, MD;  Location: [place] [facility];  Service: Cardiovascular;  Laterality: N/A;   CAROTID ENDARTERECTOMY  05/05/11   Right CEA   MELANOMA EXCISION     l eye   SKIN CANCER EXCISION      Social History   Socioeconomic History   Marital status: Single    Spouse name: Not on file   Number of children: Not on file   Years of education: Not on file   Highest education level: Not on file  Occupational History   Not on file  Tobacco Use   Smoking status: Every Day    Packs/day: 2.00    Years: 30.00    Pack years: 60.00    Types: Cigarettes   Smokeless tobacco: Never   Tobacco comments:    2 ppd for 45 years (10/12/2018)  Vaping  Use   Vaping Use: Never used  Substance and Sexual Activity   Alcohol use: No    Alcohol/week: 0.0 standard drinks   Drug use: No   Sexual activity: Not on file  Other Topics Concern   Not on file  Social History Narrative   Not on file   Social Determinants of Health   Financial Resource Strain: Not on file  Food Insecurity: Not on file  Transportation Needs: Not on file  Physical Activity: Not on file  Stress: Not on file  Social Connections: Not on file  Intimate Partner Violence: Not on file   Family History  Problem Relation Age of Onset   Heart disease Mother    Cancer Father    Heart attack Father        s/p CABG   Diabetes Brother    Colon cancer Neg Hx    Esophageal cancer Neg  Hx    Ulcerative colitis Neg Hx    Stomach cancer Neg Hx    Rectal cancer Neg Hx     Current Facility-Administered Medications  Medication Dose Route Frequency Provider Last Rate Last Admin   0.9 %  sodium chloride infusion   Intravenous Continuous Broadus John, MD       ceFAZolin (ANCEF) IVPB 2g/100 mL premix  2 g Intravenous 30 min Pre-Op Broadus John, MD       Chlorhexidine Gluconate Cloth 2 % PADS 6 each  6 each Topical Once Broadus John, MD       And   Chlorhexidine Gluconate Cloth 2 % PADS 6 each  6 each Topical Once Broadus John, MD       lactated ringers infusion   Intravenous Continuous Oleta Mouse, MD        Allergies  Allergen Reactions   Codeine Nausea Only     REVIEW OF SYSTEMS:   [X]  denotes positive finding, [ ]  denotes negative finding Cardiac  Comments:  Chest pain or chest pressure:    Shortness of breath upon exertion:    Short of breath when lying flat:    Irregular heart rhythm:        Vascular    Pain in calf, thigh, or hip brought on by ambulation:    Pain in feet at night that wakes you up from your sleep:     Blood clot in your veins:    Leg swelling:         Pulmonary    Oxygen at home:    Productive cough:     Wheezing:         Neurologic    Sudden weakness in arms or legs:     Sudden numbness in arms or legs:     Sudden onset of difficulty speaking or slurred speech:    Temporary loss of vision in one eye:     Problems with dizziness:         Gastrointestinal    Blood in stool:     Vomited blood:         Genitourinary    Burning when urinating:     Blood in urine:        Psychiatric    Major depression:         Hematologic    Bleeding problems:    Problems with blood clotting too easily:        Skin    Rashes or ulcers:        Constitutional  Fever or chills:      PHYSICAL EXAMINATION:  Vitals:   06/28/21 0607  BP: (!) 161/66  Pulse: 81  Resp: 18  Temp: 98 F (36.7 C)  SpO2: 95%   Weight: 59.9 kg  Height: 5\' 9"  (1.753 m)    General:  WDWN in NAD; vital signs documented above Gait: Not observed HENT: WNL, normocephalic Pulmonary: normal non-labored breathing , without Rales, rhonchi,  wheezing Cardiac: regular HR,  Abdomen: soft, NT, no masses Skin: without rashes Vascular Exam/Pulses:  Right Left  Radial 2+ (normal) 2+ (normal)  Ulnar 2+ (normal) 2+ (normal)                   Extremities: without ischemic changes, without Gangrene , without cellulitis; without open wounds;  Musculoskeletal: no muscle wasting or atrophy  Neurologic: A&O X 3;  No focal weakness or paresthesias are detected Psychiatric:  The pt has Normal affect.   Non-Invasive Vascular Imaging:   Right Carotid Findings:  +----------+--------+--------+--------+------------------+-----------------  ----+            PSV cm/sEDV cm/sStenosisPlaque DescriptionComments                +----------+--------+--------+--------+------------------+-----------------  ----+  CCA Prox  70      16                                                        +----------+--------+--------+--------+------------------+-----------------  ----+  CCA Mid   54      15              heterogenous                              +----------+--------+--------+--------+------------------+-----------------  ----+  CCA Distal74      17              heterogenous                              +----------+--------+--------+--------+------------------+-----------------  ----+  ICA Prox  601     313     80-99%  hypoechoic        stenosis                                                                    approximately  2.6 cm                                                       in length                +----------+--------+--------+--------+------------------+-----------------    ASSESSMENT/PLAN: Jackson Mathews is a 65 y.o. male presenting with right ICA restenosis.   The vision changes described are concerning for amaurosis fugax.  Carotid duplex ultrasonography demonstrates greater than 80% stenosis of the right proximal internal carotid  artery. No neurological sequela were appreciated in clinic.   I would classify Jackson Mathews carotid disease as symptomatic right internal carotid restenosis. He was immediately started on dual antiplatelet therapy, high intensity statin. My office has also scheduled him to have a CT scan immediately in an effort to assess the carotid artery lesion for preoperative planning. I will give him a call on Tuesday with the results of his CT scan and we will work to schedule his surgery as soon as possible.  Current surgical options include redo carotid arterectomy versus TCAR.  This decision will be made once the CT angiogram results.  Jackson Mathews was educated on the signs and symptoms of TIA, stroke, amaurosis.  He was asked to seek immediate medical attention should any of these occur.  I also talked to follow-up about smoking cessation, as this is the likely driver for his restenosis.   Broadus John, MD [facility] [phone]

## 2021-06-28 NOTE — Progress Notes (Signed)
Pt admitted to rm 4 from PACU. Initiated tele. CHG wipe given. Oriented pt to the unit. Call bell within reach.   Lavenia Atlas, RN

## 2021-06-28 NOTE — Anesthesia Procedure Notes (Signed)
Procedure Name: Intubation Date/Time: 06/28/2021 7:40 AM Performed by: Barrington Ellison, CRNA Pre-anesthesia Checklist: Patient identified, Emergency Drugs available, Suction available and Patient being monitored Patient Re-evaluated:Patient Re-evaluated prior to induction Oxygen Delivery Method: Circle System Utilized Preoxygenation: Pre-oxygenation with 100% oxygen Induction Type: IV induction Ventilation: Mask ventilation without difficulty Laryngoscope Size: Mac and 4 Grade View: Grade I Tube type: Oral Tube size: 7.5 mm Number of attempts: 1 Airway Equipment and Method: Stylet and Oral airway Placement Confirmation: ETT inserted through vocal cords under direct vision, positive ETCO2 and breath sounds checked- equal and bilateral Secured at: 22 cm Tube secured with: Tape Dental Injury: Teeth and Oropharynx as per pre-operative assessment

## 2021-06-28 NOTE — Op Note (Signed)
NAME: Jackson Mathews    MRN: [id number] DOB: [birth year]    DATE OF OPERATION: 06/28/2021  PREOP DIAGNOSIS:    Symptomatic right internal carotid artery stenosis  POSTOP DIAGNOSIS:    Same  PROCEDURE:    Right sided transcarotid artery revascularization  SURGEON: Broadus John  ASSIST: Leontine Locket  ANESTHESIA: general   EBL: 55ml  INDICATIONS:    Jackson Mathews is a 65 y.o. male with history of previous right internal carotid artery stenosis, status post carotid arterectomy in 2012.  He presented in the clinic with imaging demonstrating concern for restenosis.  Patient also had symptoms of white light amaurosis.  After discussing the risk and benefits of transcarotid artery revascularization for symptomatic carotid artery stenosis, Jackson Mathews elected to proceed.  FINDINGS:   27mm right internal carotid artery lesion Lesion required post stenting balloon angioplasty using a 5 x 30 mm balloon  TECHNIQUE:   The patient was brought to the operating room, where support lines were placed and general anesthesia was secured. The RIGHT neck and groins were prepped and the patient was sterilely draped. A transverse 2-4 cm incision was made between the sternal and clavicular heads of the sternocleidomastoid muscle, below the omohyoid. Following longitudinal division of the carotid sheath the jugular vein was partially dissected and retracted medially. Once 3 cm of common carotid artery (CCA) were isolated, umbilical tape was placed around the proximal 1/3 of the CCA under direct vision. A 5.0 polypropylene suture was pre-placed in the anterior wall of the CCA, in a "U stitch" configuration, close to the clavicle to facilitate hemostasis upon removal of the arterial sheath at completion of the TCAR procedure.  The right common femoral vein (CFV) was accessed under ultrasound guidance, using standard Seldinger and micropuncture access technique. Permanent recorded image(s) was/were  saved in the patient's medical record. The Venous Return Sheath was advanced into the CFV over the 0.035" wire provided. Blood was aspirated from the flow line followed by flushing of the Venous Sheath with heparinized saline. The Venous Sheath was secured to the patient's skin with suture to maintain optimal position in the vessel.  Heparin was given to obtain a therapeutic activated clotting time >250 seconds prior to arterial access. A 4-French non-stiffened ENHANCE Transcarotid / Peripheral Access set was used, puncturing the artery with the 21G needle through the pre-placed "U" stitch while holding gentle traction on the umbilical tape to stabilize and centralize the CCA within the incision. Careful attention was paid to the change in CCA shape when using the umbilical tape to control or lift the artery. The micropuncture wire was then advanced 3-4 cm into the CCA and, the 21G needle was removed. The micropuncture sheath was advanced 2-3 cm into the CCA and the wire and dilator were removed. Pulsatile backflow indicated correct positioning. The provided 0.035" J-tipped guidewire was inserted as close as possible to the bifurcation without engaging the lesion. After micropuncture sheath removal, the Transcarotid Arterial Sheath was advanced to the 2.5cm marker and the 0.035" wire and dilator were then removed. Arterial Sheath position was assessed under fluoroscopy in two projections to ensure that the sheath tip was oriented coaxially in the CCA. The Arterial Sheath was sutured to the patient with gentle forward tension. Blood was slowly aspirated followed by flushing with heparinized saline. No ingress of air bubbles through the passive hemostatic valve was observed. The stopcocks were closed. Traction applied to the CCA previously to facilitate access was gently released.  The  Flow Controller was connected to the Transcarotid Arterial Sheath, prepared by passively allowing a column of arterial blood to  fill the line and connected to the Venous Return Sheath. CCA inflow was occluded proximal to the arteriotomy with a vascular clamp to achieve active flow reversal. To confirm flow reversal, a saline bolus was delivered into the venous flow line on both "High" and "Low" flow settings of the Flow Controller. Angiograms were performed with slow injections of a small amount of contrast filling just past the lesion to minimize antegrade transmission of micro-bubbles.  Prior to lesion manipulation, heart rate (22LNL) and systolic BP (892-119ERDE) were managed upwards to optimize flow reversal and procedural neuroprotection. The lesion was crossed with an 0.014" ENROUTE guidewire and pre-dilation of the lesion was performed with a 30mm x 60mm rapid exchange 0.014" compatible balloon catheter to 8 atmospheres for 10 seconds. Stenting was performed with an 70mm x 88mm ENROUTE Transcarotid stent, sized appropriately to the right CCA. AP (gentle contrast injections) were performed to confirm stent placement and arterial wall stent apposition.  Imaging demonstrated this the stent was narrowed likely due to intimal hyperplasia over the last 10 years.  Therefore, a 5 x 30 mm balloon was brought to the field and the stent postdilated.  This demonstrated an excellent result with resolution of the stenosis.  At [facility] case completion, antegrade flow was restored by releasing the clamp on the CCA then closing the NPS stopcocks to the flow lines. The Transcarotid Arterial Sheath was removed and the pre-closure suture was tied. Heparin reversal was employed and a drain was placed.  The Venous Return Sheath was removed and hemostasis was achieved with brief manual compression.  The patient tolerated the procedure well and was extubated on the table. The patient was moving all four extremities to command prior to transfer to the recovery room.   Given the complexity of the case a first assistant was necessary in order to  expedient the procedure and safely perform the technical aspects of the operation.  Macie Burows, MD [facility]  DATE OF DICTATION:   06/28/2021

## 2021-06-28 NOTE — Anesthesia Procedure Notes (Signed)
Arterial Line Insertion Start/End12/11/2020 7:00 AM, 06/28/2021 7:20 AM Performed by: Amadeo Garnet, CRNA, CRNA  Preanesthetic checklist: patient identified and risks and benefits discussed Lidocaine 1% used for infiltration Left, radial was placed Catheter size: 20 G Hand hygiene performed  and maximum sterile barriers used  Allen's test indicative of satisfactory collateral circulation Attempts: 1 Procedure performed without using ultrasound guided technique. Following insertion, dressing applied and Biopatch. Post procedure assessment: normal  Patient tolerated the procedure well with no immediate complications.

## 2021-06-28 NOTE — Discharge Instructions (Signed)
   [facility] [place]  Discharge Instructions   Carotid Surgery  Please refer to the following instructions for your post-procedure care. Your surgeon or physician assistant will discuss any changes with you.  Activity  You are encouraged to walk as much as you can. You can slowly return to normal activities but must avoid strenuous activity and heavy lifting until your doctor tell you it's okay. Avoid activities such as vacuuming or swinging a golf club. You can drive after one week if you are comfortable and you are no longer taking prescription pain medications. It is normal to feel tired for serval weeks after your surgery. It is also normal to have difficulty with sleep habits, eating, and bowel movements after surgery. These will go away with time.  Bathing/Showering  Shower daily after you go home. Do not soak in a bathtub, hot tub, or swim until the incision heals completely.  Incision Care  Shower every day. Clean your incision with mild soap and water. Pat the area dry with a clean towel. You do not need a bandage unless otherwise instructed. Do not apply any ointments or creams to your incision. You may have skin glue on your incision. Do not peel it off. It will come off on its own in about one week. Your incision may feel thickened and raised for several weeks after your surgery. This is normal and the skin will soften over time.   For Men Only: It's okay to shave around the incision but do not shave the incision itself for 2 weeks. It is common to have numbness under your chin that could last for several months.  Diet  Resume your normal diet. There are no special food restrictions following this procedure. A low fat/low cholesterol diet is recommended for all patients with vascular disease. In order to heal from your surgery, it is CRITICAL to get adequate nutrition. Your body requires vitamins, minerals, and protein. Vegetables are the best source of  vitamins and minerals. Vegetables also provide the perfect balance of protein. Processed food has little nutritional value, so try to avoid this.  Medications  Resume taking all of your medications unless your doctor or physician assistant tells you not to. If your incision is causing pain, you may take over-the- counter pain relievers such as acetaminophen (Tylenol). If you were prescribed a stronger pain medication, please be aware these medications can cause nausea and constipation. Prevent nausea by taking the medication with a snack or meal. Avoid constipation by drinking plenty of fluids and eating foods with a high amount of fiber, such as fruits, vegetables, and grains.   Do not take Tylenol if you are taking prescription pain medications.  Follow Up  Our office will schedule a follow up appointment 2-3 weeks following discharge.  Please call us immediately for any of the following conditions  . Increased pain, redness, drainage (pus) from your incision site. . Fever of 101 degrees or higher. . If you should develop stroke (slurred speech, difficulty swallowing, weakness on one side of your body, loss of vision) you should call 911 and go to the nearest emergency room. .  Reduce your risk of vascular disease:  . Stop smoking. If you would like help call [facility] at 1-800-QUIT-NOW (1-800-784-8669) or [place] at 336-586-4000. . Manage your cholesterol . Maintain a desired weight . Control your diabetes . Keep your blood pressure down .  If you have any questions, please call the office at [phone]. 

## 2021-06-28 NOTE — Progress Notes (Signed)
PHARMACIST LIPID MONITORING   Jackson Mathews is a 65 y.o. male admitted on 06/28/2021 with ICA stenosis s/p CEA.  Pharmacy has been consulted to optimize lipid-lowering therapy with the indication of secondary prevention for clinical ASCVD.  Recent Labs:LDL 97 in 2017 current LDL ordered for AML   Lipid Panel (last 6 months):   No results found for: CHOL, TRIG, HDL, CHOLHDL, VLDL, LDLCALC, LDLDIRECT  Hepatic function panel (last 6 months):   Lab Results  Component Value Date   AST 20 06/25/2021   ALT 18 06/25/2021   ALKPHOS 129 (H) 06/25/2021   BILITOT 0.8 06/25/2021    SCr (since admission):   Serum creatinine: 0.81 mg/dL 06/25/21 0820 Estimated creatinine clearance: 78.1 mL/min  Current therapy and lipid therapy tolerance Current lipid-lowering therapy: atorvastatin 40mg  daily   Assessment:   Will assess lipid panel in am and make decision then    [place].D. CPP, BCPS Clinical Pharmacist [phone] 06/28/2021 12:35 PM

## 2021-06-28 NOTE — Anesthesia Preprocedure Evaluation (Signed)
Anesthesia Evaluation  Patient identified by MRN, date of birth, ID band Patient awake    Reviewed: Allergy & Precautions, NPO status , Patient's Chart, lab work & pertinent test results, reviewed documented beta blocker date and time   History of Anesthesia Complications Negative for: history of anesthetic complications  Airway Mallampati: III  TM Distance: >3 FB Neck ROM: Full    Dental  (+) Dental Advisory Given, Edentulous Upper   Pulmonary neg shortness of breath, neg sleep apnea, COPD, neg recent URI, Current Smoker and Patient abstained from smoking.,    breath sounds clear to auscultation       Cardiovascular hypertension, Pt. on medications and Pt. on home beta blockers (-) angina+ CAD and + Peripheral Vascular Disease  (-) Past MI and (-) CHF  Rhythm:Regular + Systolic murmurs 4268: - Left ventricle: The cavity size was normal. Wall thickness was  normal. Systolic function was normal. The estimated ejection  fraction was in the range of 55% to 60%. Wall motion was normal;  there were no regional wall motion abnormalities. Doppler  parameters are consistent with abnormal left ventricular  relaxation (grade 1 diastolic dysfunction).  - Aortic valve: Trileaflet; mildly thickened, mildly calcified  leaflets.   ? Mid RCA lesion, 60% stenosed. This is a small non-dominant vessel. ? Mid Cx lesion, 40% stenosed. ? Ost LAD to Mid LAD lesion, 20% stenosed. ? The left ventricular systolic function is normal.   1. Mild to moderate non-obstructive CAD. There is a moderately severe stenosis in the small non-dominant RCA.  2. Normal LV systolic function  Recommendations: Long acting nitrates. Smoking cessation.    No stenosis of AV on last echo   Neuro/Psych negative neurological ROS  negative psych ROS   GI/Hepatic negative GI ROS, Neg liver ROS,   Endo/Other  negative endocrine ROS  Renal/GU negative Renal  ROSLab Results      Component                Value               Date                      CREATININE               0.81                06/25/2021                Musculoskeletal negative musculoskeletal ROS (+)   Abdominal   Peds  Hematology negative hematology ROS (+) Lab Results      Component                Value               Date                      WBC                      6.4                 06/25/2021                HGB                      16.0                06/25/2021  HCT                      48.5                06/25/2021                MCV                      91.5                06/25/2021                PLT                      246                 06/25/2021              Anesthesia Other Findings   Reproductive/Obstetrics                             Anesthesia Physical Anesthesia Plan  ASA: 2  Anesthesia Plan: General   Post-op Pain Management: Tylenol PO (pre-op)   Induction: Intravenous  PONV Risk Score and Plan: 1 and Ondansetron and Dexamethasone  Airway Management Planned: Oral ETT  Additional Equipment: Arterial line  Intra-op Plan:   Post-operative Plan: Extubation in OR  Informed Consent: I have reviewed the patients History and Physical, chart, labs and discussed the procedure including the risks, benefits and alternatives for the proposed anesthesia with the patient or authorized representative who has indicated his/her understanding and acceptance.     Dental advisory given  Plan Discussed with: CRNA and Anesthesiologist  Anesthesia Plan Comments:         Anesthesia Quick Evaluation

## 2021-06-29 ENCOUNTER — Encounter (HOSPITAL_COMMUNITY): Payer: Self-pay | Admitting: Vascular Surgery

## 2021-06-29 DIAGNOSIS — I6521 Occlusion and stenosis of right carotid artery: Secondary | ICD-10-CM

## 2021-06-29 DIAGNOSIS — Z95828 Presence of other vascular implants and grafts: Secondary | ICD-10-CM

## 2021-06-29 DIAGNOSIS — Z006 Encounter for examination for normal comparison and control in clinical research program: Secondary | ICD-10-CM

## 2021-06-29 LAB — BASIC METABOLIC PANEL
Anion gap: 8 (ref 5–15)
BUN: 9 mg/dL (ref 8–23)
CO2: 28 mmol/L (ref 22–32)
Calcium: 8.5 mg/dL — ABNORMAL LOW (ref 8.9–10.3)
Chloride: 99 mmol/L (ref 98–111)
Creatinine, Ser: 0.79 mg/dL (ref 0.61–1.24)
GFR, Estimated: >60 mL/min (ref >60)
Glucose, Bld: 132 mg/dL — ABNORMAL HIGH (ref 70–99)
Potassium: 4.3 mmol/L (ref 3.5–5.1)
Sodium: 135 mmol/L (ref 135–145)

## 2021-06-29 LAB — LIPID PANEL
Cholesterol: 97 mg/dL (ref 0–200)
HDL: 37 mg/dL — ABNORMAL LOW (ref >40)
LDL Cholesterol: 50 mg/dL (ref 0–99)
Total CHOL/HDL Ratio: 2.6 RATIO
Triglycerides: 49 mg/dL (ref <150)
VLDL: 10 mg/dL (ref 0–40)

## 2021-06-29 LAB — CBC
HCT: 39.2 % (ref 39.0–52.0)
Hemoglobin: 12.8 g/dL — ABNORMAL LOW (ref 13.0–17.0)
MCH: 29.4 pg (ref 26.0–34.0)
MCHC: 32.7 g/dL (ref 30.0–36.0)
MCV: 89.9 fL (ref 80.0–100.0)
Platelets: 237 10*3/uL (ref 150–400)
RBC: 4.36 MIL/uL (ref 4.22–5.81)
RDW: 12.3 % (ref 11.5–15.5)
WBC: 10.9 10*3/uL — ABNORMAL HIGH (ref 4.0–10.5)
nRBC: 0 % (ref 0.0–0.2)

## 2021-06-29 LAB — POCT ACTIVATED CLOTTING TIME
Activated Clotting Time: 137 seconds
Activated Clotting Time: 275 seconds
Activated Clotting Time: 143 seconds

## 2021-06-29 MED ORDER — OXYCODONE-ACETAMINOPHEN 5-325 MG PO TABS: 1.0000 | ORAL_TABLET | Freq: Four times a day (QID) | ORAL | 0 refills | Status: DC | PRN

## 2021-06-29 NOTE — Anesthesia Postprocedure Evaluation (Signed)
Anesthesia Post Note  Patient: Jackson Mathews  Procedure(s) Performed: RIGHT TRANSCAROTID ARTERY REVASCULARIZATION (Right)     Patient location during evaluation: PACU Anesthesia Type: General Level of consciousness: awake and alert Pain management: pain level controlled Vital Signs Assessment: post-procedure vital signs reviewed and stable Respiratory status: spontaneous breathing, nonlabored ventilation, respiratory function stable and patient connected to nasal cannula oxygen Cardiovascular status: blood pressure returned to baseline and stable Postop Assessment: no apparent nausea or vomiting Anesthetic complications: no   No notable events documented.  Last Vitals:  Vitals:   06/29/21 0400 06/29/21 0737  BP: 129/68 (!) 135/54  Pulse: 74 71  Resp: 20 19  Temp: 36.7 C 36.8 C  SpO2: 96% 97%    Last Pain:  Vitals:   06/29/21 0737  TempSrc: Oral  PainSc: 0-No pain                 Rakeya Glab

## 2021-06-29 NOTE — Discharge Summary (Signed)
Carotid Discharge Summary     Jackson Mathews 06-May-1956 65 y.o. male  [id number]  Admission Date: 06/28/2021  Discharge Date: 06/29/21   Physician: Broadus John, MD  Admission Diagnosis: Carotid artery stenosis, symptomatic, right [facility]  Hospital Course:  The patient was admitted to the hospital and taken to the operating room on 06/28/2021 and underwent Right Transcarotid Revascularization.  The pt tolerated the procedure well and was transported to the PACU in excellent condition.  By POD 1, the pt neuro status remains intact. Hemodynamically stable. Right neck incision well appearing without swelling or hematoma. Right common femoral vein access site without swelling or hematoma. He ambulated without difficulty. Voiding without difficulty.  The remainder of the hospital course consisted of increasing mobilization and increasing intake of solids without difficulty.  He remained stable for discharge home. He will continue all previously prescribed home medications including Aspirin,statin and Plavix. PDMP was reviewed and pain medication was sent to patients requested pharmacy. He will have follow up in 1 month with Dr. Virl Cagey with Carotid Duplex.   Recent Labs    06/29/21 0457  NA 135  K 4.3  CL 99  CO2 28  GLUCOSE 132*  BUN 9  CALCIUM 8.5*   Recent Labs    06/29/21 0457  WBC 10.9*  HGB 12.8*  HCT 39.2  PLT 237   No results for input(s): INR in the last 72 hours.   Discharge Instructions     Call MD for:  difficulty breathing, headache or visual disturbances   Complete by: As directed    Call MD for:  redness, tenderness, or signs of infection (pain, swelling, redness, odor or green/yellow discharge around incision site)   Complete by: As directed    Call MD for:  severe uncontrolled pain   Complete by: As directed    Call MD for:  temperature >100.4   Complete by: As directed    Diet - low sodium heart healthy   Complete by: As directed     Discharge patient   Complete by: As directed    Discharge disposition: 01-Home or Self Care   Discharge patient date: 06/29/2021   Discharge wound care:   Complete by: As directed    Wash with mild soap and water, pat dry   Driving Restrictions   Complete by: As directed    No driving while taking pain medication   Increase activity slowly   Complete by: As directed    Lifting restrictions   Complete by: As directed    No heavy lifting, pushing, pulling > 10 lbs       Discharge Diagnosis:  Carotid artery stenosis, symptomatic, right [I65.21]  Secondary Diagnosis: Patient Active Problem List   Diagnosis Date Noted   Carotid artery stenosis, symptomatic, right 06/28/2021   Hyponatremia 10/14/2018   Leukocytosis 10/14/2018   Hypoxia 10/12/2018   Tobacco use 10/12/2018   Bronchitis due to tobacco use 10/12/2018   Lower urinary tract symptoms (LUTS) 63/07/6008   Systolic murmur 93/23/5573   Actinic keratosis 08/18/2017   Essential hypertension 10/12/2015   HLD (hyperlipidemia) 10/12/2015   CAD in native artery-cath 09/24/15 non obstructive 09/24/2015   Peripheral vascular disease ([place]) 09/23/2015   Carotid artery disease ([place]) 09/23/2015   History of right-sided carotid endarterectomy 09/23/2015   Nicotine dependence with current use 09/23/2015   Hx of colonic polyp - ssp 06/21/2015   HEARING LOSS 08/20/2010   DYSPNEA 08/20/2010   SKIN CANCER, HX OF 08/20/2010   Past  Medical History:  Diagnosis Date   CAD in native artery-cath 09/24/15 non obstructive 09/24/2015   Calf pain    Cancer (HCC)    Skin   Carotid artery occlusion    Chest pain    COPD (chronic obstructive pulmonary disease) (HCC)    Dyspnea    Hearing loss    Heart murmur    History of kidney stones    History of right-sided carotid endarterectomy 09/23/2015   Hx of colonic polyp - ssp 06/21/2015   Hypercholesteremia    Hypertension    Lower back pain    Palpitations    Tinnitus    Tobacco use  09/23/2015   Vertigo     Allergies as of 06/29/2021       Reactions   Codeine Nausea Only        Medication List     STOP taking these medications    carvedilol 3.125 MG tablet Commonly known as: COREG   tamsulosin 0.4 MG Caps capsule Commonly known as: FLOMAX       TAKE these medications    albuterol 108 (90 Base) MCG/ACT inhaler Commonly known as: VENTOLIN HFA INHALE 2 PUFFS BY MOUTH EVERY 6 HOURS INTO THE LUNGS AS NEEDED FOR WHEEZING OR SHORTNESS OF BREATH   amLODipine 5 MG tablet Commonly known as: NORVASC Take 5 mg by mouth daily.   aspirin EC 81 MG tablet Take 1 tablet (81 mg total) by mouth daily. Swallow whole.   atorvastatin 40 MG tablet Commonly known as: Lipitor Take 1 tablet (40 mg total) by mouth daily.   clopidogrel 75 MG tablet Commonly known as: PLAVIX Take 1 tablet (75 mg total) by mouth daily.   ipratropium-albuterol 0.5-2.5 (3) MG/3ML Soln Commonly known as: DUONEB Take 3 mLs by nebulization every 6 (six) hours as needed (Shortness of breath or wheezing).   Nebulizer Devi 1 Device by Does not apply route as needed.   oxyCODONE-acetaminophen 5-325 MG tablet Commonly known as: PERCOCET/ROXICET Take 1 tablet by mouth every 6 (six) hours as needed for moderate pain.   sildenafil 50 MG tablet Commonly known as: VIAGRA Take 1 tablet (50 mg total) by mouth daily as needed for erectile dysfunction.   SYSTANE OP Place 1 drop into both eyes daily as needed (dry eyes).               Discharge Care Instructions  (From admission, onward)           Start     Ordered   06/29/21 0000  Discharge wound care:       Comments: Wash with mild soap and water, pat dry   06/29/21 0752             Discharge Instructions:   [facility] Discharge Instructions Carotid Endarterectomy (CEA)  Please refer to the following instructions for your post-procedure care. Your surgeon or physician assistant  will discuss any changes with you.  Activity  You are encouraged to walk as much as you can. You can slowly return to normal activities but must avoid strenuous activity and heavy lifting until your doctor tell you it's OK. Avoid activities such as vacuuming or swinging a golf club. You can drive after one week if you are comfortable and you are no longer taking prescription pain medications. It is normal to feel tired for serval weeks after your surgery. It is also normal to have difficulty with sleep habits, eating, and bowel movements after surgery. These  will go away with time.  Bathing/Showering  You may shower after you come home. Do not soak in a bathtub, hot tub, or swim until the incision heals completely.  Incision Care  Shower every day. Clean your incision with mild soap and water. Pat the area dry with a clean towel. You do not need a bandage unless otherwise instructed. Do not apply any ointments or creams to your incision. You may have skin glue on your incision. Do not peel it off. It will come off on its own in about one week. Your incision may feel thickened and raised for several weeks after your surgery. This is normal and the skin will soften over time. For Men Only: It's OK to shave around the incision but do not shave the incision itself for 2 weeks. It is common to have numbness under your chin that could last for several months.  Diet  Resume your normal diet. There are no special food restrictions following this procedure. A low fat/low cholesterol diet is recommended for all patients with vascular disease. In order to heal from your surgery, it is CRITICAL to get adequate nutrition. Your body requires vitamins, minerals, and protein. Vegetables are the best source of vitamins and minerals. Vegetables also provide the perfect balance of protein. Processed food has little nutritional value, so try to avoid this.  Medications  Resume taking all of your medications unless  your doctor or physician assistant tells you not to.  If your incision is causing pain, you may take over-the- counter pain relievers such as acetaminophen (Tylenol). If you were prescribed a stronger pain medication, please be aware these medications can cause nausea and constipation.  Prevent nausea by taking the medication with a snack or meal. Avoid constipation by drinking plenty of fluids and eating foods with a high amount of fiber, such as fruits, vegetables, and grains. Do not take Tylenol if you are taking prescription pain medications.  Follow Up  Our office will schedule a follow up appointment 2-3 weeks following discharge.  Please call us immediately for any of the following conditions  Increased pain, redness, drainage (pus) from your incision site. Fever of 101 degrees or higher. If you should develop stroke (slurred speech, difficulty swallowing, weakness on one side of your body, loss of vision) you should call 911 and go to the nearest emergency room.  Reduce your risk of vascular disease:  Stop smoking. If you would like help call [facility] at 1-800-QUIT-NOW 207-426-1514) or [place] at [phone]. Manage your cholesterol Maintain a desired weight Control your diabetes Keep your blood pressure down  If you have any questions, please call the office at [phone].  Prescriptions given: Oxycodone-Acetaminophen 5- 325 mg #15 No Refill  Disposition: Home  Patient's condition: is Excellent  Follow up: 1. Dr. Virl Cagey in 4 weeks.   Afua Hoots PA-C [facility] [phone]   --- For [facility] Registry use ---   Modified Rankin score at D/C (0-6): 0  IV medication needed for:  1. Hypertension: No 2. Hypotension: No  Post-op Complications: No  1. Post-op CVA or TIA: No  If yes: Event classification (right eye, left eye, right cortical, left cortical, verterobasilar, other): N/A  If yes: Timing of event (intra-op, <6 hrs post-op,  >=6 hrs post-op, unknown): N/A  2. CN injury: No  If yes: CN not injuried   3. Myocardial infarction: No  If yes: Dx by (EKG or clinical, Troponin): N/A  4.  CHF: No  5.  Dysrhythmia (new): No  6. Wound infection: No  7. Reperfusion symptoms: No  8. Return to OR: No  If yes: return to OR for (bleeding, neurologic, other CEA incision, other): N/A  Discharge medications: Statin use:  Yes ASA use:  Yes   Beta blocker use:  Yes ACE-Inhibitor use:  No  ARB use:  No CCB use: Yes P2Y12 Antagonist use: Yes, [ X] Plavix, [ ]  Plasugrel, [ ]  Ticlopinine, [ ]  Ticagrelor, [ ]  Other, [ ]  No for medical reason, [ ]  Non-compliant, [ ]  Not-indicated Anti-coagulant use:  No, [ ]  Warfarin, [ ]  Rivaroxaban, [ ]  Dabigatran

## 2021-06-29 NOTE — Progress Notes (Addendum)
  Progress Note    06/29/2021 7:36 AM 1 Day Post-Op  Subjective:  feeling good. Ambulated in hall last night without trouble. Tolerating diet. Voiding without difficulty   Vitals:   06/29/21 0200 06/29/21 0400  BP: 128/63 129/68  Pulse: 64 74  Resp: 17 20  Temp:  98.1 F (36.7 C)  SpO2: 96% 96%   Physical Exam: Cardiac:  regular Lungs:  non labored  Incisions:  right supraclavicular incision is well appearing, no swelling or hematoma Extremities: right femoral vein access site clean, dry and intact without swelling or hematoma.  well perfused and warm. Moving without any deficits Abdomen:  flat soft, non tender Neurologic: alert and oriented. Smile symmetric. Tongue midline. Speech coherent. Sensation is intact and  equal bilaterally  CBC    Component Value Date/Time   WBC 10.9 (H) 06/29/2021 0457   RBC 4.36 06/29/2021 0457   HGB 12.8 (L) 06/29/2021 0457   HCT 39.2 06/29/2021 0457   PLT 237 06/29/2021 0457   MCV 89.9 06/29/2021 0457   MCH 29.4 06/29/2021 0457   MCHC 32.7 06/29/2021 0457   RDW 12.3 06/29/2021 0457   LYMPHSABS 0.5 (L) 10/12/2018 1543   MONOABS 1.3 (H) 10/12/2018 1543   EOSABS 0.0 10/12/2018 1543   BASOSABS 0.0 10/12/2018 1543    BMET    Component Value Date/Time   NA 135 06/29/2021 0457   K 4.3 06/29/2021 0457   CL 99 06/29/2021 0457   CO2 28 06/29/2021 0457   GLUCOSE 132 (H) 06/29/2021 0457   BUN 9 06/29/2021 0457   CREATININE 0.79 06/29/2021 0457   CALCIUM 8.5 (L) 06/29/2021 0457   GFRNONAA >60 06/29/2021 0457   GFRAA >60 10/13/2018 0409    INR    Component Value Date/Time   INR 1.0 06/25/2021 0820     Intake/Output Summary (Last 24 hours) at 06/29/2021 0736 Last data filed at 06/29/2021 0656 Gross per 24 hour  Intake 2360 ml  Output 1895 ml  Net 465 ml     Assessment/Plan:  65 y.o. male is s/p right TCAR 1 Day Post-Op   Neurologically intact Incision is clean, dry and intact without swelling or hematoma Well  appearing Hemodynamically stable Tolerating diet Voiding without difficulty He will continue Aspirin, Statin and Plavix He is stable for discharge home. He will have follow up in 1 month with carotid duplex  Karoline Caldwell, PA-C [facility] [phone] 06/29/2021 7:36 AM   VASCULAR STAFF ADDENDUM: I have independently interviewed and examined the patient. I agree with the above.    Cassandria Santee, MD [facility] Phone Number: [phone] 06/29/2021 9:20 AM

## 2021-06-29 NOTE — Progress Notes (Signed)
Discharge instructions provided to patient. All medications, follow up appointments, and discharge instructions provided to patient. IV out. Monitor off CCMD notified. Discharging to home.  Era Bumpers, RN

## 2021-06-30 ENCOUNTER — Encounter (HOSPITAL_COMMUNITY): Payer: Self-pay | Admitting: Vascular Surgery

## 2021-07-01 ENCOUNTER — Encounter (HOSPITAL_COMMUNITY): Payer: Self-pay | Admitting: Vascular Surgery

## 2021-07-02 ENCOUNTER — Telehealth: Payer: Self-pay

## 2021-07-02 NOTE — Telephone Encounter (Signed)
I spoke with patient to inform him that I completed his FMLA ppw, faxed and received a successful confirmation. Patient voiced his understanding.

## 2021-07-13 ENCOUNTER — Other Ambulatory Visit: Payer: Self-pay

## 2021-07-13 DIAGNOSIS — I6521 Occlusion and stenosis of right carotid artery: Secondary | ICD-10-CM

## 2021-07-21 ENCOUNTER — Telehealth (INDEPENDENT_AMBULATORY_CARE_PROVIDER_SITE_OTHER): Payer: Self-pay

## 2021-07-21 NOTE — Telephone Encounter (Signed)
Transition Care Management Follow-up Telephone Call Date of discharge and from where: 06/29/21 from [facility] How have you been since you were released from the hospital? "I have been improving every day". Any questions or concerns? No  Items Reviewed: Did the pt receive and understand the discharge instructions provided? Yes  Medications obtained and verified? Yes  Other? No  Any new allergies since your discharge? No  Dietary orders reviewed? Yes Do you have support at home? Yes   Home Care and Equipment/Supplies: Were home health services ordered? no If so, what is the name of the agency? Not applicable  Has the agency set up a time to come to the patient's home? not applicable Were any new equipment or medical supplies ordered?  No What is the name of the medical supply agency? Not applicable Were you able to get the supplies/equipment? not applicable Do you have any questions related to the use of the equipment or supplies? No  Functional Questionnaire: (I = Independent and D = Dependent) ADLs: I  Bathing/Dressing- I  Meal Prep- I  Eating- I  Maintaining continence- I  Transferring/Ambulation- I  Managing Meds- I  Follow up appointments reviewed:  PCP Hospital f/u appt confirmed? Yes  Saw Dr. Junius Roads the week after procedure.  [facility] f/u appt confirmed? Yes  Scheduled to see Dr. Unk Lightning on 07/30/21 @ 8:30 am. Are transportation arrangements needed? No  If their condition worsens, is the pt aware to call PCP or go to the Emergency Dept.? Yes Was the patient provided with contact information for the PCP's office or ED? Yes Was to pt encouraged to call back with questions or concerns? Yes    Thea Silversmith, RN, MSN, BSN, [place] Care Management Coordinator [phone]

## 2021-07-30 ENCOUNTER — Encounter: Payer: Self-pay | Admitting: Physician Assistant

## 2021-07-30 ENCOUNTER — Other Ambulatory Visit: Payer: Self-pay

## 2021-07-30 ENCOUNTER — Ambulatory Visit (INDEPENDENT_AMBULATORY_CARE_PROVIDER_SITE_OTHER): Payer: 59 | Admitting: Physician Assistant

## 2021-07-30 ENCOUNTER — Ambulatory Visit (HOSPITAL_COMMUNITY)
Admission: RE | Admit: 2021-07-30 | Discharge: 2021-07-30 | Disposition: A | Discharge status: Home / Self Care | Payer: 59 | Source: Ambulatory Visit | Attending: Vascular Surgery | Admitting: Vascular Surgery

## 2021-07-30 VITALS — BP 150/80 | HR 75 | Temp 98.4°F | Resp 20 | Ht 69.0 in | Wt 130.9 lb

## 2021-07-30 DIAGNOSIS — I6521 Occlusion and stenosis of right carotid artery: Secondary | ICD-10-CM | POA: Insufficient documentation

## 2021-07-30 DIAGNOSIS — I6529 Occlusion and stenosis of unspecified carotid artery: Secondary | ICD-10-CM

## 2021-07-30 NOTE — Progress Notes (Addendum)
POST OPERATIVE OFFICE NOTE    CC:  F/u for surgery  HPI:  This is a 66 y.o. male who is s/p right TCAR on 06/28/21 by Dr. Virl Cagey.  He was symptomatic with white light amaurosis and history of previous right carotid endarterectomy in 2012.  He denise symptoms of stroke or TIA.  No new amaurosis, aphasia, or weakness.He is cutting down on his smoking habit with plans to quite.    He is on ASA, Plavix and Lipitor daily.    Allergies  Allergen Reactions   Codeine Nausea Only    Current Outpatient Medications  Medication Sig Dispense Refill   albuterol (VENTOLIN HFA) 108 (90 Base) MCG/ACT inhaler INHALE 2 PUFFS BY MOUTH EVERY 6 HOURS INTO THE LUNGS AS NEEDED FOR WHEEZING OR SHORTNESS OF BREATH 8.5 g 11   amLODipine (NORVASC) 5 MG tablet Take 5 mg by mouth daily.     aspirin EC 81 MG tablet Take 1 tablet (81 mg total) by mouth daily. Swallow whole. 30 tablet 11   atorvastatin (LIPITOR) 40 MG tablet Take 1 tablet (40 mg total) by mouth daily. 30 tablet 6   clopidogrel (PLAVIX) 75 MG tablet Take 1 tablet (75 mg total) by mouth daily. 30 tablet 6   ipratropium-albuterol (DUONEB) 0.5-2.5 (3) MG/3ML SOLN Take 3 mLs by nebulization every 6 (six) hours as needed (Shortness of breath or wheezing). 360 mL 0   oxyCODONE-acetaminophen (PERCOCET/ROXICET) 5-325 MG tablet Take 1 tablet by mouth every 6 (six) hours as needed for moderate pain. 15 tablet 0   Polyethyl Glycol-Propyl Glycol (SYSTANE OP) Place 1 drop into both eyes daily as needed (dry eyes).     Respiratory Therapy Supplies (NEBULIZER) DEVI 1 Device by Does not apply route as needed. 1 each 0   sildenafil (VIAGRA) 50 MG tablet Take 1 tablet (50 mg total) by mouth daily as needed for erectile dysfunction. 30 tablet 6   No current facility-administered medications for this visit.     ROS:  See HPI  Physical Exam:    Right Carotid Findings:  +----------+--------+--------+--------+----------------------+--------+              PSV cm/s EDV  cm/s Stenosis Plaque Description     Comments   +----------+--------+--------+--------+----------------------+--------+   CCA Prox   79       24                                                  +----------+--------+--------+--------+----------------------+--------+   CCA Mid    74       23                smooth and homogeneous            +----------+--------+--------+--------+----------------------+--------+   CCA Distal 73       24                                       stent      +----------+--------+--------+--------+----------------------+--------+   ICA Prox   77       24  stent      +----------+--------+--------+--------+----------------------+--------+   ICA Mid    95       33                                                  +----------+--------+--------+--------+----------------------+--------+   ICA Distal 100      35                                                  +----------+--------+--------+--------+----------------------+--------+   ECA        107      23                                                  +----------+--------+--------+--------+----------------------+--------+   +----------+--------+-------+----------------+-------------------+              PSV cm/s EDV cms Describe         Arm Pressure (mmHG)   +----------+--------+-------+----------------+-------------------+   Subclavian 244      0       Multiphasic, WNL                       +----------+--------+-------+----------------+-------------------+   +---------+--------+--+--------+--+---------+   Vertebral PSV cm/s 67 EDV cm/s 17 Antegrade   +---------+--------+--+--------+--+---------+       Right Stent(s):  +---------------+--+--++++   Prox to Stent   89 28      +---------------+--+--++++   Proximal Stent  91 27      +---------------+--+--++++   Mid Stent       84 22      +---------------+--+--++++   Distal Stent    86 23      +---------------+--+--++++   Distal to  Stent 82 29      +---------------+--+--++++            Left Carotid Findings:  +----------+--------+--------+--------+--------------------------+--------+               PSV cm/s EDV cm/s Stenosis Plaque Description           Comments   +----------+--------+--------+--------+--------------------------+--------+    CCA Prox   111      28                                                        +----------+--------+--------+--------+--------------------------+--------+    CCA Mid    89       22                                                        +----------+--------+--------+--------+--------------------------+--------+    CCA Distal 77       26                                                        +----------+--------+--------+--------+--------------------------+--------+  ICA Prox   122      36       1-39%    irregular and heterogenous              +----------+--------+--------+--------+--------------------------+--------+    ICA Mid    145      45       40-59%                                           +----------+--------+--------+--------+--------------------------+--------+    ICA Distal 127      31                                                        +----------+--------+--------+--------+--------------------------+--------+    ECA        200      25       >50%     calcific and irregular                  +----------+--------+--------+--------+--------------------------+--------+    +----------+--------+--------+----------------+-------------------+              PSV cm/s EDV cm/s Describe         Arm Pressure (mmHG)   +----------+--------+--------+----------------+-------------------+   Subclavian 77       0        Multiphasic, WNL                       +----------+--------+--------+----------------+-------------------+   +---------+--------+-+--------+------+   Vertebral PSV cm/s 0 EDV cm/s Absent   +---------+--------+-+--------+------+     Summary:   Right Carotid: There is no evidence of stenosis in the right ICA. Patent  stent.   Left Carotid: Velocities in the left ICA are consistent with a 40-59%  stenosis.   Vertebrals:  Right vertebral artery demonstrates antegrade flow. Left  vertebral               artery demonstrates no discernable flow.  Subclavians: Normal flow hemodynamics were seen in bilateral subclavian               arteries.   Incision:  Well healed without erythema or edema Extremities:  5/5 strength in all 4 extremities, palpable radial pulses B Neuro: no tongue deviation or facial droop Lungs non labored breathing   Assessment/Plan:  This is a 66 y.o. male who is s/p: Re do right carotid endarterectomy with symptoms of amaurosis with TCAR intervention of right carotid restenosis.    Duplex demonstrates patent stent without evidence of re stenosis Left ICA stenosis of 40-59%  He will f/u in 9 months for repeat carotid duplex surveillance.  We reviewed signs and symptoms of stroke if these occur he will call 911.  He can return to work full duties on Monday 08/02/21.    Roxy Horseman PA-C [facility] [phone]   Clinic MD:  Virl Cagey

## 2021-08-03 ENCOUNTER — Other Ambulatory Visit: Payer: Self-pay | Admitting: *Deleted

## 2021-08-03 DIAGNOSIS — I6529 Occlusion and stenosis of unspecified carotid artery: Secondary | ICD-10-CM

## 2021-12-31 ENCOUNTER — Other Ambulatory Visit: Payer: Self-pay | Admitting: Physician Assistant

## 2022-02-19 IMAGING — CT CT ANGIO NECK
1 of 11 series · 5 of 33 positions shown · IV contrast (OMNIPAQUE)
Comparison: Carotid Doppler ultrasound report from 06/11/2021. Head
CT 09/20/2019.

CLINICAL DATA: Carotid artery stenosis. Left-sided numbness.
Episodic right-sided blurry vision.

EXAM:
CT ANGIOGRAPHY HEAD AND NECK
TECHNIQUE: Multidetector CT imaging of the head and neck was performed using
the standard protocol during bolus administration of intravenous
contrast. Multiplanar CT image reconstructions and MIPs were
obtained to evaluate the vascular anatomy. Carotid stenosis
measurements (when applicable) are obtained utilizing NASCET
criteria, using the distal internal carotid diameter as the
denominator.
CONTRAST:  75mL OMNIPAQUE IOHEXOL 350 MG/ML SOLN

[Series 11: ax thin · axial · 0.39mm/px · z∈[-284,-24]mm · 5 of 390 slices shown]
[im 65/390  soft-tissue]
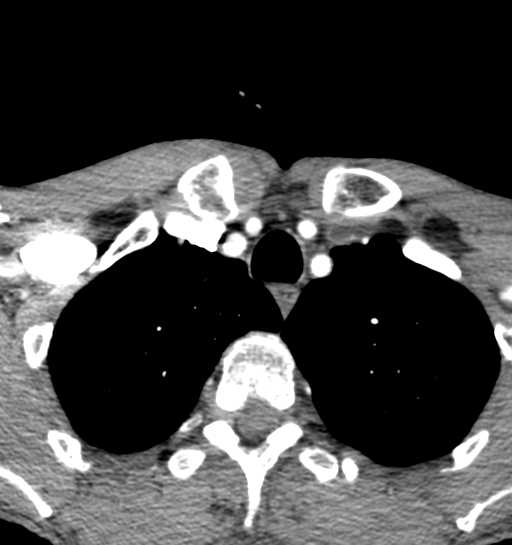
[im 130/390  bone]
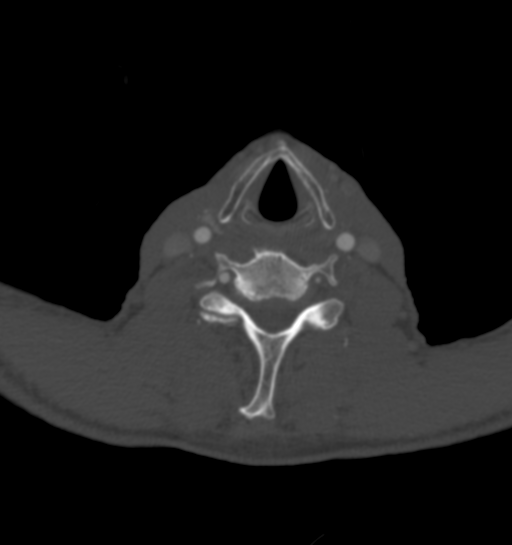
[im 195/390  soft-tissue]
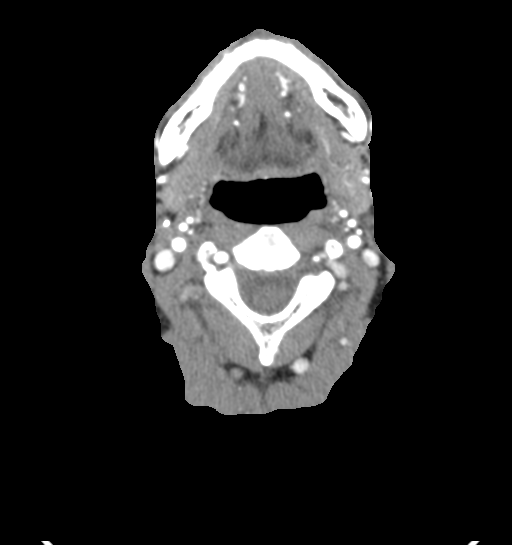
[im 260/390  bone]
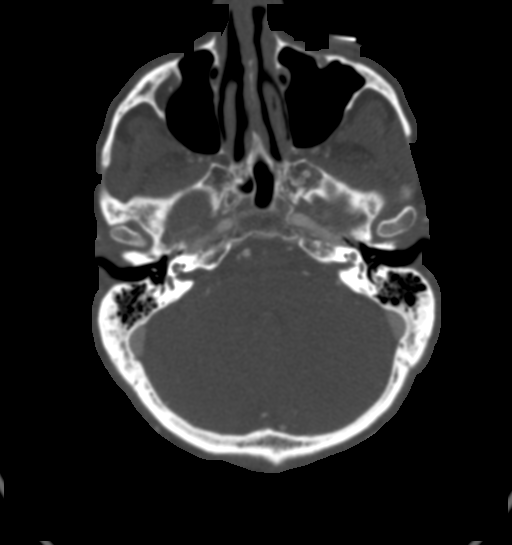
[im 325/390  soft-tissue]
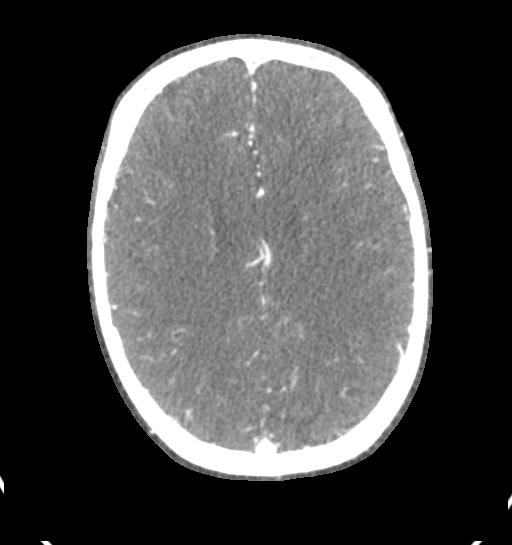

[5 of 33 positions shown; findings below may reference images not displayed]

FINDINGS: CT HEAD FINDINGS

Brain: There is no evidence of an acute infarct, intracranial
hemorrhage, mass, midline shift, or extra-axial fluid collection.
The ventricles and sulci are normal.

Vascular: Calcified atherosclerosis at the skull base.

Skull: No fracture or suspicious osseous lesion.

Sinuses: Paranasal sinuses and mastoid air cells are clear.

Orbits: Unremarkable.

Review of the MIP images confirms the above findings

CTA NECK FINDINGS

Aortic arch: Normal variant aortic arch branching pattern with
common origin of the brachiocephalic and left common carotid
arteries. Moderate amount of soft and calcified plaque without
significant arch vessel origins stenosis. 60% stenosis of the more
distal left subclavian artery immediately proximal to the left
vertebral artery origin.

Right carotid system: Patent with ulcerated plaque versus web in the
distal common carotid artery. Extensive soft plaque in the proximal
ICA resulting in a high-grade stenosis estimated at 80%.

Left carotid system: Patent with mixed calcified and soft plaque
about the carotid bifurcation resulting in luminal irregularity but
no significant stenosis.

Vertebral arteries: Both vertebral arteries are patent without
evidence of a significant stenosis or dissection on the right. There
is evidence of a severe stenosis of the left vertebral origin with
the left vertebral artery being patent but diffusely small in
caliber distal to this throughout the neck.

Skeleton: Cervical disc degeneration most advanced at C5-6 where
uncovertebral spurring results in moderate to severe right and
moderate left neural foraminal stenosis.

Other neck: Surgical clips in the right neck. No evidence of
cervical lymphadenopathy or mass.

Upper chest: Centrilobular emphysema.

Review of the MIP images confirms the above findings

CTA HEAD FINDINGS

Anterior circulation: The internal carotid arteries are patent from
skull base to carotid termini with mild-to-moderate right and mild
left-sided atherosclerosis not resulting in significant stenosis. A
1 mm outpouching from the right supraclinoid ICA in the posterior
communicating region is favored with represent an infundibulum. ACAs
and MCAs are patent without evidence of a proximal branch occlusion
or significant proximal stenosis.

Posterior circulation: The intracranial vertebral arteries are
patent to the basilar with the right being dominant. The basilar
artery is widely patent. There is a large left posterior
communicating artery with diminutive left P1 segment. Both PCAs are
patent with mild irregularity and up to mild narrowing of both P2
segments. No aneurysm is identified.

Venous sinuses: Patent.

Anatomic variants: Fetal left PCA.

Review of the MIP images confirms the above findings
IMPRESSION: 1. 80% proximal right ICA stenosis.
2. Ulcerated plaque versus web in the distal right common carotid
artery.
3. Severe left vertebral artery origin stenosis.
4. 60% stenosis of the left subclavian artery immediately proximal
to the left vertebral origin.
5. Mild intracranial atherosclerosis without major branch occlusion
or significant proximal stenosis.
6. Aortic Atherosclerosis (WBDAI-922.2) and Emphysema (WBDAI-RSY.7).

## 2022-02-19 IMAGING — CT CT ANGIO HEAD
1 of 11 series · 5 of 33 positions shown · IV contrast (OMNIPAQUE)
Comparison: Carotid Doppler ultrasound report from 06/11/2021. Head
CT 09/20/2019.

CLINICAL DATA: Carotid artery stenosis. Left-sided numbness.
Episodic right-sided blurry vision.

EXAM:
CT ANGIOGRAPHY HEAD AND NECK
TECHNIQUE: Multidetector CT imaging of the head and neck was performed using
the standard protocol during bolus administration of intravenous
contrast. Multiplanar CT image reconstructions and MIPs were
obtained to evaluate the vascular anatomy. Carotid stenosis
measurements (when applicable) are obtained utilizing NASCET
criteria, using the distal internal carotid diameter as the
denominator.
CONTRAST:  75mL OMNIPAQUE IOHEXOL 350 MG/ML SOLN

[Series 11: ax thin · axial · 0.39mm/px · z∈[-284,-24]mm · 5 of 390 slices shown]
[im 65/390  soft-tissue]
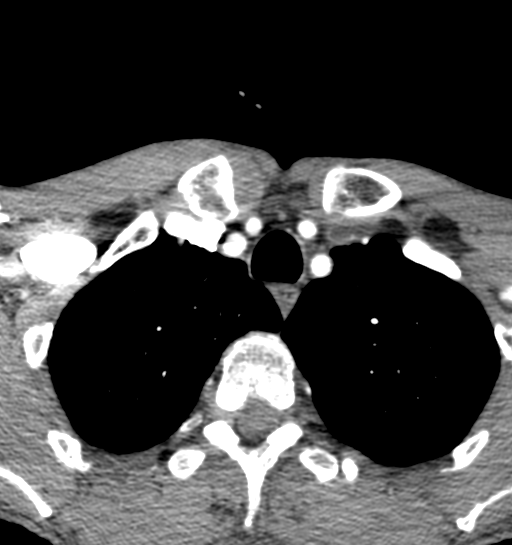
[im 130/390  bone]
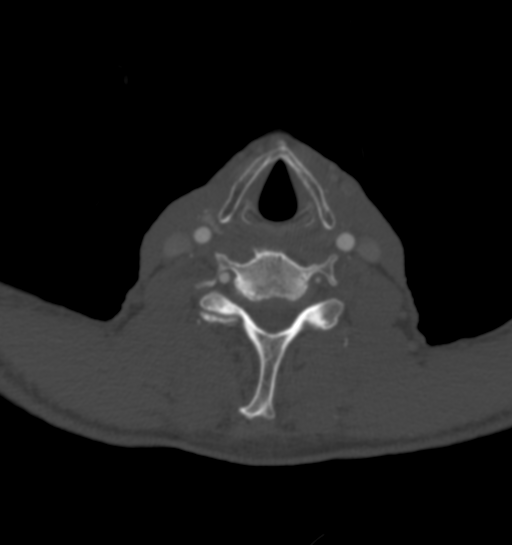
[im 195/390  soft-tissue]
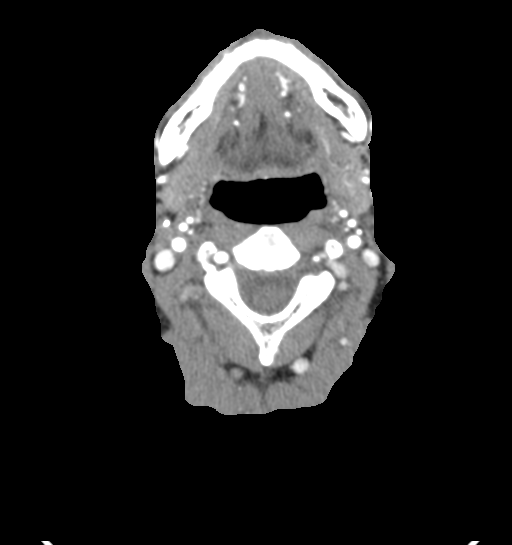
[im 260/390  bone]
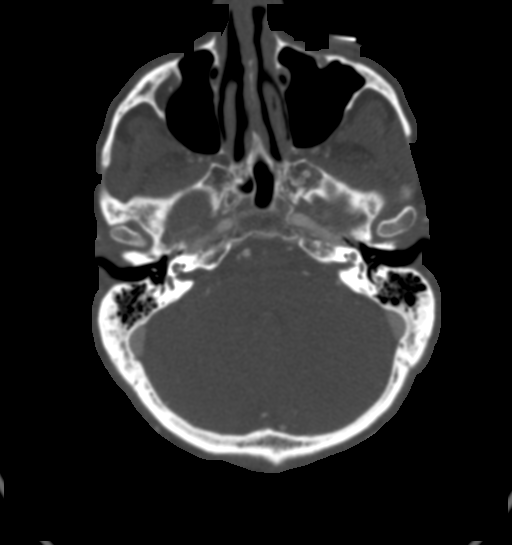
[im 325/390  soft-tissue]
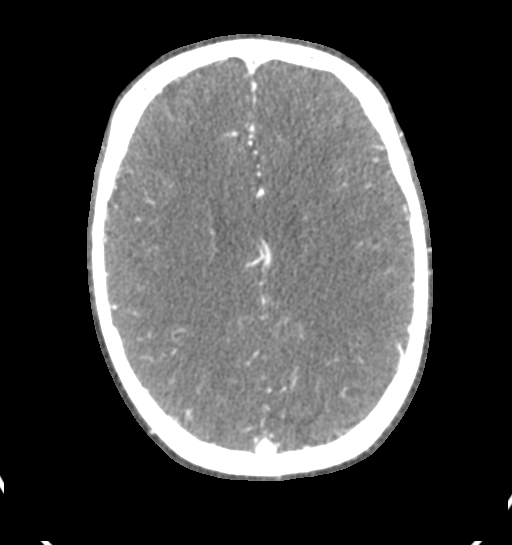

[5 of 33 positions shown; findings below may reference images not displayed]

FINDINGS: CT HEAD FINDINGS

Brain: There is no evidence of an acute infarct, intracranial
hemorrhage, mass, midline shift, or extra-axial fluid collection.
The ventricles and sulci are normal.

Vascular: Calcified atherosclerosis at the skull base.

Skull: No fracture or suspicious osseous lesion.

Sinuses: Paranasal sinuses and mastoid air cells are clear.

Orbits: Unremarkable.

Review of the MIP images confirms the above findings

CTA NECK FINDINGS

Aortic arch: Normal variant aortic arch branching pattern with
common origin of the brachiocephalic and left common carotid
arteries. Moderate amount of soft and calcified plaque without
significant arch vessel origins stenosis. 60% stenosis of the more
distal left subclavian artery immediately proximal to the left
vertebral artery origin.

Right carotid system: Patent with ulcerated plaque versus web in the
distal common carotid artery. Extensive soft plaque in the proximal
ICA resulting in a high-grade stenosis estimated at 80%.

Left carotid system: Patent with mixed calcified and soft plaque
about the carotid bifurcation resulting in luminal irregularity but
no significant stenosis.

Vertebral arteries: Both vertebral arteries are patent without
evidence of a significant stenosis or dissection on the right. There
is evidence of a severe stenosis of the left vertebral origin with
the left vertebral artery being patent but diffusely small in
caliber distal to this throughout the neck.

Skeleton: Cervical disc degeneration most advanced at C5-6 where
uncovertebral spurring results in moderate to severe right and
moderate left neural foraminal stenosis.

Other neck: Surgical clips in the right neck. No evidence of
cervical lymphadenopathy or mass.

Upper chest: Centrilobular emphysema.

Review of the MIP images confirms the above findings

CTA HEAD FINDINGS

Anterior circulation: The internal carotid arteries are patent from
skull base to carotid termini with mild-to-moderate right and mild
left-sided atherosclerosis not resulting in significant stenosis. A
1 mm outpouching from the right supraclinoid ICA in the posterior
communicating region is favored with represent an infundibulum. ACAs
and MCAs are patent without evidence of a proximal branch occlusion
or significant proximal stenosis.

Posterior circulation: The intracranial vertebral arteries are
patent to the basilar with the right being dominant. The basilar
artery is widely patent. There is a large left posterior
communicating artery with diminutive left P1 segment. Both PCAs are
patent with mild irregularity and up to mild narrowing of both P2
segments. No aneurysm is identified.

Venous sinuses: Patent.

Anatomic variants: Fetal left PCA.

Review of the MIP images confirms the above findings
IMPRESSION: 1. 80% proximal right ICA stenosis.
2. Ulcerated plaque versus web in the distal right common carotid
artery.
3. Severe left vertebral artery origin stenosis.
4. 60% stenosis of the left subclavian artery immediately proximal
to the left vertebral origin.
5. Mild intracranial atherosclerosis without major branch occlusion
or significant proximal stenosis.
6. Aortic Atherosclerosis (WBDAI-922.2) and Emphysema (WBDAI-RSY.7).

## 2022-05-24 ENCOUNTER — Other Ambulatory Visit (HOSPITAL_COMMUNITY): Payer: Self-pay | Admitting: Family Medicine

## 2022-05-24 DIAGNOSIS — R0609 Other forms of dyspnea: Secondary | ICD-10-CM

## 2022-06-10 ENCOUNTER — Ambulatory Visit (HOSPITAL_COMMUNITY)
Admission: RE | Admit: 2022-06-10 | Discharge: 2022-06-10 | Disposition: A | Discharge status: Home / Self Care | Payer: 59 | Source: Ambulatory Visit | Attending: Family Medicine | Admitting: Family Medicine

## 2022-06-10 DIAGNOSIS — R0609 Other forms of dyspnea: Secondary | ICD-10-CM | POA: Insufficient documentation

## 2022-06-10 DIAGNOSIS — E785 Hyperlipidemia, unspecified: Secondary | ICD-10-CM | POA: Insufficient documentation

## 2022-06-10 DIAGNOSIS — F17200 Nicotine dependence, unspecified, uncomplicated: Secondary | ICD-10-CM | POA: Insufficient documentation

## 2022-06-10 DIAGNOSIS — I10 Essential (primary) hypertension: Secondary | ICD-10-CM | POA: Diagnosis not present

## 2022-06-10 DIAGNOSIS — J449 Chronic obstructive pulmonary disease, unspecified: Secondary | ICD-10-CM | POA: Insufficient documentation

## 2022-06-10 LAB — ECHOCARDIOGRAM COMPLETE
Area-P 1/2: 3.56 cm2
S' Lateral: 3 cm

## 2022-06-23 ENCOUNTER — Encounter: Payer: Self-pay | Admitting: Internal Medicine

## 2022-07-29 ENCOUNTER — Other Ambulatory Visit: Payer: Self-pay | Admitting: Vascular Surgery

## 2022-07-29 DIAGNOSIS — I6529 Occlusion and stenosis of unspecified carotid artery: Secondary | ICD-10-CM

## 2022-08-12 ENCOUNTER — Other Ambulatory Visit: Payer: Self-pay | Admitting: *Deleted

## 2022-08-12 DIAGNOSIS — I6529 Occlusion and stenosis of unspecified carotid artery: Secondary | ICD-10-CM

## 2022-08-19 ENCOUNTER — Ambulatory Visit (HOSPITAL_COMMUNITY)
Admission: RE | Admit: 2022-08-19 | Discharge: 2022-08-19 | Disposition: A | Discharge status: Home / Self Care | Payer: 59 | Source: Ambulatory Visit | Attending: Vascular Surgery | Admitting: Vascular Surgery

## 2022-08-19 ENCOUNTER — Ambulatory Visit: Payer: 59 | Admitting: Physician Assistant

## 2022-08-19 VITALS — BP 112/70 | HR 86 | Temp 98.1°F | Ht 69.0 in | Wt 130.5 lb

## 2022-08-19 DIAGNOSIS — I6529 Occlusion and stenosis of unspecified carotid artery: Secondary | ICD-10-CM

## 2022-08-19 DIAGNOSIS — I6523 Occlusion and stenosis of bilateral carotid arteries: Secondary | ICD-10-CM | POA: Diagnosis not present

## 2022-08-19 NOTE — Progress Notes (Signed)
HISTORY AND PHYSICAL     CC:  follow up. Requesting Provider:  Eunice Blase, MD  HPI: This is a 67 y.o. male here for follow up for carotid artery stenosis.  Pt is s/p right TCAR for symptomatic (blurry vision right eye) carotid artery stenosis on 06/28/2021 by Dr. Virl Cagey.  He previously underwent right CEA 05/05/2011 by Dr. Arita Miss for asymptomatic carotid artery stenosis.    Pt was last seen 07/30/2021 and at that time he was doing well without neurological sx.   Pt returns today for follow up.    Pt denies any amaurosis fugax, speech difficulties, weakness, numbness, paralysis or clumsiness or facial droop.  He states that if he is going up stairs, his legs get tired but no claudication.    He states he has been having some dental work with implants.  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   He has been compliant with his plavix/asa/statin.  He is smoking.  He states he quit for 6 months but unfortunately started back.  He really does not have any interest in quitting currently.    The pt is on a statin for cholesterol management.  The pt is on a daily aspirin.   Other AC:  plavix The pt is on CCB for hypertension.   The pt does not have diabetes Tobacco hx:  current  Pt does not have family hx of AAA.  Past Medical History:  Diagnosis Date   CAD in native artery-cath 09/24/15 non obstructive 09/24/2015   Calf pain    Cancer (HCC)    Skin   Carotid artery occlusion    Chest pain    COPD (chronic obstructive pulmonary disease) (HCC)    Dyspnea    Hearing loss    Heart murmur    History of kidney stones    History of right-sided carotid endarterectomy 09/23/2015   Hx of colonic polyp - ssp 06/21/2015   Hypercholesteremia    Hypertension    Lower back pain    Palpitations    Tinnitus    Tobacco use 09/23/2015   Vertigo     Past Surgical History:  Procedure Laterality Date   CARDIAC CATHETERIZATION N/A 09/24/2015   Procedure:  Left Heart Cath and Coronary Angiography;  Surgeon: Burnell Blanks, MD;  Location: [facility];  Service: Cardiovascular;  Laterality: N/A;   CAROTID ENDARTERECTOMY  05/05/11   Right CEA   MELANOMA EXCISION     l eye   SKIN CANCER EXCISION     TRANSCAROTID ARTERY REVASCULARIZATION  Right 06/28/2021   Procedure: RIGHT TRANSCAROTID ARTERY REVASCULARIZATION;  Surgeon: Broadus John, MD;  Location: [facility];  Service: Vascular;  Laterality: Right;    Allergies  Allergen Reactions   Codeine Nausea Only    Current Outpatient Medications  Medication Sig Dispense Refill   albuterol (VENTOLIN HFA) 108 (90 Base) MCG/ACT inhaler INHALE 2 PUFFS BY MOUTH EVERY 6 HOURS INTO THE LUNGS AS NEEDED FOR WHEEZING OR SHORTNESS OF BREATH 8.5 g 11   amLODipine (NORVASC) 5 MG tablet Take 5 mg by mouth daily.     aspirin EC 81 MG tablet Take 1 tablet (81 mg total) by mouth daily. Swallow whole. 30 tablet 11   atorvastatin (LIPITOR) 40 MG tablet Take 1 tablet (40 mg total) by mouth daily. 30 tablet 6   clopidogrel (PLAVIX) 75 MG tablet Take 1 tablet (75 mg total) by mouth daily. 30 tablet  6   ipratropium-albuterol (DUONEB) 0.5-2.5 (3) MG/3ML SOLN Take 3 mLs by nebulization every 6 (six) hours as needed (Shortness of breath or wheezing). 360 mL 0   oxyCODONE-acetaminophen (PERCOCET/ROXICET) 5-325 MG tablet Take 1 tablet by mouth every 6 (six) hours as needed for moderate pain. 15 tablet 0   Polyethyl Glycol-Propyl Glycol (SYSTANE OP) Place 1 drop into both eyes daily as needed (dry eyes).     Respiratory Therapy Supplies (NEBULIZER) DEVI 1 Device by Does not apply route as needed. 1 each 0   sildenafil (VIAGRA) 50 MG tablet Take 1 tablet (50 mg total) by mouth daily as needed for erectile dysfunction. 30 tablet 6   No current facility-administered medications for this visit.    Family History  Problem Relation Age of Onset   Heart disease Mother    Cancer Father    Heart attack Father         s/p CABG   Diabetes Brother    Colon cancer Neg Hx    Esophageal cancer Neg Hx    Ulcerative colitis Neg Hx    Stomach cancer Neg Hx    Rectal cancer Neg Hx     Social History   Socioeconomic History   Marital status: Single    Spouse name: Not on file   Number of children: Not on file   Years of education: Not on file   Highest education level: Not on file  Occupational History   Not on file  Tobacco Use   Smoking status: Every Day    Packs/day: 2.00    Years: 30.00    Total pack years: 60.00    Types: Cigarettes   Smokeless tobacco: Never   Tobacco comments:    2 ppd for 45 years (10/12/2018)  Vaping Use   Vaping Use: Never used  Substance and Sexual Activity   Alcohol use: No    Alcohol/week: 0.0 standard drinks of alcohol   Drug use: No   Sexual activity: Not on file  Other Topics Concern   Not on file  Social History Narrative   Not on file   Social Determinants of Health   Financial Resource Strain: Not on file  Food Insecurity: Not on file  Transportation Needs: Not on file  Physical Activity: Not on file  Stress: Not on file  Social Connections: Not on file  Intimate Partner Violence: Not on file     REVIEW OF SYSTEMS:   '[X]'$  denotes positive finding, '[ ]'$  denotes negative finding Cardiac  Comments:  Chest pain or chest pressure:    Shortness of breath upon exertion:    Short of breath when lying flat:    Irregular heart rhythm:        Vascular    Pain in calf, thigh, or hip brought on by ambulation:    Pain in feet at night that wakes you up from your sleep:     Blood clot in your veins:    Leg swelling:         Pulmonary    Oxygen at home:    Productive cough:     Wheezing:         Neurologic    Sudden weakness in arms or legs:     Sudden numbness in arms or legs:     Sudden onset of difficulty speaking or slurred speech:    Temporary loss of vision in one eye:     Problems with dizziness:  Gastrointestinal    Blood in  stool:     Vomited blood:         Genitourinary    Burning when urinating:     Blood in urine:        Psychiatric    Major depression:         Hematologic    Bleeding problems:    Problems with blood clotting too easily:        Skin    Rashes or ulcers:        Constitutional    Fever or chills:      PHYSICAL EXAMINATION:  Today's Vitals   08/19/22 0956 08/19/22 0959  BP: 124/73 112/70  Pulse: 86   Temp: 98.1 F (36.7 C)   Weight: 130 lb 8 oz (59.2 kg)   Height: '5\' 9"'$  (1.753 m)   PainSc: 0-No pain    Body mass index is 19.27 kg/m.   General:  WDWN in NAD; vital signs documented above Gait: Not observed HENT: WNL, normocephalic Pulmonary: normal non-labored breathing Cardiac: regular HR, without carotid bruits Abdomen: soft, NT; aortic pulse is not palpable Skin: without rashes Vascular Exam/Pulses:  Right Left  Radial 2+ (normal) 2+ (normal)  PT 2+ (normal) 2+ (normal)   Extremities: without open wounds Musculoskeletal: no muscle wasting or atrophy  Neurologic: A&O X 3; moving all extremities equally; speech is fluent/normal Psychiatric:  The pt has Normal affect.   Non-Invasive Vascular Imaging:   Carotid Duplex on 08/19/2022 Right:  patent stent Left:  40-59% ICA stenosis Vertebrals:  Right vertebral artery demonstrates antegrade flow. Left vertebral artery demonstrates no discernable flow.  Subclavians: Normal flow hemodynamics were seen in bilateral subclavian arteries.   Previous Carotid duplex on 07/30/2021: Right: patent stent Left:   40-59% ICA stenosis    ASSESSMENT/PLAN:: 67 y.o. male here for follow up carotid artery stenosis and hx of right TCAR for symptomatic (blurry vision right eye) carotid artery stenosis on 06/28/2021 by Dr. Virl Cagey.  He previously underwent right CEA 05/05/2011 by Dr. Arita Miss for asymptomatic carotid artery stenosis.   -duplex today reveals the right ICA stent is patent and the left is in the 40-59% range.  He remains  asymptomatic.   Discussed that if this increases to the 60-79% range, we would then see him every 6 months and he understands that if it progresses to >80% we would discuss intervention.  -discussed s/s of stroke with pt and he understands should he develop any of these sx, he will go to the nearest ER or call 911. -pt will f/u in one year with carotid duplex -pt will call sooner should he have any issues. -continue statin/asa/plavix  Current smoker -discussed importance of smoking cessation consisting of stroke, heart attack, limb loss, cancers.  He is not currently interested in quitting.    Leontine Locket, [facility] [phone]  Clinic MD:  Donzetta Matters on call MD

## 2023-09-05 ENCOUNTER — Emergency Department (HOSPITAL_BASED_OUTPATIENT_CLINIC_OR_DEPARTMENT_OTHER): Payer: Medicare Other

## 2023-09-05 ENCOUNTER — Encounter (HOSPITAL_BASED_OUTPATIENT_CLINIC_OR_DEPARTMENT_OTHER): Payer: Self-pay | Admitting: Emergency Medicine

## 2023-09-05 ENCOUNTER — Emergency Department (HOSPITAL_BASED_OUTPATIENT_CLINIC_OR_DEPARTMENT_OTHER): Payer: Medicare Other | Admitting: Radiology

## 2023-09-05 ENCOUNTER — Other Ambulatory Visit: Payer: Self-pay

## 2023-09-05 ENCOUNTER — Inpatient Hospital Stay (HOSPITAL_BASED_OUTPATIENT_CLINIC_OR_DEPARTMENT_OTHER)
Admission: EM | Admit: 2023-09-05 | Discharge: 2023-09-12 | DRG: 871 | Disposition: A | Discharge status: Home / Self Care | Payer: Medicare Other | Attending: Internal Medicine | Admitting: Internal Medicine

## 2023-09-05 DIAGNOSIS — I2510 Atherosclerotic heart disease of native coronary artery without angina pectoris: Secondary | ICD-10-CM | POA: Diagnosis present

## 2023-09-05 DIAGNOSIS — Z87442 Personal history of urinary calculi: Secondary | ICD-10-CM

## 2023-09-05 DIAGNOSIS — Z716 Tobacco abuse counseling: Secondary | ICD-10-CM

## 2023-09-05 DIAGNOSIS — Z66 Do not resuscitate: Secondary | ICD-10-CM | POA: Diagnosis not present

## 2023-09-05 DIAGNOSIS — J9690 Respiratory failure, unspecified, unspecified whether with hypoxia or hypercapnia: Secondary | ICD-10-CM | POA: Diagnosis present

## 2023-09-05 DIAGNOSIS — E871 Hypo-osmolality and hyponatremia: Secondary | ICD-10-CM

## 2023-09-05 DIAGNOSIS — Z7982 Long term (current) use of aspirin: Secondary | ICD-10-CM

## 2023-09-05 DIAGNOSIS — F17200 Nicotine dependence, unspecified, uncomplicated: Secondary | ICD-10-CM | POA: Diagnosis present

## 2023-09-05 DIAGNOSIS — F419 Anxiety disorder, unspecified: Secondary | ICD-10-CM | POA: Diagnosis present

## 2023-09-05 DIAGNOSIS — J129 Viral pneumonia, unspecified: Secondary | ICD-10-CM | POA: Diagnosis present

## 2023-09-05 DIAGNOSIS — Z860100 Personal history of colon polyps, unspecified: Secondary | ICD-10-CM

## 2023-09-05 DIAGNOSIS — J1008 Influenza due to other identified influenza virus with other specified pneumonia: Secondary | ICD-10-CM | POA: Diagnosis present

## 2023-09-05 DIAGNOSIS — A419 Sepsis, unspecified organism: Secondary | ICD-10-CM | POA: Diagnosis not present

## 2023-09-05 DIAGNOSIS — E785 Hyperlipidemia, unspecified: Secondary | ICD-10-CM | POA: Diagnosis present

## 2023-09-05 DIAGNOSIS — Z720 Tobacco use: Secondary | ICD-10-CM | POA: Diagnosis present

## 2023-09-05 DIAGNOSIS — J9691 Respiratory failure, unspecified with hypoxia: Secondary | ICD-10-CM | POA: Diagnosis present

## 2023-09-05 DIAGNOSIS — Z1152 Encounter for screening for COVID-19: Secondary | ICD-10-CM

## 2023-09-05 DIAGNOSIS — Z833 Family history of diabetes mellitus: Secondary | ICD-10-CM

## 2023-09-05 DIAGNOSIS — Z85828 Personal history of other malignant neoplasm of skin: Secondary | ICD-10-CM

## 2023-09-05 DIAGNOSIS — T380X5A Adverse effect of glucocorticoids and synthetic analogues, initial encounter: Secondary | ICD-10-CM | POA: Diagnosis present

## 2023-09-05 DIAGNOSIS — I10 Essential (primary) hypertension: Secondary | ICD-10-CM | POA: Diagnosis present

## 2023-09-05 DIAGNOSIS — J441 Chronic obstructive pulmonary disease with (acute) exacerbation: Secondary | ICD-10-CM

## 2023-09-05 DIAGNOSIS — F17210 Nicotine dependence, cigarettes, uncomplicated: Secondary | ICD-10-CM | POA: Diagnosis present

## 2023-09-05 DIAGNOSIS — E861 Hypovolemia: Secondary | ICD-10-CM | POA: Diagnosis present

## 2023-09-05 DIAGNOSIS — J9601 Acute respiratory failure with hypoxia: Secondary | ICD-10-CM

## 2023-09-05 DIAGNOSIS — J101 Influenza due to other identified influenza virus with other respiratory manifestations: Secondary | ICD-10-CM | POA: Insufficient documentation

## 2023-09-05 DIAGNOSIS — R6521 Severe sepsis with septic shock: Secondary | ICD-10-CM | POA: Diagnosis present

## 2023-09-05 DIAGNOSIS — Z7902 Long term (current) use of antithrombotics/antiplatelets: Secondary | ICD-10-CM

## 2023-09-05 DIAGNOSIS — J9621 Acute and chronic respiratory failure with hypoxia: Secondary | ICD-10-CM | POA: Diagnosis present

## 2023-09-05 DIAGNOSIS — E876 Hypokalemia: Secondary | ICD-10-CM | POA: Diagnosis present

## 2023-09-05 DIAGNOSIS — G9341 Metabolic encephalopathy: Secondary | ICD-10-CM | POA: Diagnosis present

## 2023-09-05 DIAGNOSIS — Z8249 Family history of ischemic heart disease and other diseases of the circulatory system: Secondary | ICD-10-CM

## 2023-09-05 DIAGNOSIS — R4182 Altered mental status, unspecified: Secondary | ICD-10-CM

## 2023-09-05 DIAGNOSIS — E8729 Other acidosis: Secondary | ICD-10-CM | POA: Diagnosis present

## 2023-09-05 DIAGNOSIS — Z79899 Other long term (current) drug therapy: Secondary | ICD-10-CM

## 2023-09-05 DIAGNOSIS — R739 Hyperglycemia, unspecified: Secondary | ICD-10-CM | POA: Diagnosis present

## 2023-09-05 DIAGNOSIS — J439 Emphysema, unspecified: Secondary | ICD-10-CM | POA: Diagnosis present

## 2023-09-05 DIAGNOSIS — R0603 Acute respiratory distress: Principal | ICD-10-CM

## 2023-09-05 DIAGNOSIS — E860 Dehydration: Secondary | ICD-10-CM | POA: Diagnosis present

## 2023-09-05 DIAGNOSIS — E7800 Pure hypercholesterolemia, unspecified: Secondary | ICD-10-CM | POA: Diagnosis present

## 2023-09-05 DIAGNOSIS — J9622 Acute and chronic respiratory failure with hypercapnia: Secondary | ICD-10-CM | POA: Diagnosis present

## 2023-09-05 DIAGNOSIS — Z7952 Long term (current) use of systemic steroids: Secondary | ICD-10-CM

## 2023-09-05 DIAGNOSIS — Z85820 Personal history of malignant melanoma of skin: Secondary | ICD-10-CM

## 2023-09-05 LAB — I-STAT VENOUS BLOOD GAS, ED
Acid-base deficit: 2 mmol/L (ref 0.0–2.0)
Bicarbonate: 30.3 mmol/L — ABNORMAL HIGH (ref 20.0–28.0)
Calcium, Ion: 1.18 mmol/L (ref 1.15–1.40)
HCT: 48 % (ref 39.0–52.0)
Hemoglobin: 16.3 g/dL (ref 13.0–17.0)
O2 Saturation: 83 %
Patient temperature: 97.5
Potassium: 3.3 mmol/L — ABNORMAL LOW (ref 3.5–5.1)
Sodium: 121 mmol/L — ABNORMAL LOW (ref 135–145)
TCO2: 33 mmol/L — ABNORMAL HIGH (ref 22–32)
pCO2, Ven: 85.9 mm[Hg] (ref 44–60)
pH, Ven: 7.152 — CL (ref 7.25–7.43)
pO2, Ven: 62 mm[Hg] — ABNORMAL HIGH (ref 32–45)

## 2023-09-05 LAB — I-STAT ARTERIAL BLOOD GAS, ED
Acid-Base Excess: 3 mmol/L — ABNORMAL HIGH (ref 0.0–2.0)
Bicarbonate: 36.6 mmol/L — ABNORMAL HIGH (ref 20.0–28.0)
Calcium, Ion: 1.2 mmol/L (ref 1.15–1.40)
HCT: 45 % (ref 39.0–52.0)
Hemoglobin: 15.3 g/dL (ref 13.0–17.0)
O2 Saturation: 92 %
Patient temperature: 97.5
Potassium: 3.7 mmol/L (ref 3.5–5.1)
Sodium: 123 mmol/L — ABNORMAL LOW (ref 135–145)
TCO2: 40 mmol/L — ABNORMAL HIGH (ref 22–32)
pCO2 arterial: 106.8 mm[Hg] (ref 32–48)
pH, Arterial: 7.14 — CL (ref 7.35–7.45)
pO2, Arterial: 86 mm[Hg] (ref 83–108)

## 2023-09-05 LAB — CBC WITH DIFFERENTIAL/PLATELET
Abs Immature Granulocytes: 0.08 10*3/uL — ABNORMAL HIGH (ref 0.00–0.07)
Basophils Absolute: 0 10*3/uL (ref 0.0–0.1)
Basophils Relative: 0 %
Eosinophils Absolute: 0 10*3/uL (ref 0.0–0.5)
Eosinophils Relative: 0 %
HCT: 46.4 % (ref 39.0–52.0)
Hemoglobin: 15.1 g/dL (ref 13.0–17.0)
Immature Granulocytes: 1 %
Lymphocytes Relative: 3 %
Lymphs Abs: 0.4 10*3/uL — ABNORMAL LOW (ref 0.7–4.0)
MCH: 29.3 pg (ref 26.0–34.0)
MCHC: 32.5 g/dL (ref 30.0–36.0)
MCV: 90.1 fL (ref 80.0–100.0)
Monocytes Absolute: 2.1 10*3/uL — ABNORMAL HIGH (ref 0.1–1.0)
Monocytes Relative: 20 %
Neutro Abs: 8.3 10*3/uL — ABNORMAL HIGH (ref 1.7–7.7)
Neutrophils Relative %: 76 %
Platelets: 332 10*3/uL (ref 150–400)
RBC: 5.15 MIL/uL (ref 4.22–5.81)
RDW: 12.1 % (ref 11.5–15.5)
WBC: 10.9 10*3/uL — ABNORMAL HIGH (ref 4.0–10.5)
nRBC: 0 % (ref 0.0–0.2)

## 2023-09-05 LAB — RESP PANEL BY RT-PCR (RSV, FLU A&B, COVID)  RVPGX2
Influenza A by PCR: NEGATIVE
Influenza B by PCR: NEGATIVE
Resp Syncytial Virus by PCR: NEGATIVE
SARS Coronavirus 2 by RT PCR: NEGATIVE

## 2023-09-05 LAB — COMPREHENSIVE METABOLIC PANEL
ALT: 26 U/L (ref 0–44)
AST: 40 U/L (ref 15–41)
Albumin: 4 g/dL (ref 3.5–5.0)
Alkaline Phosphatase: 96 U/L (ref 38–126)
Anion gap: 14 (ref 5–15)
BUN: 14 mg/dL (ref 8–23)
CO2: 28 mmol/L (ref 22–32)
Calcium: 9.2 mg/dL (ref 8.9–10.3)
Chloride: 79 mmol/L — ABNORMAL LOW (ref 98–111)
Creatinine, Ser: 0.74 mg/dL (ref 0.61–1.24)
GFR, Estimated: >60 mL/min (ref >60)
Glucose, Bld: 284 mg/dL — ABNORMAL HIGH (ref 70–99)
Potassium: 3.5 mmol/L (ref 3.5–5.1)
Sodium: 121 mmol/L — ABNORMAL LOW (ref 135–145)
Total Bilirubin: 0.9 mg/dL (ref 0.0–1.2)
Total Protein: 7 g/dL (ref 6.5–8.1)

## 2023-09-05 LAB — CBG MONITORING, ED: Glucose-Capillary: 275 mg/dL — ABNORMAL HIGH (ref 70–99)

## 2023-09-05 LAB — TROPONIN I (HIGH SENSITIVITY): Troponin I (High Sensitivity): 11 ng/L (ref <18)

## 2023-09-05 LAB — LACTIC ACID, PLASMA: Lactic Acid, Venous: 4.8 mmol/L (ref 0.5–1.9)

## 2023-09-05 LAB — MAGNESIUM: Magnesium: 1.9 mg/dL (ref 1.7–2.4)

## 2023-09-05 MED ORDER — OSELTAMIVIR PHOSPHATE 6 MG/ML PO SUSR
75.0000 mg | Freq: Two times a day (BID) | ORAL | Status: DC
  Administered 2023-09-06 – 2023-09-07 (×3): 75 mg
  Filled 2023-09-05 (×5): qty 12.5

## 2023-09-05 MED ORDER — ETOMIDATE 2 MG/ML IV SOLN
INTRAVENOUS | Status: AC
  Filled 2023-09-05: qty 20

## 2023-09-05 MED ORDER — ENOXAPARIN SODIUM 40 MG/0.4ML IJ SOSY: 40.0000 mg | PREFILLED_SYRINGE | INTRAMUSCULAR | Status: DC

## 2023-09-05 MED ORDER — PROPOFOL 1000 MG/100ML IV EMUL
5.0000 ug/kg/min | INTRAVENOUS | Status: DC
  Administered 2023-09-05: 15 ug/kg/min via INTRAVENOUS
  Administered 2023-09-06: 20 ug/kg/min via INTRAVENOUS
  Administered 2023-09-06: 40 ug/kg/min via INTRAVENOUS
  Administered 2023-09-06: 20 ug/kg/min via INTRAVENOUS
  Administered 2023-09-07: 40 ug/kg/min via INTRAVENOUS
  Filled 2023-09-05 (×2): qty 100

## 2023-09-05 MED ORDER — FENTANYL 2500MCG IN NS 250ML (10MCG/ML) PREMIX INFUSION
25.0000 ug/h | INTRAVENOUS | Status: DC
  Administered 2023-09-05 – 2023-09-06 (×2): 25 ug/h via INTRAVENOUS
  Administered 2023-09-07: 100 ug/h via INTRAVENOUS
  Filled 2023-09-05 (×2): qty 250

## 2023-09-05 MED ORDER — PROPOFOL 1000 MG/100ML IV EMUL
INTRAVENOUS | Status: AC
  Administered 2023-09-05: 5 ug/kg/min via INTRAVENOUS
  Filled 2023-09-05: qty 100

## 2023-09-05 MED ORDER — INSULIN ASPART 100 UNIT/ML IJ SOLN
0.0000 [IU] | INTRAMUSCULAR | Status: DC
  Administered 2023-09-06 (×2): 2 [IU] via SUBCUTANEOUS
  Administered 2023-09-06 – 2023-09-07 (×3): 1 [IU] via SUBCUTANEOUS
  Administered 2023-09-07: 2 [IU] via SUBCUTANEOUS
  Administered 2023-09-07 – 2023-09-08 (×3): 1 [IU] via SUBCUTANEOUS

## 2023-09-05 MED ORDER — NOREPINEPHRINE 4 MG/250ML-% IV SOLN: 0.0000 ug/min | INTRAVENOUS | Status: DC

## 2023-09-05 MED ORDER — FENTANYL CITRATE (PF) 250 MCG/5ML IJ SOLN: 2.0000 ug/kg/h | INTRAVENOUS | Status: DC

## 2023-09-05 MED ORDER — FENTANYL BOLUS VIA INFUSION
25.0000 ug | INTRAVENOUS | Status: DC | PRN
  Administered 2023-09-05: 50 ug via INTRAVENOUS
  Administered 2023-09-05: 100 ug via INTRAVENOUS
  Administered 2023-09-06: 75 ug via INTRAVENOUS
  Administered 2023-09-06: 50 ug via INTRAVENOUS
  Administered 2023-09-07 (×2): 100 ug via INTRAVENOUS
  Filled 2023-09-05: qty 100

## 2023-09-05 MED ORDER — PANTOPRAZOLE SODIUM 40 MG IV SOLR
40.0000 mg | INTRAVENOUS | Status: DC
  Administered 2023-09-06 (×2): 40 mg via INTRAVENOUS
  Filled 2023-09-05 (×2): qty 10

## 2023-09-05 MED ORDER — PROPOFOL 1000 MG/100ML IV EMUL: 5.0000 ug/kg/min | INTRAVENOUS | Status: DC

## 2023-09-05 MED ORDER — LACTATED RINGERS IV BOLUS (SEPSIS)
1000.0000 mL | Freq: Once | INTRAVENOUS | Status: AC
  Administered 2023-09-05: 1000 mL via INTRAVENOUS

## 2023-09-05 MED ORDER — METHYLPREDNISOLONE SODIUM SUCC 40 MG IJ SOLR
40.0000 mg | Freq: Two times a day (BID) | INTRAMUSCULAR | Status: DC
  Administered 2023-09-06 (×2): 40 mg via INTRAVENOUS
  Filled 2023-09-05 (×2): qty 1

## 2023-09-05 MED ORDER — SODIUM CHLORIDE 0.9 % IV SOLN
1.0000 g | INTRAVENOUS | Status: DC
  Administered 2023-09-06: 1 g via INTRAVENOUS
  Filled 2023-09-05: qty 10

## 2023-09-05 MED ORDER — SODIUM CHLORIDE 0.9 % IV SOLN
2.0000 g | Freq: Once | INTRAVENOUS | Status: AC
  Administered 2023-09-05: 2 g via INTRAVENOUS
  Filled 2023-09-05: qty 12.5

## 2023-09-05 MED ORDER — MIDAZOLAM HCL 2 MG/2ML IJ SOLN
5.0000 mg | Freq: Once | INTRAMUSCULAR | Status: AC
Start: 2023-09-05 — End: 2023-09-05
  Administered 2023-09-05: 5 mg via INTRAVENOUS

## 2023-09-05 MED ORDER — METRONIDAZOLE 500 MG/100ML IV SOLN
500.0000 mg | Freq: Once | INTRAVENOUS | Status: AC
  Administered 2023-09-05: 500 mg via INTRAVENOUS
  Filled 2023-09-05: qty 100

## 2023-09-05 MED ORDER — NOREPINEPHRINE 4 MG/250ML-% IV SOLN
INTRAVENOUS | Status: AC
  Administered 2023-09-05: 2 ug/min via INTRAVENOUS
  Filled 2023-09-05: qty 250

## 2023-09-05 MED ORDER — ETOMIDATE 2 MG/ML IV SOLN
INTRAVENOUS | Status: AC | PRN
  Administered 2023-09-05: 20 mg via INTRAVENOUS

## 2023-09-05 MED ORDER — MIDAZOLAM HCL (PF) 10 MG/2ML IJ SOLN
INTRAMUSCULAR | Status: AC
  Filled 2023-09-05: qty 2

## 2023-09-05 MED ORDER — LACTATED RINGERS IV BOLUS
500.0000 mL | Freq: Once | INTRAVENOUS | Status: AC
  Administered 2023-09-05: 500 mL via INTRAVENOUS

## 2023-09-05 MED ORDER — ROCURONIUM BROMIDE 10 MG/ML (PF) SYRINGE
PREFILLED_SYRINGE | INTRAVENOUS | Status: AC
Start: 2023-09-05 — End: 2023-09-06
  Filled 2023-09-05: qty 10

## 2023-09-05 MED ORDER — VANCOMYCIN HCL IN DEXTROSE 1-5 GM/200ML-% IV SOLN
1000.0000 mg | Freq: Once | INTRAVENOUS | Status: AC
  Administered 2023-09-05: 1000 mg via INTRAVENOUS
  Filled 2023-09-05: qty 200

## 2023-09-05 MED ORDER — SUCCINYLCHOLINE CHLORIDE 20 MG/ML IJ SOLN
INTRAMUSCULAR | Status: AC | PRN
  Administered 2023-09-05: 100 mg via INTRAVENOUS

## 2023-09-05 MED ORDER — ALBUTEROL SULFATE (2.5 MG/3ML) 0.083% IN NEBU: 2.5000 mg | INHALATION_SOLUTION | RESPIRATORY_TRACT | Status: DC | PRN

## 2023-09-05 MED ORDER — SUCCINYLCHOLINE CHLORIDE 200 MG/10ML IV SOSY
PREFILLED_SYRINGE | INTRAVENOUS | Status: AC
  Filled 2023-09-05: qty 10

## 2023-09-05 MED ORDER — IPRATROPIUM-ALBUTEROL 0.5-2.5 (3) MG/3ML IN SOLN
3.0000 mL | Freq: Four times a day (QID) | RESPIRATORY_TRACT | Status: DC
  Administered 2023-09-06 – 2023-09-08 (×11): 3 mL via RESPIRATORY_TRACT
  Filled 2023-09-05 (×12): qty 3

## 2023-09-05 MED ORDER — POTASSIUM CHLORIDE 10 MEQ/100ML IV SOLN
10.0000 meq | INTRAVENOUS | Status: DC
  Administered 2023-09-06: 10 meq via INTRAVENOUS
  Filled 2023-09-05: qty 100

## 2023-09-05 MED ORDER — LACTATED RINGERS IV SOLN: INTRAVENOUS | Status: DC

## 2023-09-05 MED ORDER — SODIUM CHLORIDE 0.9 % IV SOLN
500.0000 mg | INTRAVENOUS | Status: AC
  Administered 2023-09-06 – 2023-09-07 (×3): 500 mg via INTRAVENOUS
  Filled 2023-09-05 (×3): qty 5

## 2023-09-05 NOTE — ED Notes (Signed)
EDP Zackowski informed pt moved to [facility],  RSI meds, RT, and 2 Rns at bedside.

## 2023-09-05 NOTE — Sepsis Progress Note (Signed)
Following for sepsis monitoring ?

## 2023-09-05 NOTE — ED Notes (Signed)
RT at bedside to place BIPAP

## 2023-09-05 NOTE — ED Notes (Signed)
Assisted primary RN Victorino Dike with report. Report given to [facility] RN [facility] RM [phone]

## 2023-09-05 NOTE — ED Notes (Signed)
Called [phone] RN Herbert Seta with updated report. [facility] at bedside for transport

## 2023-09-05 NOTE — H&P (Incomplete)
NAME:  Jackson Mathews, MRN:  [id number], DOB:  [birth year], LOS: 0 ADMISSION DATE:  09/05/2023, CONSULTATION DATE:  09/05/23 REFERRING MD:  Deretha Emory CHIEF COMPLAINT:  Respiratory Distress   History of Present Illness:  Pt is encephelopathic; therefore, this HPI is obtained from chart review. Jackson Mathews is a 68 y.o. male who has a PMH as below including but not limited to COPD/emphysema (no PFTs in system but CXR does show emphysema), HTN, HLD, carotid artery occlusion, CAD 2017. He presented to [facility] 2/11 after being diagnosed with influenza A by PCP one day prior when he was started on Tamiflu. On 2/11, he developed worsening symptoms including respiratory distress prompting him to drive to the ED. In ED, he had AMS and was in significant distress to the point he required intubation after failing BiPAP trial (had worsened ABG with worsening hypercapnia).   He was intubated and was later transferred to [facility] for further evaluation.  Head CT was negative. CXR showed possible RUL infiltrate. He was started on Steroids, Ceftriaxone and Azithromycin and had cultures drawn. Of note, Flu/RSV/COVID were negative in ED though he reportedly did test positive for flu A 2/10 at PCP.  Pertinent  Medical History:  has Hearing loss; DYSPNEA; SKIN CANCER, HX OF; Hx of colonic polyp - ssp; Peripheral vascular disease (HCC); Carotid artery disease (HCC); History of right-sided carotid endarterectomy; Nicotine dependence with current use; CAD in native artery-cath 09/24/15 non obstructive; Essential hypertension; HLD (hyperlipidemia); Lower urinary tract symptoms (LUTS); Systolic murmur; Actinic keratosis; Hypoxia; Tobacco use; Bronchitis due to tobacco use; Hyponatremia; Leukocytosis; Carotid artery stenosis, symptomatic, right; and Respiratory failure (HCC) on their problem list.  Significant Hospital Events: Including procedures, antibiotic start and stop dates in addition to other pertinent events   2/11  admit.  Interim History / Subjective:  Sedated.  Objective:  Blood pressure (!) 195/84, pulse (!) 123, temperature (!) 97.5 F (36.4 C), temperature source Oral, resp. rate (!) 24, height 5\' 9"  (1.753 m), weight 59 kg, SpO2 98%.    Vent Mode: BIPAP;PCV FiO2 (%):  [40 %] 40 % Set Rate:  [16 bmp] 16 bmp PEEP:  [6 cmH20] 6 cmH20  No intake or output data in the 24 hours ending 09/05/23 2235 Filed Weights   09/05/23 2211  Weight: 59 kg    Examination: General: ***, resting in bed, in NAD. Neuro: ***. HEENT: [place]/AT. Sclerae anicteric. ***. Cardiovascular: ***, no M/R/G.  Lungs: Respirations even and unlabored.  CTA bilaterally, No W/R/R. *** Abdomen: BS x 4, soft, NT/ND.  Musculoskeletal: No gross deformities, no edema.  Skin: Intact, warm, no rashes. GU: ***  Labs/imaging personally reviewed:  CT head 2/11 > neg. CXR 2/11 > emphysema, possible RUL infiltrate.  Assessment & Plan:   Acute hypoxic and hypercapnic respiratory failure requiring mechanical ventilation - presumed 2/2 AECOPD, now s/p intubation after failing BiPAP. Hx COPD/emphysema (no PFT's on file). Ongoing tobacco dependence. - Continue full vent support. - Daily SBT. - Continue empiric CTX/Azithromcyin. - Continue Solumedrol. - Follow urinary antigens, cultures. - Albuterol, DuoNebs. - Follow CXR intermittently. - Can try to get him established in our office as an outpatient for outpatient follow up. - Tobacco cessation counseling.  Possible Flu A - reportedly tested positive at PCP 2/10 but negative in ED 2/11. - Continue empiric Tamiflu and airborne precautions for now.  Hx HTN, HLD, CAD, carotid artery occlusion s/p right CAE 2017. - Hold PTA Amlodipine. - Continue PTA ASA, Clopidogrel, Atorvastatin.  Hyponatremia. -  Gentle fluids. - Follow BMP, urine Na, urine osmoles.  Hyperglycemia. - SSI. - Hgb A1c.    Best practice (evaluated daily):  Diet/type: NPO DVT prophylaxis: prophylactic  heparin  Pressure ulcer(s): pressure ulcer assessment deferred  GI prophylaxis: PPI Lines: N/A Foley:  N/A Code Status:  full code Last date of multidisciplinary goals of care discussion: None yet.  Labs   CBC: Recent Labs  Lab 09/05/23 1915 09/05/23 1933 09/05/23 2141  WBC 10.9*  --   --   NEUTROABS 8.3*  --   --   HGB 15.1 16.3 15.3  HCT 46.4 48.0 45.0  MCV 90.1  --   --   PLT 332  --   --     Basic Metabolic Panel: Recent Labs  Lab 09/05/23 1915 09/05/23 1933 09/05/23 2141  NA 121* 121* 123*  K 3.5 3.3* 3.7  CL 79*  --   --   CO2 28  --   --   GLUCOSE 284*  --   --   BUN 14  --   --   CREATININE 0.74  --   --   CALCIUM 9.2  --   --    GFR: Estimated Creatinine Clearance: 75.8 mL/min (by C-G formula based on SCr of 0.74 mg/dL). Recent Labs  Lab 09/05/23 1915 09/05/23 1931  WBC 10.9*  --   LATICACIDVEN  --  4.8*    Liver Function Tests: Recent Labs  Lab 09/05/23 1915  AST 40  ALT 26  ALKPHOS 96  BILITOT 0.9  PROT 7.0  ALBUMIN 4.0   No results for input(s): "LIPASE", "AMYLASE" in the last 168 hours. No results for input(s): "AMMONIA" in the last 168 hours.  ABG    Component Value Date/Time   PHART 7.140 (LL) 09/05/2023 2141   PCO2ART 106.8 (HH) 09/05/2023 2141   PO2ART 86 09/05/2023 2141   HCO3 36.6 (H) 09/05/2023 2141   TCO2 40 (H) 09/05/2023 2141   ACIDBASEDEF 2.0 09/05/2023 1933   O2SAT 92 09/05/2023 2141     Coagulation Profile: No results for input(s): "INR", "PROTIME" in the last 168 hours.  Cardiac Enzymes: No results for input(s): "CKTOTAL", "CKMB", "CKMBINDEX", "TROPONINI" in the last 168 hours.  HbA1C: Hgb A1c MFr Bld  Date/Time Value Ref Range Status  09/24/2015 04:00 AM 5.6 4.8 - 5.6 % Final    Comment:    (NOTE)         Pre-diabetes: 5.7 - 6.4         Diabetes: >6.4         Glycemic control for adults with diabetes: <7.0     CBG: Recent Labs  Lab 09/05/23 1907  GLUCAP 275*    Review of Systems:   Unable  to obtain as pt is encephalopathic.  Past Medical History:  He,  has a past medical history of CAD in native artery-cath 09/24/15 non obstructive (09/24/2015), Calf pain, Cancer (HCC), Carotid artery occlusion, Chest pain, COPD (chronic obstructive pulmonary disease) (HCC), Dyspnea, Hearing loss, Heart murmur, History of kidney stones, History of right-sided carotid endarterectomy (09/23/2015), Hx of colonic polyp - ssp (06/21/2015), Hypercholesteremia, Hypertension, Hypoxia (10/12/2018), Lower back pain, Palpitations, Tinnitus, Tobacco use (09/23/2015), and Vertigo.   Surgical History:   Past Surgical History:  Procedure Laterality Date   CARDIAC CATHETERIZATION N/A 09/24/2015   Procedure: Left Heart Cath and Coronary Angiography;  Surgeon: Kathleene Hazel, MD;  Location: [facility] INVASIVE CV LAB;  Service: Cardiovascular;  Laterality: N/A;   CAROTID ENDARTERECTOMY  05/05/11   Right CEA   MELANOMA EXCISION     l eye   SKIN CANCER EXCISION     TRANSCAROTID ARTERY REVASCULARIZATION  Right 06/28/2021   Procedure: RIGHT TRANSCAROTID ARTERY REVASCULARIZATION;  Surgeon: Victorino Sparrow, MD;  Location: [facility] OR;  Service: Vascular;  Laterality: Right;     Social History:   reports that he has been smoking cigarettes. He has a 60 pack-year smoking history. He has never used smokeless tobacco. He reports that he does not drink alcohol and does not use drugs.   Family History:  His family history includes Cancer in his father; Diabetes in his brother; Heart attack in his father; Heart disease in his mother. There is no history of Colon cancer, Esophageal cancer, Ulcerative colitis, Stomach cancer, or Rectal cancer.   Allergies Allergies  Allergen Reactions   Codeine Nausea Only     Home Medications  Prior to Admission medications   Medication Sig Start Date End Date Taking? Authorizing Provider  albuterol (VENTOLIN HFA) 108 (90 Base) MCG/ACT inhaler INHALE 2 PUFFS BY MOUTH EVERY 6 HOURS INTO THE  LUNGS AS NEEDED FOR WHEEZING OR SHORTNESS OF BREATH 12/22/20   Hilts, Casimiro Needle, MD  amLODipine (NORVASC) 5 MG tablet Take 5 mg by mouth daily.    [provider]  aspirin EC 81 MG tablet Take 1 tablet (81 mg total) by mouth daily. Swallow whole. 06/12/21   Victorino Sparrow, MD  atorvastatin (LIPITOR) 40 MG tablet Take 1 tablet (40 mg total) by mouth daily. 06/11/21   Setzer, Lynnell Jude, PA-C  clopidogrel (PLAVIX) 75 MG tablet Take 1 tablet (75 mg total) by mouth daily. 06/11/21   Setzer, Lynnell Jude, PA-C  ipratropium-albuterol (DUONEB) 0.5-2.5 (3) MG/3ML SOLN Take 3 mLs by nebulization every 6 (six) hours as needed (Shortness of breath or wheezing). 10/14/18   Clydie Braun, MD  oxyCODONE-acetaminophen (PERCOCET/ROXICET) 5-325 MG tablet Take 1 tablet by mouth every 6 (six) hours as needed for moderate pain. Patient not taking: Reported on 08/19/2022 06/29/21   Graceann Congress, PA-C  Polyethyl Glycol-Propyl Glycol (SYSTANE OP) Place 1 drop into both eyes daily as needed (dry eyes).    [provider]  Respiratory Therapy Supplies (NEBULIZER) DEVI 1 Device by Does not apply route as needed. 10/14/18   Clydie Braun, MD  sildenafil (VIAGRA) 50 MG tablet Take 1 tablet (50 mg total) by mouth daily as needed for erectile dysfunction. 05/11/20   Hilts, Casimiro Needle, MD     Critical care time: ***

## 2023-09-05 NOTE — ED Notes (Signed)
ED Provider at bedside.

## 2023-09-05 NOTE — ED Notes (Signed)
EDP Zackowski updated regarding BP trend. BP remains low despite stopping propofol and LR bolus (approx 250 ml given) EDP verbal order to start Levo

## 2023-09-05 NOTE — ED Triage Notes (Signed)
Arrived in ed for chestpain Drove self Not able to answer  Confusion

## 2023-09-05 NOTE — ED Provider Notes (Addendum)
[place] EMERGENCY DEPARTMENT AT [facility] Provider Note   CSN: [id number] Arrival date & time: 09/05/23  1856     History  Chief Complaint  Patient presents with   Chest Pain    Jackson Mathews is a 68 y.o. male.  Patient parent Pedro Earls drove himself here.  Was diagnosed with influenza a by his primary care doctor yesterday started on Tamiflu.  Shortly after arrival here patient is able to nod that he drove himself.  Brought back immediately for evaluation.  Patient would not really follow commands.  But was awake.  Would spontaneously move his upper extremities and lower extremities.  Patient clearly in respiratory distress.  Patient with a known history of COPD not on oxygen at home.  Patient on DuoNebs.  Patient also on Plavix.  On Norvasc.  Patient past history sniffer carotid artery occlusion with recent revascularization of that hypertension high cholesterol tobacco abuse in the past.  Coronary disease native artery cath 2017 nonobstructive.  History of heart murmur COPD history of kidney stones.  Patient is an everyday smoker.       Home Medications Prior to Admission medications   Medication Sig Start Date End Date Taking? Authorizing Provider  albuterol (VENTOLIN HFA) 108 (90 Base) MCG/ACT inhaler INHALE 2 PUFFS BY MOUTH EVERY 6 HOURS INTO THE LUNGS AS NEEDED FOR WHEEZING OR SHORTNESS OF BREATH 12/22/20   Hilts, Casimiro Needle, MD  amLODipine (NORVASC) 5 MG tablet Take 5 mg by mouth daily.    [provider]  aspirin EC 81 MG tablet Take 1 tablet (81 mg total) by mouth daily. Swallow whole. 06/12/21   Victorino Sparrow, MD  atorvastatin (LIPITOR) 40 MG tablet Take 1 tablet (40 mg total) by mouth daily. 06/11/21   Setzer, Lynnell Jude, PA-C  clopidogrel (PLAVIX) 75 MG tablet Take 1 tablet (75 mg total) by mouth daily. 06/11/21   Setzer, Lynnell Jude, PA-C  ipratropium-albuterol (DUONEB) 0.5-2.5 (3) MG/3ML SOLN Take 3 mLs by nebulization every 6 (six) hours as needed  (Shortness of breath or wheezing). 10/14/18   Clydie Braun, MD  oxyCODONE-acetaminophen (PERCOCET/ROXICET) 5-325 MG tablet Take 1 tablet by mouth every 6 (six) hours as needed for moderate pain. Patient not taking: Reported on 08/19/2022 06/29/21   Graceann Congress, PA-C  Polyethyl Glycol-Propyl Glycol (SYSTANE OP) Place 1 drop into both eyes daily as needed (dry eyes).    [provider]  Respiratory Therapy Supplies (NEBULIZER) DEVI 1 Device by Does not apply route as needed. 10/14/18   Clydie Braun, MD  sildenafil (VIAGRA) 50 MG tablet Take 1 tablet (50 mg total) by mouth daily as needed for erectile dysfunction. 05/11/20   Hilts, Casimiro Needle, MD      Allergies    Codeine    Review of Systems   Review of Systems  Unable to perform ROS: Mental status change    Physical Exam Updated Vital Signs BP (!) 212/93   Pulse (!) 110   Temp (!) 97.5 F (36.4 C) (Oral)   Resp (!) 23   Ht 1.753 m (5\' 9" )   SpO2 99%   BMI 19.27 kg/m  Physical Exam Constitutional:      General: He is in acute distress.     Appearance: He is toxic-appearing.  HENT:     Mouth/Throat:     Mouth: Mucous membranes are dry.  Eyes:     Extraocular Movements: Extraocular movements intact.     Pupils: Pupils are equal, round, and reactive to  light.  Pulmonary:     Effort: Respiratory distress present.     Breath sounds: Rhonchi and rales present. No wheezing.     Comments: Decreased breath sounds on the left Abdominal:     General: There is no distension.     Tenderness: There is no abdominal tenderness.  Musculoskeletal:     Cervical back: Normal range of motion. No rigidity.     Right lower leg: No edema.     Left lower leg: No edema.  Skin:    Capillary Refill: Capillary refill takes less than 2 seconds.     Findings: No rash.  Neurological:     Comments: Patient awake but will not really follow commands and nonverbal.     ED Results / Procedures / Treatments   Labs (all labs ordered  are listed, but only abnormal results are displayed) Labs Reviewed  COMPREHENSIVE METABOLIC PANEL - Abnormal; Notable for the following components:      Result Value   Sodium 121 (*)    Chloride 79 (*)    Glucose, Bld 284 (*)    All other components within normal limits  CBC WITH DIFFERENTIAL/PLATELET - Abnormal; Notable for the following components:   WBC 10.9 (*)    Neutro Abs 8.3 (*)    Lymphs Abs 0.4 (*)    Monocytes Absolute 2.1 (*)    Abs Immature Granulocytes 0.08 (*)    All other components within normal limits  LACTIC ACID, PLASMA - Abnormal; Notable for the following components:   Lactic Acid, Venous 4.8 (*)    All other components within normal limits  CBG MONITORING, ED - Abnormal; Notable for the following components:   Glucose-Capillary 275 (*)    All other components within normal limits  I-STAT VENOUS BLOOD GAS, ED - Abnormal; Notable for the following components:   pH, Ven 7.152 (*)    pCO2, Ven 85.9 (*)    pO2, Ven 62 (*)    Bicarbonate 30.3 (*)    TCO2 33 (*)    Sodium 121 (*)    Potassium 3.3 (*)    All other components within normal limits  I-STAT ARTERIAL BLOOD GAS, ED - Abnormal; Notable for the following components:   pH, Arterial 7.140 (*)    pCO2 arterial 106.8 (*)    Bicarbonate 36.6 (*)    TCO2 40 (*)    Acid-Base Excess 3.0 (*)    Sodium 123 (*)    All other components within normal limits  RESP PANEL BY RT-PCR (RSV, FLU A&B, COVID)  RVPGX2  CULTURE, BLOOD (ROUTINE X 2)  CULTURE, BLOOD (ROUTINE X 2)  LACTIC ACID, PLASMA  TROPONIN I (HIGH SENSITIVITY)  TROPONIN I (HIGH SENSITIVITY)    EKG EKG Interpretation Date/Time:  Tuesday September 05 2023 19:07:15 EST Ventricular Rate:  102 PR Interval:  136 QRS Duration:  92 QT Interval:  313 QTC Calculation: 408 R Axis:   82  Text Interpretation: Sinus tachycardia Atrial premature complexes Biatrial enlargement Anterior infarct, old Repol abnrm, severe global ischemia (LM/MVD) Artifact in  lead(s) I II III aVF V5 V6 Confirmed by Vanetta Mulders [id number]) on 09/05/2023 7:22:21 PM  Radiology CT Head Wo Contrast Result Date: 09/05/2023 CLINICAL DATA:  Mental status change, unknown cause EXAM: CT HEAD WITHOUT CONTRAST TECHNIQUE: Contiguous axial images were obtained from the base of the skull through the vertex without intravenous contrast. RADIATION DOSE REDUCTION: This exam was performed according to the departmental dose-optimization program which includes automated exposure control, adjustment  of the mA and/or kV according to patient size and/or use of iterative reconstruction technique. COMPARISON:  CTA head/neck June 14, 2021 FINDINGS: Mildly motion limited study. Brain: No evidence of acute infarction, hemorrhage, hydrocephalus, extra-axial collection or mass lesion/mass effect. Vascular: No hyperdense vessel identified. Calcific atherosclerosis. Skull: No acute fracture. Sinuses/Orbits: Clear sinuses.  No acute orbital findings. Other: No mastoid effusions. IMPRESSION: Stable head CT.  No evidence of acute intracranial abnormality. Electronically Signed   By: Feliberto Harts M.D.   On: 09/05/2023 21:33   DG Chest Portable 1 View Result Date: 09/05/2023 CLINICAL DATA:  Shortness of breath EXAM: PORTABLE CHEST 1 VIEW COMPARISON:  10/12/2018 FINDINGS: Hyperinflation and emphysema. Mild diffuse increased interstitial opacity suggesting acute on chronic bronchitic changes. Possible developing infiltrate in the right upper lobe. No pleural effusion or pneumothorax. Normal cardiac size IMPRESSION: Hyperinflation and emphysema with mild diffuse increased interstitial opacity suggesting acute on chronic bronchitic changes. Possible developing infiltrate in the right upper lobe. Radiographic follow-up to resolution is recommended Electronically Signed   By: Jasmine Pang M.D.   On: 09/05/2023 20:08    Procedures Procedure Name: Intubation Date/Time: 09/05/2023 10:16 PM  Performed by:  Vanetta Mulders, MDPre-anesthesia Checklist: Patient identified, Emergency Drugs available, Suction available, Timeout performed and Patient being monitored Oxygen Delivery Method: Ambu bag Preoxygenation: Pre-oxygenation with 100% oxygen Induction Type: Rapid sequence Laryngoscope Size: 3 and Glidescope Tube size: 7.5 mm Number of attempts: 1 Airway Equipment and Method: Rigid stylet Secured at: 25 cm Tube secured with: ETT holder Comments: Intubated without any difficulty.        Medications Ordered in ED Medications  lactated ringers infusion (has no administration in time range)  metroNIDAZOLE (FLAGYL) IVPB 500 mg (500 mg Intravenous New Bag/Given 09/05/23 2135)  vancomycin (VANCOCIN) IVPB 1000 mg/200 mL premix (has no administration in time range)  etomidate (AMIDATE) 2 MG/ML injection (has no administration in time range)  succinylcholine (ANECTINE) 200 MG/10ML syringe (has no administration in time range)  rocuronium (ZEMURON) 100 MG/10ML injection (has no administration in time range)  ceFEPIme (MAXIPIME) 2 g in sodium chloride 0.9 % 100 mL IVPB (2 g Intravenous New Bag/Given 09/05/23 2032)  lactated ringers bolus 1,000 mL (1,000 mLs Intravenous New Bag/Given 09/05/23 2026)    And  lactated ringers bolus 1,000 mL (1,000 mLs Intravenous New Bag/Given 09/05/23 2022)    ED Course/ Medical Decision Making/ A&P                                 Medical Decision Making Amount and/or Complexity of Data Reviewed Labs: ordered. Radiology: ordered.  Risk Prescription drug management. Decision regarding hospitalization.   CRITICAL CARE Performed by: Vanetta Mulders Total critical care time: 60 minutes Critical care time was exclusive of separately billable procedures and treating other patients. Critical care was necessary to treat or prevent imminent or life-threatening deterioration. Critical care was time spent personally by me on the following activities: development of  treatment plan with patient and/or surrogate as well as nursing, discussions with consultants, evaluation of patient's response to treatment, examination of patient, obtaining history from patient or surrogate, ordering and performing treatments and interventions, ordering and review of laboratory studies, ordering and review of radiographic studies, pulse oximetry and re-evaluation of patient's condition.   Patient arrived here in no significant respiratory distress.  Also with some altered mental status.  Patient's initial venous blood gas had a pH of 7.14 with  a pCO2 of 88 and an pO2 of 64.  Patient started on BiPAP for this.  Did seem to improve a little bit.  Where he started to follow some commands.  But still remained nonverbal still appeared to be a little confused seem to be typing on his cell phone that was not in his hands.  Repeated blood gas after he been on BiPAP for a little while to see if there was evidence of improvement.  This was an arterial blood gas and the pH was 7.13 pCO2 was 109.7 pO2 was 90.  So significantly worse.  Based on this patient will require intubation and will be ICU admission.  In addition patient's lactic acid was fairly high.  Questionable upper lobe pneumonia on the chest x-ray.  Patient was started on sepsis protocol 2 L of fluids broad-spectrum antibiotics.  Patient was never hypotensive.  Was actually hypertensive.  Patient diagnosed with influenza A yesterday but our respiratory panel here today is negative.  Patient was started on Tamiflu by his primary care doctor.  Blood cultures pending.  Complete metabolic panel sodium was 121 potassium 3.5 chloride 79 glucose was 283.  LFTs are normal renal function was normal.  CBC white count 10.9 hemoglobin 15.1 platelets 332.  CT head was without any acute findings.  Portable chest x-ray as mentioned at hyperinflation emphysema diffuse increased interstitial opacity suggesting acute on chronic bronchitic changes  possible developing infiltrate in right upper lobe.  Discussed with on-call critical care Dr.Albustami, who is excepted the patient.  Waiting to see if there is a bed available.  If there is no bed will send to ED to ED to [facility].  Just notified beds available.  Admit orders completed.   Postintubation patient started on propofol drip and fentanyl drip.  For sedation.  Final Clinical Impression(s) / ED Diagnoses Final diagnoses:  Respiratory distress  Altered mental status, unspecified altered mental status type  Acute respiratory failure with hypoxia and hypercapnia (HCC)  Sepsis, due to unspecified organism, unspecified whether acute organ dysfunction present [facility])  Hyponatremia    Rx / DC Orders ED Discharge Orders          Ordered    Bipap        09/05/23 1939              Vanetta Mulders, MD 09/05/23 2204    Vanetta Mulders, MD 09/05/23 1610    Vanetta Mulders, MD 09/05/23 2234

## 2023-09-05 NOTE — Sepsis Progress Note (Signed)
Patient en route to [facility]. Will need repeat lactic upon arrival.

## 2023-09-06 ENCOUNTER — Inpatient Hospital Stay (HOSPITAL_COMMUNITY): Payer: Medicare Other

## 2023-09-06 DIAGNOSIS — J1008 Influenza due to other identified influenza virus with other specified pneumonia: Secondary | ICD-10-CM | POA: Diagnosis present

## 2023-09-06 DIAGNOSIS — J439 Emphysema, unspecified: Secondary | ICD-10-CM | POA: Diagnosis present

## 2023-09-06 DIAGNOSIS — E876 Hypokalemia: Secondary | ICD-10-CM | POA: Diagnosis present

## 2023-09-06 DIAGNOSIS — R579 Shock, unspecified: Secondary | ICD-10-CM | POA: Diagnosis not present

## 2023-09-06 DIAGNOSIS — Z833 Family history of diabetes mellitus: Secondary | ICD-10-CM | POA: Diagnosis not present

## 2023-09-06 DIAGNOSIS — Z85828 Personal history of other malignant neoplasm of skin: Secondary | ICD-10-CM | POA: Diagnosis not present

## 2023-09-06 DIAGNOSIS — Z66 Do not resuscitate: Secondary | ICD-10-CM | POA: Diagnosis not present

## 2023-09-06 DIAGNOSIS — J9690 Respiratory failure, unspecified, unspecified whether with hypoxia or hypercapnia: Secondary | ICD-10-CM | POA: Diagnosis present

## 2023-09-06 DIAGNOSIS — Z8249 Family history of ischemic heart disease and other diseases of the circulatory system: Secondary | ICD-10-CM | POA: Diagnosis not present

## 2023-09-06 DIAGNOSIS — I10 Essential (primary) hypertension: Secondary | ICD-10-CM

## 2023-09-06 DIAGNOSIS — E871 Hypo-osmolality and hyponatremia: Secondary | ICD-10-CM | POA: Diagnosis present

## 2023-09-06 DIAGNOSIS — R578 Other shock: Secondary | ICD-10-CM | POA: Diagnosis not present

## 2023-09-06 DIAGNOSIS — J09X1 Influenza due to identified novel influenza A virus with pneumonia: Secondary | ICD-10-CM

## 2023-09-06 DIAGNOSIS — Z1152 Encounter for screening for COVID-19: Secondary | ICD-10-CM | POA: Diagnosis not present

## 2023-09-06 DIAGNOSIS — Z85820 Personal history of malignant melanoma of skin: Secondary | ICD-10-CM | POA: Diagnosis not present

## 2023-09-06 DIAGNOSIS — I2510 Atherosclerotic heart disease of native coronary artery without angina pectoris: Secondary | ICD-10-CM | POA: Diagnosis present

## 2023-09-06 DIAGNOSIS — T380X5A Adverse effect of glucocorticoids and synthetic analogues, initial encounter: Secondary | ICD-10-CM | POA: Diagnosis present

## 2023-09-06 DIAGNOSIS — E7800 Pure hypercholesterolemia, unspecified: Secondary | ICD-10-CM | POA: Diagnosis present

## 2023-09-06 DIAGNOSIS — J129 Viral pneumonia, unspecified: Secondary | ICD-10-CM | POA: Diagnosis present

## 2023-09-06 DIAGNOSIS — J441 Chronic obstructive pulmonary disease with (acute) exacerbation: Secondary | ICD-10-CM | POA: Diagnosis present

## 2023-09-06 DIAGNOSIS — E8729 Other acidosis: Secondary | ICD-10-CM | POA: Diagnosis present

## 2023-09-06 DIAGNOSIS — J9622 Acute and chronic respiratory failure with hypercapnia: Secondary | ICD-10-CM | POA: Diagnosis present

## 2023-09-06 DIAGNOSIS — G9341 Metabolic encephalopathy: Secondary | ICD-10-CM | POA: Diagnosis present

## 2023-09-06 DIAGNOSIS — J9691 Respiratory failure, unspecified with hypoxia: Secondary | ICD-10-CM | POA: Diagnosis present

## 2023-09-06 DIAGNOSIS — J9601 Acute respiratory failure with hypoxia: Secondary | ICD-10-CM | POA: Diagnosis not present

## 2023-09-06 DIAGNOSIS — E785 Hyperlipidemia, unspecified: Secondary | ICD-10-CM

## 2023-09-06 DIAGNOSIS — A419 Sepsis, unspecified organism: Secondary | ICD-10-CM | POA: Diagnosis present

## 2023-09-06 DIAGNOSIS — J9621 Acute and chronic respiratory failure with hypoxia: Secondary | ICD-10-CM | POA: Diagnosis present

## 2023-09-06 DIAGNOSIS — J101 Influenza due to other identified influenza virus with other respiratory manifestations: Secondary | ICD-10-CM | POA: Diagnosis not present

## 2023-09-06 DIAGNOSIS — F17210 Nicotine dependence, cigarettes, uncomplicated: Secondary | ICD-10-CM | POA: Diagnosis present

## 2023-09-06 DIAGNOSIS — R6521 Severe sepsis with septic shock: Secondary | ICD-10-CM | POA: Diagnosis present

## 2023-09-06 DIAGNOSIS — Z720 Tobacco use: Secondary | ICD-10-CM

## 2023-09-06 DIAGNOSIS — J09X2 Influenza due to identified novel influenza A virus with other respiratory manifestations: Secondary | ICD-10-CM | POA: Diagnosis not present

## 2023-09-06 DIAGNOSIS — E860 Dehydration: Secondary | ICD-10-CM | POA: Diagnosis present

## 2023-09-06 LAB — BASIC METABOLIC PANEL
Anion gap: 11 (ref 5–15)
Anion gap: 10 (ref 5–15)
Anion gap: 8 (ref 5–15)
BUN: 18 mg/dL (ref 8–23)
BUN: 16 mg/dL (ref 8–23)
BUN: 15 mg/dL (ref 8–23)
CO2: 28 mmol/L (ref 22–32)
CO2: 28 mmol/L (ref 22–32)
CO2: 30 mmol/L (ref 22–32)
Calcium: 8.4 mg/dL — ABNORMAL LOW (ref 8.9–10.3)
Calcium: 8.3 mg/dL — ABNORMAL LOW (ref 8.9–10.3)
Calcium: 8.5 mg/dL — ABNORMAL LOW (ref 8.9–10.3)
Chloride: 84 mmol/L — ABNORMAL LOW (ref 98–111)
Chloride: 85 mmol/L — ABNORMAL LOW (ref 98–111)
Chloride: 88 mmol/L — ABNORMAL LOW (ref 98–111)
Creatinine, Ser: 0.79 mg/dL (ref 0.61–1.24)
Creatinine, Ser: 0.74 mg/dL (ref 0.61–1.24)
Creatinine, Ser: 0.79 mg/dL (ref 0.61–1.24)
GFR, Estimated: >60 mL/min (ref >60)
GFR, Estimated: >60 mL/min (ref >60)
GFR, Estimated: >60 mL/min (ref >60)
Glucose, Bld: 127 mg/dL — ABNORMAL HIGH (ref 70–99)
Glucose, Bld: 119 mg/dL — ABNORMAL HIGH (ref 70–99)
Glucose, Bld: 114 mg/dL — ABNORMAL HIGH (ref 70–99)
Potassium: 5.1 mmol/L (ref 3.5–5.1)
Potassium: 4.8 mmol/L (ref 3.5–5.1)
Potassium: 4.8 mmol/L (ref 3.5–5.1)
Sodium: 123 mmol/L — ABNORMAL LOW (ref 135–145)
Sodium: 123 mmol/L — ABNORMAL LOW (ref 135–145)
Sodium: 126 mmol/L — ABNORMAL LOW (ref 135–145)

## 2023-09-06 LAB — GLUCOSE, CAPILLARY
Glucose-Capillary: 119 mg/dL — ABNORMAL HIGH (ref 70–99)
Glucose-Capillary: 127 mg/dL — ABNORMAL HIGH (ref 70–99)
Glucose-Capillary: 134 mg/dL — ABNORMAL HIGH (ref 70–99)
Glucose-Capillary: 156 mg/dL — ABNORMAL HIGH (ref 70–99)
Glucose-Capillary: 191 mg/dL — ABNORMAL HIGH (ref 70–99)
Glucose-Capillary: 99 mg/dL (ref 70–99)

## 2023-09-06 LAB — SODIUM, URINE, RANDOM: Sodium, Ur: <10 mmol/L

## 2023-09-06 LAB — ECHOCARDIOGRAM COMPLETE
AR max vel: 2.2 cm2
AV Area VTI: 2.5 cm2
AV Area mean vel: 1.86 cm2
AV Mean grad: 2 mm[Hg]
AV Peak grad: 3.1 mm[Hg]
Ao pk vel: 0.87 m/s
Area-P 1/2: 4.39 cm2
Calc EF: 50.4 %
Height: 69.016 in
MV VTI: 1.92 cm2
S' Lateral: 2.6 cm
Single Plane A2C EF: 48.8 %
Single Plane A4C EF: 57.1 %
Weight: 2208.13 [oz_av]

## 2023-09-06 LAB — POCT I-STAT 7, (LYTES, BLD GAS, ICA,H+H)
Acid-Base Excess: 5 mmol/L — ABNORMAL HIGH (ref 0.0–2.0)
Bicarbonate: 35.3 mmol/L — ABNORMAL HIGH (ref 20.0–28.0)
Calcium, Ion: 1.17 mmol/L (ref 1.15–1.40)
HCT: 42 % (ref 39.0–52.0)
Hemoglobin: 14.3 g/dL (ref 13.0–17.0)
O2 Saturation: 100 %
Patient temperature: 97.5
Potassium: 4.3 mmol/L (ref 3.5–5.1)
Sodium: 123 mmol/L — ABNORMAL LOW (ref 135–145)
TCO2: 38 mmol/L — ABNORMAL HIGH (ref 22–32)
pCO2 arterial: 75.2 mm[Hg] (ref 32–48)
pH, Arterial: 7.277 — ABNORMAL LOW (ref 7.35–7.45)
pO2, Arterial: 289 mm[Hg] — ABNORMAL HIGH (ref 83–108)

## 2023-09-06 LAB — CBC
HCT: 38.6 % — ABNORMAL LOW (ref 39.0–52.0)
Hemoglobin: 13.1 g/dL (ref 13.0–17.0)
MCH: 29.7 pg (ref 26.0–34.0)
MCHC: 33.9 g/dL (ref 30.0–36.0)
MCV: 87.5 fL (ref 80.0–100.0)
Platelets: 265 10*3/uL (ref 150–400)
RBC: 4.41 MIL/uL (ref 4.22–5.81)
RDW: 12.4 % (ref 11.5–15.5)
WBC: 10.1 10*3/uL (ref 4.0–10.5)
nRBC: 0 % (ref 0.0–0.2)

## 2023-09-06 LAB — HIV ANTIBODY (ROUTINE TESTING W REFLEX): HIV Screen 4th Generation wRfx: NONREACTIVE

## 2023-09-06 LAB — TROPONIN I (HIGH SENSITIVITY): Troponin I (High Sensitivity): 23 ng/L — ABNORMAL HIGH (ref <18)

## 2023-09-06 LAB — MAGNESIUM: Magnesium: 2.4 mg/dL (ref 1.7–2.4)

## 2023-09-06 LAB — PHOSPHORUS: Phosphorus: 2.7 mg/dL (ref 2.5–4.6)

## 2023-09-06 LAB — MRSA NEXT GEN BY PCR, NASAL: MRSA by PCR Next Gen: NOT DETECTED

## 2023-09-06 LAB — LACTIC ACID, PLASMA: Lactic Acid, Venous: 1.8 mmol/L (ref 0.5–1.9)

## 2023-09-06 LAB — HEMOGLOBIN A1C
Hgb A1c MFr Bld: 5.4 % (ref 4.8–5.6)
Mean Plasma Glucose: 108.28 mg/dL

## 2023-09-06 LAB — OSMOLALITY, URINE: Osmolality, Ur: 279 mosm/kg — ABNORMAL LOW (ref 300–900)

## 2023-09-06 MED ORDER — LACTATED RINGERS IV SOLN: INTRAVENOUS | Status: DC

## 2023-09-06 MED ORDER — ORAL CARE MOUTH RINSE: 15.0000 mL | OROMUCOSAL | Status: DC | PRN

## 2023-09-06 MED ORDER — SODIUM CHLORIDE 0.9 % IV SOLN
2.0000 g | INTRAVENOUS | Status: AC
  Administered 2023-09-07 – 2023-09-10 (×4): 2 g via INTRAVENOUS
  Filled 2023-09-06 (×4): qty 20

## 2023-09-06 MED ORDER — ORAL CARE MOUTH RINSE
15.0000 mL | OROMUCOSAL | Status: DC
  Administered 2023-09-06 – 2023-09-08 (×27): 15 mL via OROMUCOSAL

## 2023-09-06 MED ORDER — SODIUM CHLORIDE 0.9 % IV SOLN
250.0000 mL | INTRAVENOUS | Status: AC
  Administered 2023-09-06: 250 mL via INTRAVENOUS

## 2023-09-06 MED ORDER — CLOPIDOGREL BISULFATE 75 MG PO TABS
75.0000 mg | ORAL_TABLET | Freq: Every day | ORAL | Status: DC
  Administered 2023-09-06 – 2023-09-07 (×2): 75 mg
  Filled 2023-09-06 (×2): qty 1

## 2023-09-06 MED ORDER — POLYETHYLENE GLYCOL 3350 17 G PO PACK: 17.0000 g | PACK | Freq: Every day | ORAL | Status: DC | PRN

## 2023-09-06 MED ORDER — METHYLPREDNISOLONE SODIUM SUCC 40 MG IJ SOLR
40.0000 mg | Freq: Every day | INTRAMUSCULAR | Status: DC
  Administered 2023-09-07: 40 mg via INTRAVENOUS
  Filled 2023-09-06 (×2): qty 1

## 2023-09-06 MED ORDER — MAGNESIUM SULFATE 2 GM/50ML IV SOLN
2.0000 g | Freq: Once | INTRAVENOUS | Status: AC
  Administered 2023-09-06: 2 g via INTRAVENOUS
  Filled 2023-09-06: qty 50

## 2023-09-06 MED ORDER — ASPIRIN 81 MG PO CHEW
81.0000 mg | CHEWABLE_TABLET | Freq: Every day | ORAL | Status: DC
  Administered 2023-09-06 – 2023-09-07 (×2): 81 mg
  Filled 2023-09-06 (×2): qty 1

## 2023-09-06 MED ORDER — ATORVASTATIN CALCIUM 40 MG PO TABS
40.0000 mg | ORAL_TABLET | Freq: Every day | ORAL | Status: DC
  Administered 2023-09-06 – 2023-09-07 (×2): 40 mg
  Filled 2023-09-06 (×2): qty 1

## 2023-09-06 MED ORDER — DOCUSATE SODIUM 50 MG/5ML PO LIQD
100.0000 mg | Freq: Two times a day (BID) | ORAL | Status: DC
Start: 2023-09-06 — End: 2023-09-07
  Administered 2023-09-06 – 2023-09-07 (×3): 100 mg
  Filled 2023-09-06 (×3): qty 10

## 2023-09-06 MED ORDER — CHLORHEXIDINE GLUCONATE CLOTH 2 % EX PADS
6.0000 | MEDICATED_PAD | Freq: Every day | CUTANEOUS | Status: DC
  Administered 2023-09-06 – 2023-09-12 (×7): 6 via TOPICAL

## 2023-09-06 MED ORDER — ENOXAPARIN SODIUM 40 MG/0.4ML IJ SOSY
40.0000 mg | PREFILLED_SYRINGE | Freq: Every day | INTRAMUSCULAR | Status: DC
  Administered 2023-09-06 – 2023-09-12 (×7): 40 mg via SUBCUTANEOUS
  Filled 2023-09-06 (×8): qty 0.4

## 2023-09-06 MED ORDER — POTASSIUM CHLORIDE 10 MEQ/100ML IV SOLN
10.0000 meq | INTRAVENOUS | Status: AC
  Administered 2023-09-06 (×3): 10 meq via INTRAVENOUS
  Filled 2023-09-06 (×3): qty 100

## 2023-09-06 MED ORDER — NOREPINEPHRINE 4 MG/250ML-% IV SOLN
2.0000 ug/min | INTRAVENOUS | Status: DC
  Administered 2023-09-06: 4 ug/min via INTRAVENOUS
  Filled 2023-09-06: qty 250

## 2023-09-06 NOTE — Progress Notes (Addendum)
eLink Physician-Brief Progress Note Patient Name: Jackson Mathews DOB: [birth year] MRN: [id number]   Date of Service  09/06/2023  HPI/Events of Note  29M with COPD, HTN, HLD, CAD,  carotid artery occlusions with recent revascularization on Plavix, recent influenza diagnosed one day PTA who p/w chest pain and confusion. ABG with acute hypercarbic and hypoxemic respiratory failure. Started on BiPAP with worsening hypercapnia. Intubated for respiratory distress. No hypotension but given 2L for sepsis protocol. Started on levophed. Transferred from [facility] ED to [facility] for PCCM admission. Of note diagnosed with influenza A as outpatient but neg on initial RVP panel. CT head with NAICA. CXR with RUL infiltrate. LA 4.8. WBC 10.9. Na 121 Ch 79. Normal renal function.  On arrival on MV FIO2 60% PEEP 5. Levophed 4  eICU Interventions  AHRF with hypercarbia/hypoxemia 2/2 influenza -Full vent support, F/u ABG, Repeat full RVP, nebs. Repeat CXR - advance ETT if needed  Dehydration - trend labs, mIVF   0200 ABG reviewed with improved findings. 7.27/75/289. Peak pressures nearing 35. Flow consistent with obstruction. No vent changes at this time.   0415 AM CXR reviewed. ETT 6.5 cm above carina. RT orders placed to advance tube 2-3 cm if able. Repeat CXR this am  0630 Bladder scan with 590 cc. I&O cath ordered    Intervention Category Evaluation Type: New Patient Evaluation  Tawny Raspberry Mechele Collin 09/06/2023, 12:37 AM

## 2023-09-06 NOTE — Progress Notes (Signed)
ETT advanced 2cm from 25 to 27 at lip as per CXR

## 2023-09-06 NOTE — Progress Notes (Signed)
NAME:  Jackson Mathews, MRN:  [id number], DOB:  [birth year], LOS: 0 ADMISSION DATE:  09/05/2023, CONSULTATION DATE:  09/05/23 REFERRING MD:  Deretha Emory CHIEF COMPLAINT:  Respiratory Distress  History of Present Illness:  Pt is encephelopathic; therefore, this HPI is obtained from chart review. Jackson Mathews is a 68 y.o. male who has a PMH as below including but not limited to COPD/emphysema (no PFTs in system but CXR does show emphysema), HTN, HLD, carotid artery occlusion, CAD 2017.  He presented to [facility] 2/11 after being diagnosed 1 day prior with influenza A with home test per sisters report (he was started on Tamiflu by PCP). On 2/11, he developed worsening symptoms including respiratory distress prompting him to drive to the ED. In ED, he had AMS and was in significant distress to the point he required intubation after failing BiPAP trial (had worsened ABG with worsening hypercapnia). He was intubated and was later transferred to [facility] for further evaluation.   Head CT was negative. CXR showed possible RUL infiltrate. He was started on Steroids, Ceftriaxone and Azithromycin and had cultures drawn. Of note, Flu/RSV/COVID were negative in ED though he reportedly did test positive for flu A 2/10 with home test (and started on Tamiflu by PCP as mentioned above).  Pertinent  Medical History  has Hearing loss; DYSPNEA; SKIN CANCER, HX OF; Hx of colonic polyp - ssp; Peripheral vascular disease (HCC); Carotid artery disease (HCC); History of right-sided carotid endarterectomy; Nicotine dependence with current use; CAD in native artery-cath 09/24/15 non obstructive; Essential hypertension; HLD (hyperlipidemia); Lower urinary tract symptoms (LUTS); Systolic murmur; Actinic keratosis; Hypoxia; Tobacco use; Bronchitis due to tobacco use; Hyponatremia; Leukocytosis; Carotid artery stenosis, symptomatic, right; and Respiratory failure (HCC) on their problem list.  Significant Hospital Events: Including  procedures, antibiotic start and stop dates in addition to other pertinent events   2/11 intubated, admit  2/12 tolerated SBT  Interim History / Subjective:  Sedated. On levophed.   Objective   Blood pressure 118/71, pulse 71, temperature (!) 97.5 F (36.4 C), temperature source Axillary, resp. rate (!) 24, height 5' 9.02" (1.753 m), weight 62.6 kg, SpO2 98%.    Vent Mode: PRVC FiO2 (%):  [35 %-60 %] 35 % Set Rate:  [16 bmp-24 bmp] 24 bmp Vt Set:  [560 mL] 560 mL PEEP:  [5 cmH20-6 cmH20] 5 cmH20 Plateau Pressure:  [30 cmH20] 30 cmH20   Intake/Output Summary (Last 24 hours) at 09/06/2023 0700 Last data filed at 09/06/2023 0600 Gross per 24 hour  Intake 1876.75 ml  Output --  Net 1876.75 ml   Filed Weights   09/05/23 2211 09/06/23 0037 09/06/23 0500  Weight: 59 kg 62.6 kg 62.6 kg    Examination: General: sedated, in NAD HENT: ETT in place Lungs: on vent, crackles bilaterally Cardiovascular: RRR Abdomen: bowel sounds, soft, non-distended Extremities: no LE edema Neuro: sedated  Resolved Hospital Problem list     Assessment & Plan:  Acute hypoxic and hypercapnic respiratory failure requiring mechanical ventilation - presumed 2/2 AECOPD, now s/p intubation after failing BiPAP. Hx COPD/emphysema (no PFT's on file). Ongoing tobacco dependence. Initial RVP negative. ABG improved last night but still respiratory acidosis.  Lactic acidosis resolved.  - Continue full vent support - Daily SBT - Continue CTX/Azithromcyin (day 2) - Decrease Solumedrol 40 mg to daily  - Pend Bcx - Pend full RVP  - Albuterol, DuoNebs. - Can try to get him established in our office as an outpatient for outpatient follow up. -  Tobacco cessation counseling.   Possible Flu A - reportedly tested positive at home 2/10 but negative in ED 2/11. - Continue empiric Tamiflu  - Droplet precautions  - Pending full RVP  Hyponatremia. Acute, at 123. No hx of hyponatremia. Volume down. Got IVF.  - on  IVF, correction of 5-6 so Na goal 128 - LR 75 ml/hr x 12 hours - Trend BMP - Pend urine Na, urine osm.     Hx HTN, HLD, CAD, carotid artery occlusion s/p right CAE 2017. - Hold PTA Amlodipine. - Continue PTA ASA, Clopidogrel, Atorvastatin.   Hyperglycemia. - Hgb A1c 5.4 - SSI   Best Practice (right click and "Reselect all SmartList Selections" daily)   Diet/type: NPO DVT prophylaxis LMWH Pressure ulcer(s): N/A GI prophylaxis: PPI Lines: N/A Foley:  N/A Code Status:  full code Last date of multidisciplinary goals of care discussion [updated sister today]  Labs   CBC: Recent Labs  Lab 09/05/23 1915 09/05/23 1933 09/05/23 2141 09/06/23 0108  WBC 10.9*  --   --   --   NEUTROABS 8.3*  --   --   --   HGB 15.1 16.3 15.3 14.3  HCT 46.4 48.0 45.0 42.0  MCV 90.1  --   --   --   PLT 332  --   --   --     Basic Metabolic Panel: Recent Labs  Lab 09/05/23 1915 09/05/23 1933 09/05/23 2141 09/06/23 0108  NA 121* 121* 123* 123*  K 3.5 3.3* 3.7 4.3  CL 79*  --   --   --   CO2 28  --   --   --   GLUCOSE 284*  --   --   --   BUN 14  --   --   --   CREATININE 0.74  --   --   --   CALCIUM 9.2  --   --   --   MG 1.9  --   --   --    GFR: Estimated Creatinine Clearance: 80.4 mL/min (by C-G formula based on SCr of 0.74 mg/dL). Recent Labs  Lab 09/05/23 1915 09/05/23 1931  WBC 10.9*  --   LATICACIDVEN  --  4.8*    Liver Function Tests: Recent Labs  Lab 09/05/23 1915  AST 40  ALT 26  ALKPHOS 96  BILITOT 0.9  PROT 7.0  ALBUMIN 4.0   No results for input(s): "LIPASE", "AMYLASE" in the last 168 hours. No results for input(s): "AMMONIA" in the last 168 hours.  ABG    Component Value Date/Time   PHART 7.277 (L) 09/06/2023 0108   PCO2ART 75.2 (HH) 09/06/2023 0108   PO2ART 289 (H) 09/06/2023 0108   HCO3 35.3 (H) 09/06/2023 0108   TCO2 38 (H) 09/06/2023 0108   ACIDBASEDEF 2.0 09/05/2023 1933   O2SAT 100 09/06/2023 0108     Coagulation Profile: No results for  input(s): "INR", "PROTIME" in the last 168 hours.  Cardiac Enzymes: No results for input(s): "CKTOTAL", "CKMB", "CKMBINDEX", "TROPONINI" in the last 168 hours.  HbA1C: Hgb A1c MFr Bld  Date/Time Value Ref Range Status  09/24/2015 04:00 AM 5.6 4.8 - 5.6 % Final    Comment:    (NOTE)         Pre-diabetes: 5.7 - 6.4         Diabetes: >6.4         Glycemic control for adults with diabetes: <7.0     CBG: Recent Labs  Lab  09/05/23 1907 09/06/23 0030 09/06/23 0350  GLUCAP 275* 191* 156*    Review of Systems:   Unable to obtain. Sedated.  Past Medical History:  He,  has a past medical history of CAD in native artery-cath 09/24/15 non obstructive (09/24/2015), Calf pain, Cancer (HCC), Carotid artery occlusion, Chest pain, COPD (chronic obstructive pulmonary disease) (HCC), Dyspnea, Hearing loss, Heart murmur, History of kidney stones, History of right-sided carotid endarterectomy (09/23/2015), Hx of colonic polyp - ssp (06/21/2015), Hypercholesteremia, Hypertension, Hypoxia (10/12/2018), Lower back pain, Palpitations, Tinnitus, Tobacco use (09/23/2015), and Vertigo.   Surgical History:   Past Surgical History:  Procedure Laterality Date   CARDIAC CATHETERIZATION N/A 09/24/2015   Procedure: Left Heart Cath and Coronary Angiography;  Surgeon: Kathleene Hazel, MD;  Location: [facility] INVASIVE CV LAB;  Service: Cardiovascular;  Laterality: N/A;   CAROTID ENDARTERECTOMY  05/05/11   Right CEA   MELANOMA EXCISION     l eye   SKIN CANCER EXCISION     TRANSCAROTID ARTERY REVASCULARIZATION  Right 06/28/2021   Procedure: RIGHT TRANSCAROTID ARTERY REVASCULARIZATION;  Surgeon: Victorino Sparrow, MD;  Location: [facility] OR;  Service: Vascular;  Laterality: Right;     Social History:   reports that he has been smoking cigarettes. He has a 60 pack-year smoking history. He has never used smokeless tobacco. He reports that he does not drink alcohol and does not use drugs.   Family History:  His family  history includes Cancer in his father; Diabetes in his brother; Heart attack in his father; Heart disease in his mother. There is no history of Colon cancer, Esophageal cancer, Ulcerative colitis, Stomach cancer, or Rectal cancer.   Allergies Allergies  Allergen Reactions   Codeine Nausea Only     Home Medications  Prior to Admission medications   Medication Sig Start Date End Date Taking? Authorizing Provider  albuterol (VENTOLIN HFA) 108 (90 Base) MCG/ACT inhaler INHALE 2 PUFFS BY MOUTH EVERY 6 HOURS INTO THE LUNGS AS NEEDED FOR WHEEZING OR SHORTNESS OF BREATH 12/22/20   Hilts, Casimiro Needle, MD  amLODipine (NORVASC) 5 MG tablet Take 5 mg by mouth daily.    [provider]  aspirin EC 81 MG tablet Take 1 tablet (81 mg total) by mouth daily. Swallow whole. 06/12/21   Victorino Sparrow, MD  atorvastatin (LIPITOR) 40 MG tablet Take 1 tablet (40 mg total) by mouth daily. 06/11/21   Setzer, Lynnell Jude, PA-C  clopidogrel (PLAVIX) 75 MG tablet Take 1 tablet (75 mg total) by mouth daily. 06/11/21   Setzer, Lynnell Jude, PA-C  ipratropium-albuterol (DUONEB) 0.5-2.5 (3) MG/3ML SOLN Take 3 mLs by nebulization every 6 (six) hours as needed (Shortness of breath or wheezing). 10/14/18   Clydie Braun, MD  oxyCODONE-acetaminophen (PERCOCET/ROXICET) 5-325 MG tablet Take 1 tablet by mouth every 6 (six) hours as needed for moderate pain. Patient not taking: Reported on 08/19/2022 06/29/21   Graceann Congress, PA-C  Polyethyl Glycol-Propyl Glycol (SYSTANE OP) Place 1 drop into both eyes daily as needed (dry eyes).    [provider]  Respiratory Therapy Supplies (NEBULIZER) DEVI 1 Device by Does not apply route as needed. 10/14/18   Clydie Braun, MD  sildenafil (VIAGRA) 50 MG tablet Take 1 tablet (50 mg total) by mouth daily as needed for erectile dysfunction. 05/11/20   Hilts, Casimiro Needle, MD     Critical care time:

## 2023-09-06 NOTE — Progress Notes (Signed)
PRELIMINARY RESULTS* Echocardiogram 2D Echocardiogram has been performed.  Ocie Doyne RDCS 09/06/2023, 10:55 AM

## 2023-09-06 NOTE — Plan of Care (Signed)
  Problem: Clinical Measurements: Goal: Ability to maintain clinical measurements within normal limits will improve Outcome: Progressing Goal: Diagnostic test results will improve Outcome: Progressing Goal: Cardiovascular complication will be avoided Outcome: Progressing   Problem: Activity: Goal: Risk for activity intolerance will decrease Outcome: Progressing   Problem: Nutrition: Goal: Adequate nutrition will be maintained Outcome: Progressing   Problem: Education: Goal: Knowledge of General Education information will improve Description: Including pain rating scale, medication(s)/side effects and non-pharmacologic comfort measures Outcome: Not Progressing   Problem: Health Behavior/Discharge Planning: Goal: Ability to manage health-related needs will improve Outcome: Not Progressing   Problem: Clinical Measurements: Goal: Will remain free from infection Outcome: Not Progressing

## 2023-09-07 DIAGNOSIS — J9622 Acute and chronic respiratory failure with hypercapnia: Secondary | ICD-10-CM | POA: Diagnosis not present

## 2023-09-07 DIAGNOSIS — J09X2 Influenza due to identified novel influenza A virus with other respiratory manifestations: Secondary | ICD-10-CM | POA: Diagnosis not present

## 2023-09-07 DIAGNOSIS — J9621 Acute and chronic respiratory failure with hypoxia: Secondary | ICD-10-CM | POA: Diagnosis not present

## 2023-09-07 LAB — GLUCOSE, CAPILLARY
Glucose-Capillary: 102 mg/dL — ABNORMAL HIGH (ref 70–99)
Glucose-Capillary: 105 mg/dL — ABNORMAL HIGH (ref 70–99)
Glucose-Capillary: 114 mg/dL — ABNORMAL HIGH (ref 70–99)
Glucose-Capillary: 118 mg/dL — ABNORMAL HIGH (ref 70–99)
Glucose-Capillary: 136 mg/dL — ABNORMAL HIGH (ref 70–99)
Glucose-Capillary: 140 mg/dL — ABNORMAL HIGH (ref 70–99)
Glucose-Capillary: 160 mg/dL — ABNORMAL HIGH (ref 70–99)

## 2023-09-07 LAB — RESPIRATORY PANEL BY PCR

## 2023-09-07 LAB — CBC
HCT: 33.9 % — ABNORMAL LOW (ref 39.0–52.0)
Hemoglobin: 11.4 g/dL — ABNORMAL LOW (ref 13.0–17.0)
MCH: 29.7 pg (ref 26.0–34.0)
MCHC: 33.6 g/dL (ref 30.0–36.0)
MCV: 88.3 fL (ref 80.0–100.0)
Platelets: 241 10*3/uL (ref 150–400)
RBC: 3.84 MIL/uL — ABNORMAL LOW (ref 4.22–5.81)
RDW: 12.6 % (ref 11.5–15.5)
WBC: 7.4 10*3/uL (ref 4.0–10.5)
nRBC: 0 % (ref 0.0–0.2)

## 2023-09-07 LAB — BASIC METABOLIC PANEL
Anion gap: 11 (ref 5–15)
Anion gap: 9 (ref 5–15)
Anion gap: 12 (ref 5–15)
Anion gap: 8 (ref 5–15)
BUN: 16 mg/dL (ref 8–23)
BUN: 19 mg/dL (ref 8–23)
BUN: 20 mg/dL (ref 8–23)
BUN: 17 mg/dL (ref 8–23)
CO2: 28 mmol/L (ref 22–32)
CO2: 29 mmol/L (ref 22–32)
CO2: 29 mmol/L (ref 22–32)
CO2: 33 mmol/L — ABNORMAL HIGH (ref 22–32)
Calcium: 8.4 mg/dL — ABNORMAL LOW (ref 8.9–10.3)
Calcium: 8.5 mg/dL — ABNORMAL LOW (ref 8.9–10.3)
Calcium: 9 mg/dL (ref 8.9–10.3)
Calcium: 9.1 mg/dL (ref 8.9–10.3)
Chloride: 86 mmol/L — ABNORMAL LOW (ref 98–111)
Chloride: 88 mmol/L — ABNORMAL LOW (ref 98–111)
Chloride: 89 mmol/L — ABNORMAL LOW (ref 98–111)
Chloride: 94 mmol/L — ABNORMAL LOW (ref 98–111)
Creatinine, Ser: 0.73 mg/dL (ref 0.61–1.24)
Creatinine, Ser: 0.85 mg/dL (ref 0.61–1.24)
Creatinine, Ser: 0.8 mg/dL (ref 0.61–1.24)
Creatinine, Ser: 0.78 mg/dL (ref 0.61–1.24)
GFR, Estimated: >60 mL/min (ref >60)
GFR, Estimated: >60 mL/min (ref >60)
GFR, Estimated: >60 mL/min (ref >60)
GFR, Estimated: >60 mL/min (ref >60)
Glucose, Bld: 88 mg/dL (ref 70–99)
Glucose, Bld: 122 mg/dL — ABNORMAL HIGH (ref 70–99)
Glucose, Bld: 123 mg/dL — ABNORMAL HIGH (ref 70–99)
Glucose, Bld: 118 mg/dL — ABNORMAL HIGH (ref 70–99)
Potassium: 4.9 mmol/L (ref 3.5–5.1)
Potassium: 4.5 mmol/L (ref 3.5–5.1)
Potassium: 4.6 mmol/L (ref 3.5–5.1)
Potassium: 5 mmol/L (ref 3.5–5.1)
Sodium: 125 mmol/L — ABNORMAL LOW (ref 135–145)
Sodium: 126 mmol/L — ABNORMAL LOW (ref 135–145)
Sodium: 130 mmol/L — ABNORMAL LOW (ref 135–145)
Sodium: 135 mmol/L (ref 135–145)

## 2023-09-07 MED ORDER — LACTATED RINGERS IV SOLN: INTRAVENOUS | Status: DC

## 2023-09-07 MED ORDER — ATORVASTATIN CALCIUM 40 MG PO TABS
40.0000 mg | ORAL_TABLET | Freq: Every day | ORAL | Status: DC
  Administered 2023-09-08 – 2023-09-12 (×5): 40 mg via ORAL
  Filled 2023-09-07 (×5): qty 1

## 2023-09-07 MED ORDER — DOCUSATE SODIUM 100 MG PO CAPS
100.0000 mg | ORAL_CAPSULE | Freq: Two times a day (BID) | ORAL | Status: DC
  Administered 2023-09-08 – 2023-09-12 (×8): 100 mg via ORAL
  Filled 2023-09-07 (×8): qty 1

## 2023-09-07 MED ORDER — ASPIRIN 81 MG PO CHEW
81.0000 mg | CHEWABLE_TABLET | Freq: Every day | ORAL | Status: DC
  Administered 2023-09-08 – 2023-09-12 (×5): 81 mg via ORAL
  Filled 2023-09-07 (×5): qty 1

## 2023-09-07 MED ORDER — CLOPIDOGREL BISULFATE 75 MG PO TABS
75.0000 mg | ORAL_TABLET | Freq: Every day | ORAL | Status: DC
  Administered 2023-09-08 – 2023-09-12 (×5): 75 mg via ORAL
  Filled 2023-09-07 (×5): qty 1

## 2023-09-07 MED ORDER — OSELTAMIVIR PHOSPHATE 75 MG PO CAPS
75.0000 mg | ORAL_CAPSULE | Freq: Two times a day (BID) | ORAL | Status: AC
  Administered 2023-09-07 – 2023-09-12 (×10): 75 mg via ORAL
  Filled 2023-09-07 (×11): qty 1

## 2023-09-07 MED ORDER — NICOTINE 14 MG/24HR TD PT24
14.0000 mg | MEDICATED_PATCH | Freq: Every day | TRANSDERMAL | Status: DC
  Administered 2023-09-07 – 2023-09-12 (×6): 14 mg via TRANSDERMAL
  Filled 2023-09-07 (×7): qty 1

## 2023-09-07 NOTE — Procedures (Signed)
Extubation Procedure Note  Patient Details:   Name: Jackson Mathews DOB: 1956/01/30 MRN: [id number]   Airway Documentation:    Vent end date: 09/07/23 Vent end time: 1220   Evaluation  O2 sats: stable throughout Complications: No apparent complications Patient did tolerate procedure well. Bilateral Breath Sounds: Diminished, Rhonchi   Patient extubated per MD's order with RT, RN and MD at bedside, cuff leak present prior to extubation and no stridor noted post. Patient placed on 3L [place] tolerating well at this time. Patient aware of time and place and was able to speak his name post extubation.     Yes  [name] 09/07/2023, 12:24 PM

## 2023-09-07 NOTE — Progress Notes (Signed)
eLink Physician-Brief Progress Note Patient Name: LINKOLN ALKIRE DOB: September 29, 1955 MRN: [id number]   Date of Service  09/07/2023  HPI/Events of Note  40M smoker with COPD, HTN and carotid artery disease who is admitted for acute on chronic hypercapnic respiratory failure in setting of influenza A infection. He was intubated and sedated.   Now extubated, requesting nicotine patch  eICU Interventions  Add nicotine patch   0453 -patient.  Anxious on exam, requesting something for anxiety.  Will add alprazolam x 1  Intervention Category Minor Interventions: Routine modifications to care plan (e.g. PRN medications for pain, fever)  Chetara Kropp 09/07/2023, 8:32 PM

## 2023-09-07 NOTE — Progress Notes (Signed)
NAME:  Jackson Mathews, MRN:  [id number], DOB:  [birth year], LOS: 1 ADMISSION DATE:  09/05/2023, CONSULTATION DATE:  09/05/23 REFERRING MD:  Deretha Emory CHIEF COMPLAINT:  Respiratory Distress  History of Present Illness:  Pt is encephelopathic; therefore, this HPI is obtained from chart review. Jackson Mathews is a 68 y.o. male who has a PMH as below including but not limited to COPD/emphysema (no PFTs in system but CXR does show emphysema), HTN, HLD, carotid artery occlusion, CAD 2017.  He presented to [facility] 2/11 after being diagnosed 1 day prior with influenza A with home test per sisters report (he was started on Tamiflu by PCP). On 2/11, he developed worsening symptoms including respiratory distress prompting him to drive to the ED. In ED, he had AMS and was in significant distress to the point he required intubation after failing BiPAP trial (had worsened ABG with worsening hypercapnia). He was intubated and was later transferred to [facility] for further evaluation.   Head CT was negative. CXR showed possible RUL infiltrate. He was started on Steroids, Ceftriaxone and Azithromycin and had cultures drawn. Of note, Flu/RSV/COVID were negative in ED though he reportedly did test positive for flu A 2/10 with home test (and started on Tamiflu by PCP as mentioned above).  Pertinent  Medical History  has Hearing loss; DYSPNEA; SKIN CANCER, HX OF; Hx of colonic polyp - ssp; Peripheral vascular disease (HCC); Carotid artery disease (HCC); History of right-sided carotid endarterectomy; Nicotine dependence with current use; CAD in native artery-cath 09/24/15 non obstructive; Essential hypertension; HLD (hyperlipidemia); Lower urinary tract symptoms (LUTS); Systolic murmur; Actinic keratosis; Hypoxia; Tobacco use; Bronchitis due to tobacco use; Hyponatremia; Leukocytosis; Carotid artery stenosis, symptomatic, right; and Respiratory failure (HCC) on their problem list.  Significant Hospital Events: Including  procedures, antibiotic start and stop dates in addition to other pertinent events   2/11 intubated, admit  2/12 tolerated SBT 2/13 tolerated SBT  Interim History / Subjective:  Awake, tolerated SBT this morning, plan for extubation today.   Objective   Blood pressure 125/62, pulse 74, temperature 98.7 F (37.1 C), temperature source Oral, resp. rate 15, height 5' 9.02" (1.753 m), weight 62.6 kg, SpO2 91%.    Vent Mode: PRVC FiO2 (%):  [35 %-40 %] 35 % Set Rate:  [24 bmp] 24 bmp Vt Set:  [560 mL] 560 mL PEEP:  [5 cmH20] 5 cmH20 Pressure Support:  [5 cmH20] 5 cmH20 Plateau Pressure:  [20 cmH20-26 cmH20] 26 cmH20   Intake/Output Summary (Last 24 hours) at 09/07/2023 8295 Last data filed at 09/07/2023 0600 Gross per 24 hour  Intake 2025.6 ml  Output --  Net 2025.6 ml   Filed Weights   09/06/23 0037 09/06/23 0500 09/07/23 0500  Weight: 62.6 kg 62.6 kg 62.6 kg    Examination: General: awake/alert, in NAD HENT: ETT in place Lungs: on vent, lungs clear to auscultation bilaterally Cardiovascular: RRR Abdomen: bowel sounds, soft, non-distended Extremities: no LE edema Neuro: awake/alert   Resolved Hospital Problem list     Assessment & Plan:  Acute hypoxic and hypercapnic respiratory failure requiring mechanical ventilation - presumed 2/2 AECOPD, now s/p intubation after failing BiPAP. Hx COPD/emphysema (no PFT's on file). Ongoing tobacco dependence. Initial RVP negative. ABG improved last night but still respiratory acidosis.  Lactic acidosis resolved. Full RVP negative.  - Continue full vent support - SBT tolerated, plan to extubate later today  - Continue CTX (day 3/5)/Azithromcyin (day 3/3) - Solumedrol 40 mg daily  - Pend Bcx (  so far no growth) - Albuterol, DuoNebs - Can try to get him established in our office as an outpatient for outpatient follow up. - Tobacco cessation counseling.   Possible Flu A - reportedly tested positive at home 2/10 but negative in ED  2/11. Full RVP negative. Droplet precautions.  - Continue empiric Tamiflu x 5 days  Hyponatremia. Acute, improved 126. No hx of hyponatremia. Volume down.   - Correction of 5-6 so Na goal 132 - LR 75 ml/hr x 1 day - Trend BMP Q6H  - Urine Osm and Na - hypovolemia    Hx HTN, HLD, CAD, carotid artery occlusion s/p right CAE 2017. - Hold PTA Amlodipine. - Continue PTA ASA, Clopidogrel, Atorvastatin.   Hyperglycemia. - Hgb A1c 5.4 - SSI   Best Practice (right click and "Reselect all SmartList Selections" daily)   Diet/type: NPO DVT prophylaxis LMWH Pressure ulcer(s): N/A GI prophylaxis: PPI Lines: N/A Foley:  N/A Code Status:  full code Last date of multidisciplinary goals of care discussion [updated family today]  Labs   CBC: Recent Labs  Lab 09/05/23 1915 09/05/23 1933 09/05/23 2141 09/06/23 0108 09/06/23 0832 09/07/23 0223  WBC 10.9*  --   --   --  10.1 7.4  NEUTROABS 8.3*  --   --   --   --   --   HGB 15.1 16.3 15.3 14.3 13.1 11.4*  HCT 46.4 48.0 45.0 42.0 38.6* 33.9*  MCV 90.1  --   --   --  87.5 88.3  PLT 332  --   --   --  265 241    Basic Metabolic Panel: Recent Labs  Lab 09/05/23 1915 09/05/23 1933 09/06/23 0832 09/06/23 1150 09/06/23 1600 09/06/23 2006 09/07/23 0223  NA 121*   < > 123* 123* 126* 125* 126*  K 3.5   < > 5.1 4.8 4.8 4.9 4.5  CL 79*  --  84* 85* 88* 86* 88*  CO2 28  --  28 28 30 28 29   GLUCOSE 284*  --  127* 119* 114* 88 122*  BUN 14  --  18 16 15 16 19   CREATININE 0.74  --  0.79 0.74 0.79 0.73 0.85  CALCIUM 9.2  --  8.4* 8.3* 8.5* 8.4* 8.5*  MG 1.9  --  2.4  --   --   --   --   PHOS  --   --  2.7  --   --   --   --    < > = values in this interval not displayed.   GFR: Estimated Creatinine Clearance: 75.7 mL/min (by C-G formula based on SCr of 0.85 mg/dL). Recent Labs  Lab 09/05/23 1915 09/05/23 1931 09/06/23 0832 09/07/23 0223  WBC 10.9*  --  10.1 7.4  LATICACIDVEN  --  4.8* 1.8  --     Liver Function  Tests: Recent Labs  Lab 09/05/23 1915  AST 40  ALT 26  ALKPHOS 96  BILITOT 0.9  PROT 7.0  ALBUMIN 4.0   No results for input(s): "LIPASE", "AMYLASE" in the last 168 hours. No results for input(s): "AMMONIA" in the last 168 hours.  ABG    Component Value Date/Time   PHART 7.277 (L) 09/06/2023 0108   PCO2ART 75.2 (HH) 09/06/2023 0108   PO2ART 289 (H) 09/06/2023 0108   HCO3 35.3 (H) 09/06/2023 0108   TCO2 38 (H) 09/06/2023 0108   ACIDBASEDEF 2.0 09/05/2023 1933   O2SAT 100 09/06/2023 0108  Coagulation Profile: No results for input(s): "INR", "PROTIME" in the last 168 hours.  Cardiac Enzymes: No results for input(s): "CKTOTAL", "CKMB", "CKMBINDEX", "TROPONINI" in the last 168 hours.  HbA1C: Hgb A1c MFr Bld  Date/Time Value Ref Range Status  09/06/2023 08:32 AM 5.4 4.8 - 5.6 % Final    Comment:    (NOTE) Pre diabetes:          5.7%-6.4%  Diabetes:              >6.4%  Glycemic control for   <7.0% adults with diabetes   09/24/2015 04:00 AM 5.6 4.8 - 5.6 % Final    Comment:    (NOTE)         Pre-diabetes: 5.7 - 6.4         Diabetes: >6.4         Glycemic control for adults with diabetes: <7.0     CBG: Recent Labs  Lab 09/06/23 1142 09/06/23 1540 09/06/23 1936 09/07/23 0000 09/07/23 0327  GLUCAP 127* 119* 99 136* 118*    Review of Systems:   Unable to obtain. Sedated.  Past Medical History:  He,  has a past medical history of CAD in native artery-cath 09/24/15 non obstructive (09/24/2015), Calf pain, Cancer (HCC), Carotid artery occlusion, Chest pain, COPD (chronic obstructive pulmonary disease) (HCC), Dyspnea, Hearing loss, Heart murmur, History of kidney stones, History of right-sided carotid endarterectomy (09/23/2015), Hx of colonic polyp - ssp (06/21/2015), Hypercholesteremia, Hypertension, Hypoxia (10/12/2018), Lower back pain, Palpitations, Tinnitus, Tobacco use (09/23/2015), and Vertigo.   Surgical History:   Past Surgical History:  Procedure  Laterality Date   CARDIAC CATHETERIZATION N/A 09/24/2015   Procedure: Left Heart Cath and Coronary Angiography;  Surgeon: Kathleene Hazel, MD;  Location[facility] INVASIVE CV LAB;  Service: Cardiovascular;  Laterality: N/A;   CAROTID ENDARTERECTOMY  05/05/11   Right CEA   MELANOMA EXCISION     l eye   SKIN CANCER EXCISION     TRANSCAROTID ARTERY REVASCULARIZATION  Right 06/28/2021   Procedure: RIGHT TRANSCAROTID ARTERY REVASCULARIZATION;  Surgeon: Victorino Sparrow, MD;  Location: [facility] OR;  Service: Vascular;  Laterality: Right;     Social History:   reports that he has been smoking cigarettes. He has a 60 pack-year smoking history. He has never used smokeless tobacco. He reports that he does not drink alcohol and does not use drugs.   Family History:  His family history includes Cancer in his father; Diabetes in his brother; Heart attack in his father; Heart disease in his mother. There is no history of Colon cancer, Esophageal cancer, Ulcerative colitis, Stomach cancer, or Rectal cancer.   Allergies Allergies  Allergen Reactions   Codeine Nausea Only     Home Medications  Prior to Admission medications   Medication Sig Start Date End Date Taking? Authorizing Provider  albuterol (VENTOLIN HFA) 108 (90 Base) MCG/ACT inhaler INHALE 2 PUFFS BY MOUTH EVERY 6 HOURS INTO THE LUNGS AS NEEDED FOR WHEEZING OR SHORTNESS OF BREATH 12/22/20   Hilts, Casimiro Needle, MD  amLODipine (NORVASC) 5 MG tablet Take 5 mg by mouth daily.    [provider]  aspirin EC 81 MG tablet Take 1 tablet (81 mg total) by mouth daily. Swallow whole. 06/12/21   Victorino Sparrow, MD  atorvastatin (LIPITOR) 40 MG tablet Take 1 tablet (40 mg total) by mouth daily. 06/11/21   Setzer, Lynnell Jude, PA-C  clopidogrel (PLAVIX) 75 MG tablet Take 1 tablet (75 mg total) by mouth daily. 06/11/21  Setzer, Lynnell Jude, PA-C  ipratropium-albuterol (DUONEB) 0.5-2.5 (3) MG/3ML SOLN Take 3 mLs by nebulization every 6 (six) hours as needed  (Shortness of breath or wheezing). 10/14/18   Clydie Braun, MD  oxyCODONE-acetaminophen (PERCOCET/ROXICET) 5-325 MG tablet Take 1 tablet by mouth every 6 (six) hours as needed for moderate pain. Patient not taking: Reported on 08/19/2022 06/29/21   Graceann Congress, PA-C  Polyethyl Glycol-Propyl Glycol (SYSTANE OP) Place 1 drop into both eyes daily as needed (dry eyes).    [provider]  Respiratory Therapy Supplies (NEBULIZER) DEVI 1 Device by Does not apply route as needed. 10/14/18   Clydie Braun, MD  sildenafil (VIAGRA) 50 MG tablet Take 1 tablet (50 mg total) by mouth daily as needed for erectile dysfunction. 05/11/20   Hilts, Casimiro Needle, MD     Critical care time:

## 2023-09-07 NOTE — TOC CM/SW Note (Signed)
Transition of Care [facility]) - Inpatient Brief Assessment   Patient Details  Name: Jackson Mathews MRN: [id number] Date of Birth: [birth year]  Transition of [facility]) CM/SW Contact:    Harriet Masson, RN Phone Number: 09/07/2023, 1:46 PM   Clinical Narrative:  NCM unable to assess patient due to intubation at this time. Patient not medically stable for discharge.  NCM will continue to follow as patient progresses with care towards discharge.   Transition of Care Asessment: Insurance and Status: Insurance coverage has been reviewed Patient has primary care physician: Yes     Prior/Current Home Services: No current home services Social Drivers of Health Review: SDOH reviewed no interventions necessary Readmission risk has been reviewed: Yes Transition of care needs: no transition of care needs at this time

## 2023-09-08 DIAGNOSIS — J9622 Acute and chronic respiratory failure with hypercapnia: Secondary | ICD-10-CM | POA: Diagnosis not present

## 2023-09-08 DIAGNOSIS — J9621 Acute and chronic respiratory failure with hypoxia: Secondary | ICD-10-CM | POA: Diagnosis not present

## 2023-09-08 DIAGNOSIS — J09X2 Influenza due to identified novel influenza A virus with other respiratory manifestations: Secondary | ICD-10-CM | POA: Diagnosis not present

## 2023-09-08 LAB — BASIC METABOLIC PANEL
Anion gap: 14 (ref 5–15)
Anion gap: 9 (ref 5–15)
BUN: 11 mg/dL (ref 8–23)
BUN: 15 mg/dL (ref 8–23)
CO2: 33 mmol/L — ABNORMAL HIGH (ref 22–32)
CO2: 34 mmol/L — ABNORMAL HIGH (ref 22–32)
Calcium: 8.6 mg/dL — ABNORMAL LOW (ref 8.9–10.3)
Calcium: 8.7 mg/dL — ABNORMAL LOW (ref 8.9–10.3)
Chloride: 90 mmol/L — ABNORMAL LOW (ref 98–111)
Chloride: 95 mmol/L — ABNORMAL LOW (ref 98–111)
Creatinine, Ser: 0.62 mg/dL (ref 0.61–1.24)
Creatinine, Ser: 0.7 mg/dL (ref 0.61–1.24)
GFR, Estimated: >60 mL/min (ref >60)
GFR, Estimated: >60 mL/min (ref >60)
Glucose, Bld: 100 mg/dL — ABNORMAL HIGH (ref 70–99)
Glucose, Bld: 130 mg/dL — ABNORMAL HIGH (ref 70–99)
Potassium: 4 mmol/L (ref 3.5–5.1)
Potassium: 4.5 mmol/L (ref 3.5–5.1)
Sodium: 137 mmol/L (ref 135–145)
Sodium: 138 mmol/L (ref 135–145)

## 2023-09-08 LAB — CBC
HCT: 42.9 % (ref 39.0–52.0)
Hemoglobin: 14.1 g/dL (ref 13.0–17.0)
MCH: 29.8 pg (ref 26.0–34.0)
MCHC: 32.9 g/dL (ref 30.0–36.0)
MCV: 90.7 fL (ref 80.0–100.0)
Platelets: 307 10*3/uL (ref 150–400)
RBC: 4.73 MIL/uL (ref 4.22–5.81)
RDW: 12.8 % (ref 11.5–15.5)
WBC: 11.3 10*3/uL — ABNORMAL HIGH (ref 4.0–10.5)
nRBC: 0 % (ref 0.0–0.2)

## 2023-09-08 LAB — GLUCOSE, CAPILLARY
Glucose-Capillary: 98 mg/dL (ref 70–99)
Glucose-Capillary: 112 mg/dL — ABNORMAL HIGH (ref 70–99)
Glucose-Capillary: 136 mg/dL — ABNORMAL HIGH (ref 70–99)
Glucose-Capillary: 134 mg/dL — ABNORMAL HIGH (ref 70–99)
Glucose-Capillary: 151 mg/dL — ABNORMAL HIGH (ref 70–99)

## 2023-09-08 MED ORDER — PREDNISONE 20 MG PO TABS: 40.0000 mg | ORAL_TABLET | Freq: Every day | ORAL | Status: DC

## 2023-09-08 MED ORDER — HYDRALAZINE HCL 20 MG/ML IJ SOLN
5.0000 mg | Freq: Four times a day (QID) | INTRAMUSCULAR | Status: DC | PRN
  Administered 2023-09-08 – 2023-09-09 (×3): 5 mg via INTRAVENOUS
  Filled 2023-09-08 (×3): qty 1

## 2023-09-08 MED ORDER — METHYLPREDNISOLONE SODIUM SUCC 40 MG IJ SOLR
40.0000 mg | Freq: Every day | INTRAMUSCULAR | Status: AC
  Administered 2023-09-08 – 2023-09-10 (×3): 40 mg via INTRAVENOUS
  Filled 2023-09-08 (×3): qty 1

## 2023-09-08 MED ORDER — ALPRAZOLAM 0.5 MG PO TABS
0.5000 mg | ORAL_TABLET | Freq: Once | ORAL | Status: AC
  Administered 2023-09-08: 0.5 mg via ORAL
  Filled 2023-09-08: qty 1

## 2023-09-08 MED ORDER — INSULIN ASPART 100 UNIT/ML IJ SOLN
0.0000 [IU] | Freq: Three times a day (TID) | INTRAMUSCULAR | Status: DC
  Administered 2023-09-09 (×2): 2 [IU] via SUBCUTANEOUS
  Administered 2023-09-09: 1 [IU] via SUBCUTANEOUS
  Administered 2023-09-10: 2 [IU] via SUBCUTANEOUS
  Administered 2023-09-10: 1 [IU] via SUBCUTANEOUS
  Administered 2023-09-11 (×2): 2 [IU] via SUBCUTANEOUS
  Administered 2023-09-11: 1 [IU] via SUBCUTANEOUS

## 2023-09-08 MED ORDER — HYDROXYZINE HCL 25 MG PO TABS
25.0000 mg | ORAL_TABLET | Freq: Once | ORAL | Status: AC
Start: 2023-09-08 — End: 2023-09-08
  Administered 2023-09-08: 25 mg via ORAL
  Filled 2023-09-08: qty 1

## 2023-09-08 MED ORDER — ALPRAZOLAM 0.5 MG PO TABS
0.5000 mg | ORAL_TABLET | Freq: Two times a day (BID) | ORAL | Status: DC | PRN
  Administered 2023-09-08: 0.5 mg via ORAL
  Filled 2023-09-08: qty 1

## 2023-09-08 MED ORDER — AMLODIPINE BESYLATE 5 MG PO TABS
5.0000 mg | ORAL_TABLET | Freq: Every day | ORAL | Status: DC
  Administered 2023-09-08: 5 mg via ORAL
  Filled 2023-09-08: qty 1

## 2023-09-08 MED ORDER — HYDRALAZINE HCL 20 MG/ML IJ SOLN
5.0000 mg | Freq: Once | INTRAMUSCULAR | Status: AC
  Administered 2023-09-08: 5 mg via INTRAVENOUS
  Filled 2023-09-08: qty 1

## 2023-09-08 NOTE — Evaluation (Signed)
Physical Therapy Evaluation Patient Details Name: Jackson Mathews MRN: [id number] DOB: 04-02-1956 Today's Date: 09/08/2023  History of Present Illness  68 y.o. male admitted 2/11 with respiratory distress, after  being diagnosed with influenza A by his primary care doctor 2/10 and started on Tamiflu. Required intubation after failing BiPAP trial. Extubated 2/13. PMHx: Hearing loss; DYSPNEA; SKIN CANCER, HX OF; Hx of colonic polyp - ssp; Peripheral vascular disease; Carotid artery disease; History of right-sided carotid endarterectomy; Nicotine dependence with current use; CAD in native artery-cath 09/24/15 non obstructive; Essential hypertension; HLD (hyperlipidemia); Lower urinary tract symptoms (LUTS); Systolic murmur; Actinic keratosis; Hypoxia; Tobacco use; Bronchitis due to tobacco use; Hyponatremia; Leukocytosis; Carotid artery stenosis, symptomatic, right; and Respiratory failure on their problem list.    Clinical Impression  Pt admitted with above diagnosis. Independent and driving PTA. Able to transfer out of bed to recliner with CGA today. Moderate dyspnea, limited by fatigue. BP a bit elevated prior to session, down to 165/87 after IV hydralazine administered by RN. Spo2 93% on 4L supplemental O2, HR 105, RR 33. Anticipate good functional progress as he stabilizes medically. Will follow and progress as tolerated, updating recs as appropriate. Pt currently with functional limitations due to the deficits listed below (see PT Problem List). Pt will benefit from acute skilled PT to increase their independence and safety with mobility to allow discharge.           If plan is discharge home, recommend the following: A little help with walking and/or transfers;A little help with bathing/dressing/bathroom;Assistance with cooking/housework;Assist for transportation;Help with stairs or ramp for entrance   Can travel by private vehicle        Equipment Recommendations Other (comment) (TBD;  unlikely to have needs)  Recommendations for Other Services       Functional Status Assessment Patient has had a recent decline in their functional status and demonstrates the ability to make significant improvements in function in a reasonable and predictable amount of time.     Precautions / Restrictions Precautions Precautions: Fall Recall of Precautions/Restrictions: Intact Precaution/Restrictions Comments: Monitor O2 Restrictions Weight Bearing Restrictions Per Provider Order: No      Mobility  Bed Mobility Overal bed mobility: Needs Assistance Bed Mobility: Supine to Sit     Supine to sit: Contact guard     General bed mobility comments: CGA for safety, slower to rise.    Transfers Overall transfer level: Needs assistance Equipment used: None Transfers: Sit to/from Stand, Bed to chair/wheelchair/BSC Sit to Stand: Contact guard assist   Step pivot transfers: Contact guard assist       General transfer comment: CGA for safety to stand, reaching for arm rest on chair. CGA for step pivot to chair. easily fatigued. Cues for technique. Some weakness, and trouble sequencing, seems a bit anxious, likely due to increased WOB.    Ambulation/Gait               General Gait Details: Deferred due to dyspnea. Fatigue.  Stairs            Wheelchair Mobility     Tilt Bed    Modified Rankin (Stroke Patients Only)       Balance Overall balance assessment: Needs assistance Sitting-balance support: No upper extremity supported, Feet supported Sitting balance-Leahy Scale: Fair     Standing balance support: Single extremity supported Standing balance-Leahy Scale: Poor  Pertinent Vitals/Pain Pain Assessment Pain Assessment: No/denies pain    Home Living Family/patient expects to be discharged to:: Private residence Living Arrangements: Alone Available Help at Discharge: Family;Available  PRN/intermittently Type of Home: House Home Access: Stairs to enter Entrance Stairs-Rails: Doctor, general practice of Steps: 2   Home Layout: One level Home Equipment: None      Prior Function Prior Level of Function : Independent/Modified Independent;Driving             Mobility Comments: Ind ADLs Comments: ind, drives; used to work in [employer]     Extremity/Trunk Assessment   Upper Extremity Assessment Upper Extremity Assessment: Defer to OT evaluation    Lower Extremity Assessment Lower Extremity Assessment: Generalized weakness       Communication   Communication Communication: No apparent difficulties    Cognition Arousal: Alert Behavior During Therapy: Anxious   PT - Cognitive impairments: Sequencing, Problem solving, Initiation                         Following commands: Impaired Following commands impaired: Only follows one step commands consistently, Follows one step commands with increased time     Cueing Cueing Techniques: Verbal cues, Gestural cues     General Comments General comments (skin integrity, edema, etc.): RR in 30s. HR 104, SpO2 93% on 4L supplemental O2. After transfer seated in recliner BP 165/87    Exercises General Exercises - Lower Extremity Ankle Circles/Pumps: AROM, Both, 10 reps, Supine Quad Sets: Strengthening, Both, Supine, 10 reps Gluteal Sets: Strengthening, Both, 10 reps, Supine   Assessment/Plan    PT Assessment Patient needs continued PT services  PT Problem List Decreased strength;Decreased activity tolerance;Decreased balance;Decreased mobility;Decreased knowledge of use of DME;Decreased cognition;Cardiopulmonary status limiting activity       PT Treatment Interventions DME instruction;Gait training;Stair training;Functional mobility training;Therapeutic activities;Therapeutic exercise;Balance training;Neuromuscular re-education;Patient/family education;Cognitive remediation    PT Goals  (Current goals can be found in the Care Plan section)  Acute Rehab PT Goals Patient Stated Goal: get well PT Goal Formulation: With patient Time For Goal Achievement: 09/22/23 Potential to Achieve Goals: Good    Frequency Min 1X/week     Co-evaluation               AM-PAC PT "6 Clicks" Mobility  Outcome Measure Help needed turning from your back to your side while in a flat bed without using bedrails?: None Help needed moving from lying on your back to sitting on the side of a flat bed without using bedrails?: A Little Help needed moving to and from a bed to a chair (including a wheelchair)?: A Little Help needed standing up from a chair using your arms (e.g., wheelchair or bedside chair)?: A Little Help needed to walk in hospital room?: A Little Help needed climbing 3-5 steps with a railing? : A Lot 6 Click Score: 18    End of Session Equipment Utilized During Treatment: Gait belt;Oxygen Activity Tolerance: Patient limited by fatigue Patient left: in chair;with call bell/phone within reach;with chair alarm set;with family/visitor present;with nursing/sitter in room Nurse Communication: Mobility status PT Visit Diagnosis: Unsteadiness on feet (R26.81);Muscle weakness (generalized) (M62.81);Difficulty in walking, not elsewhere classified (R26.2)    Time: 1610-9604 PT Time Calculation (min) (ACUTE ONLY): 14 min   Charges:   PT Evaluation $PT Eval Low Complexity: 1 Low   PT General Charges $$ ACUTE PT VISIT: 1 Visit         Kathlyn Sacramento, PT, DPT [facility] Physical Therapist Office: [phone] Website: [email or web address]   Berton Mount 09/08/2023, 1:17 PM

## 2023-09-08 NOTE — Progress Notes (Signed)
Received message from RN regarding patient's code status. Patient clarified with me and RN present that he has an advanced directive that specifies DNR. Patient verbalizes he elects to be DNR. Clarified regarding intubation status and he agrees to re-intubation if necessary.   -Code Status changed to reflect patient's wishes: DNR-Interventions

## 2023-09-08 NOTE — Progress Notes (Signed)
eLink Physician-Brief Progress Note Patient Name: BO TEICHER DOB: November 02, 1955 MRN: [id number]   Date of Service  09/08/2023  HPI/Events of Note  71M smoker with COPD, HTN and carotid artery disease who is admitted for acute on chronic hypercapnic respiratory failure in setting of influenza A infection.  Transferred to the floor  Continues to have severe anxiety and feels that between alprazolam and hydroxyzine, he had better effect with alprazolam.  eICU Interventions  Alprazolam twice daily as needed for 2 doses, consider initiating long-term anxiolytics     Intervention Category Minor Interventions: Agitation / anxiety - evaluation and management  Jaylen Claude 09/08/2023, 8:53 PM

## 2023-09-08 NOTE — Progress Notes (Addendum)
NAME:  Jackson Mathews, MRN:  [id number], DOB:  1956/05/29, LOS: 2 ADMISSION DATE:  09/05/2023, CONSULTATION DATE:  09/05/23 REFERRING MD:  Deretha Emory CHIEF COMPLAINT:  Respiratory Distress  History of Present Illness:  Pt is encephelopathic; therefore, this HPI is obtained from chart review. Jackson Mathews is a 68 y.o. male who has a PMH as below including but not limited to COPD/emphysema (no PFTs in system but CXR does show emphysema), HTN, HLD, carotid artery occlusion, CAD 2017.  He presented to [facility] 2/11 after being diagnosed 1 day prior with influenza A with home test per sisters report (he was started on Tamiflu by PCP). On 2/11, he developed worsening symptoms including respiratory distress prompting him to drive to the ED. In ED, he had AMS and was in significant distress to the point he required intubation after failing BiPAP trial (had worsened ABG with worsening hypercapnia). He was intubated and was later transferred to [facility] for further evaluation.   Head CT was negative. CXR showed possible RUL infiltrate. He was started on Steroids, Ceftriaxone and Azithromycin and had cultures drawn. Of note, Flu/RSV/COVID were negative in ED though he reportedly did test positive for flu A 2/10 with home test (and started on Tamiflu by PCP as mentioned above).  Pertinent  Medical History  has Hearing loss; DYSPNEA; SKIN CANCER, HX OF; Hx of colonic polyp - ssp; Peripheral vascular disease (HCC); Carotid artery disease (HCC); History of right-sided carotid endarterectomy; Nicotine dependence with current use; CAD in native artery-cath 09/24/15 non obstructive; Essential hypertension; HLD (hyperlipidemia); Lower urinary tract symptoms (LUTS); Systolic murmur; Actinic keratosis; Hypoxia; Tobacco use; Bronchitis due to tobacco use; Hyponatremia; Leukocytosis; Carotid artery stenosis, symptomatic, right; and Respiratory failure (HCC) on their problem list.  Significant Hospital Events: Including  procedures, antibiotic start and stop dates in addition to other pertinent events   2/11 intubated, admit  2/12 tolerated SBT 2/13 tolerated SBT  Interim History / Subjective:  Awake eating breakfast. Some SOB but otherwise states feeling improved.   Objective   Blood pressure (!) 168/81, pulse 91, temperature 98.1 F (36.7 C), temperature source Oral, resp. rate (!) 23, height 5' 9.02" (1.753 m), weight 61.3 kg, SpO2 95%.    Vent Mode: PSV;CPAP FiO2 (%):  [30 %] 30 % Set Rate:  [24 bmp] 24 bmp Vt Set:  [560 mL] 560 mL PEEP:  [5 cmH20] 5 cmH20 Pressure Support:  [5 cmH20] 5 cmH20 Plateau Pressure:  [33 cmH20] 33 cmH20   Intake/Output Summary (Last 24 hours) at 09/08/2023 0640 Last data filed at 09/08/2023 0400 Gross per 24 hour  Intake 1036.66 ml  Output 3835 ml  Net -2798.34 ml   Filed Weights   09/06/23 0500 09/07/23 0500 09/08/23 0430  Weight: 62.6 kg 62.6 kg 61.3 kg    Examination: General: awake/alert, sitting up in bed, in NAD HENT: [place] in place Lungs: normal work of effort on [place], mild wheezing bilaterally Cardiovascular: RRR Abdomen: bowel sounds present, soft, non-distended Extremities: no LE edema Neuro: alert and oriented x 3, no focal deficits   Resolved Hospital Problem list     Assessment & Plan:  Acute hypoxic and hypercapnic respiratory failure requiring mechanical ventilation 2/2 AECOPD  Hx COPD/emphysema (no PFT's on file). Ongoing tobacco dependence. ABG improved. Lactic acidosis resolved. Full RVP negative. Extubated on 2/13 to [place].  - Continue CTX x 5 days / Azithromycin x 3 days - Solumedrol 40 mg daily x  5 days  - Bcx: no growth so  far - Albuterol, DuoNebs - Can try to get him established in our office as an outpatient for outpatient follow up. - Tobacco cessation counseling, nicotine patch given    Possible Flu A - reportedly tested positive at home 2/10 but negative in ED 2/11. Full RVP negative. Droplet precautions.  - Continue empiric  Tamiflu x 5 days  Hyponatremia, resolved Acute, improved to normal. No hx of hyponatremia. Urine Osm and Na suggest hypovolemia. Volume down given IVF.   - Trend BMP    HTN  HLD, CAD, carotid artery occlusion s/p right CAE 2017. - BP elevated today - Restart PTA Amlodipine - Add PRN hydralazine for SBP >160 - Continue PTA ASA, Clopidogrel, Atorvastatin.   Hyperglycemia. - Hgb A1c 5.4 - SSI   Best Practice (right click and "Reselect all SmartList Selections" daily)   Diet/type: Regular consistency (see orders) and NPO DVT prophylaxis LMWH Pressure ulcer(s): N/A GI prophylaxis: PPI Lines: N/A Foley:  Yes, and it is still needed Code Status:  full code Last date of multidisciplinary goals of care discussion [updated sister today]  Labs   CBC: Recent Labs  Lab 09/05/23 1915 09/05/23 1933 09/05/23 2141 09/06/23 0108 09/06/23 0832 09/07/23 0223  WBC 10.9*  --   --   --  10.1 7.4  NEUTROABS 8.3*  --   --   --   --   --   HGB 15.1 16.3 15.3 14.3 13.1 11.4*  HCT 46.4 48.0 45.0 42.0 38.6* 33.9*  MCV 90.1  --   --   --  87.5 88.3  PLT 332  --   --   --  265 241    Basic Metabolic Panel: Recent Labs  Lab 09/05/23 1915 09/05/23 1933 09/06/23 0832 09/06/23 1150 09/06/23 2006 09/07/23 0223 09/07/23 1229 09/07/23 1746 09/07/23 2332  NA 121*   < > 123*   < > 125* 126* 130* 135 137  K 3.5   < > 5.1   < > 4.9 4.5 4.6 5.0 4.5  CL 79*  --  84*   < > 86* 88* 89* 94* 95*  CO2 28  --  28   < > 28 29 29  33* 33*  GLUCOSE 284*  --  127*   < > 88 122* 123* 118* 100*  BUN 14  --  18   < > 16 19 20 17 15   CREATININE 0.74  --  0.79   < > 0.73 0.85 0.80 0.78 0.62  CALCIUM 9.2  --  8.4*   < > 8.4* 8.5* 9.0 9.1 8.6*  MG 1.9  --  2.4  --   --   --   --   --   --   PHOS  --   --  2.7  --   --   --   --   --   --    < > = values in this interval not displayed.   GFR: Estimated Creatinine Clearance: 78.8 mL/min (by C-G formula based on SCr of 0.62 mg/dL). Recent Labs  Lab  09/05/23 1915 09/05/23 1931 09/06/23 0832 09/07/23 0223  WBC 10.9*  --  10.1 7.4  LATICACIDVEN  --  4.8* 1.8  --     Liver Function Tests: Recent Labs  Lab 09/05/23 1915  AST 40  ALT 26  ALKPHOS 96  BILITOT 0.9  PROT 7.0  ALBUMIN 4.0   No results for input(s): "LIPASE", "AMYLASE" in the last 168 hours. No results for input(s): "AMMONIA"  in the last 168 hours.  ABG    Component Value Date/Time   PHART 7.277 (L) 09/06/2023 0108   PCO2ART 75.2 (HH) 09/06/2023 0108   PO2ART 289 (H) 09/06/2023 0108   HCO3 35.3 (H) 09/06/2023 0108   TCO2 38 (H) 09/06/2023 0108   ACIDBASEDEF 2.0 09/05/2023 1933   O2SAT 100 09/06/2023 0108     Coagulation Profile: No results for input(s): "INR", "PROTIME" in the last 168 hours.  Cardiac Enzymes: No results for input(s): "CKTOTAL", "CKMB", "CKMBINDEX", "TROPONINI" in the last 168 hours.  HbA1C: Hgb A1c MFr Bld  Date/Time Value Ref Range Status  09/06/2023 08:32 AM 5.4 4.8 - 5.6 % Final    Comment:    (NOTE) Pre diabetes:          5.7%-6.4%  Diabetes:              >6.4%  Glycemic control for   <7.0% adults with diabetes   09/24/2015 04:00 AM 5.6 4.8 - 5.6 % Final    Comment:    (NOTE)         Pre-diabetes: 5.7 - 6.4         Diabetes: >6.4         Glycemic control for adults with diabetes: <7.0     CBG: Recent Labs  Lab 09/07/23 1136 09/07/23 1553 09/07/23 1959 09/07/23 2331 09/08/23 0352  GLUCAP 140* 114* 160* 102* 98    Review of Systems:   Unable to obtain. Sedated.  Past Medical History:  He,  has a past medical history of CAD in native artery-cath 09/24/15 non obstructive (09/24/2015), Calf pain, Cancer (HCC), Carotid artery occlusion, Chest pain, COPD (chronic obstructive pulmonary disease) (HCC), Dyspnea, Hearing loss, Heart murmur, History of kidney stones, History of right-sided carotid endarterectomy (09/23/2015), Hx of colonic polyp - ssp (06/21/2015), Hypercholesteremia, Hypertension, Hypoxia (10/12/2018),  Lower back pain, Palpitations, Tinnitus, Tobacco use (09/23/2015), and Vertigo.   Surgical History:   Past Surgical History:  Procedure Laterality Date   CARDIAC CATHETERIZATION N/A 09/24/2015   Procedure: Left Heart Cath and Coronary Angiography;  Surgeon: Kathleene Hazel, MD;  Location: [facility] INVASIVE CV LAB;  Service: Cardiovascular;  Laterality: N/A;   CAROTID ENDARTERECTOMY  05/05/11   Right CEA   MELANOMA EXCISION     l eye   SKIN CANCER EXCISION     TRANSCAROTID ARTERY REVASCULARIZATION  Right 06/28/2021   Procedure: RIGHT TRANSCAROTID ARTERY REVASCULARIZATION;  Surgeon: Victorino Sparrow, MD;  Location: [facility] OR;  Service: Vascular;  Laterality: Right;     Social History:   reports that he has been smoking cigarettes. He has a 60 pack-year smoking history. He has never used smokeless tobacco. He reports that he does not drink alcohol and does not use drugs.   Family History:  His family history includes Cancer in his father; Diabetes in his brother; Heart attack in his father; Heart disease in his mother. There is no history of Colon cancer, Esophageal cancer, Ulcerative colitis, Stomach cancer, or Rectal cancer.   Allergies Allergies  Allergen Reactions   Codeine Nausea Only     Home Medications  Prior to Admission medications   Medication Sig Start Date End Date Taking? Authorizing Provider  albuterol (VENTOLIN HFA) 108 (90 Base) MCG/ACT inhaler INHALE 2 PUFFS BY MOUTH EVERY 6 HOURS INTO THE LUNGS AS NEEDED FOR WHEEZING OR SHORTNESS OF BREATH 12/22/20   Hilts, Casimiro Needle, MD  amLODipine (NORVASC) 5 MG tablet Take 5 mg by mouth daily.    [provider]  aspirin EC 81 MG tablet Take 1 tablet (81 mg total) by mouth daily. Swallow whole. 06/12/21   Victorino Sparrow, MD  atorvastatin (LIPITOR) 40 MG tablet Take 1 tablet (40 mg total) by mouth daily. 06/11/21   Setzer, Lynnell Jude, PA-C  clopidogrel (PLAVIX) 75 MG tablet Take 1 tablet (75 mg total) by mouth daily. 06/11/21    Setzer, Lynnell Jude, PA-C  ipratropium-albuterol (DUONEB) 0.5-2.5 (3) MG/3ML SOLN Take 3 mLs by nebulization every 6 (six) hours as needed (Shortness of breath or wheezing). 10/14/18   Clydie Braun, MD  oxyCODONE-acetaminophen (PERCOCET/ROXICET) 5-325 MG tablet Take 1 tablet by mouth every 6 (six) hours as needed for moderate pain. Patient not taking: Reported on 08/19/2022 06/29/21   Graceann Congress, PA-C  Polyethyl Glycol-Propyl Glycol (SYSTANE OP) Place 1 drop into both eyes daily as needed (dry eyes).    [provider]  Respiratory Therapy Supplies (NEBULIZER) DEVI 1 Device by Does not apply route as needed. 10/14/18   Clydie Braun, MD  sildenafil (VIAGRA) 50 MG tablet Take 1 tablet (50 mg total) by mouth daily as needed for erectile dysfunction. 05/11/20   Hilts, Casimiro Needle, MD     Critical care time:

## 2023-09-09 DIAGNOSIS — I2510 Atherosclerotic heart disease of native coronary artery without angina pectoris: Secondary | ICD-10-CM | POA: Diagnosis not present

## 2023-09-09 DIAGNOSIS — J101 Influenza due to other identified influenza virus with other respiratory manifestations: Secondary | ICD-10-CM | POA: Diagnosis not present

## 2023-09-09 DIAGNOSIS — J9602 Acute respiratory failure with hypercapnia: Secondary | ICD-10-CM

## 2023-09-09 DIAGNOSIS — J441 Chronic obstructive pulmonary disease with (acute) exacerbation: Secondary | ICD-10-CM | POA: Diagnosis not present

## 2023-09-09 DIAGNOSIS — J9601 Acute respiratory failure with hypoxia: Secondary | ICD-10-CM | POA: Diagnosis not present

## 2023-09-09 LAB — GLUCOSE, CAPILLARY
Glucose-Capillary: 157 mg/dL — ABNORMAL HIGH (ref 70–99)
Glucose-Capillary: 128 mg/dL — ABNORMAL HIGH (ref 70–99)
Glucose-Capillary: 208 mg/dL — ABNORMAL HIGH (ref 70–99)

## 2023-09-09 LAB — CBC
HCT: 47.5 % (ref 39.0–52.0)
Hemoglobin: 15.7 g/dL (ref 13.0–17.0)
MCH: 29.8 pg (ref 26.0–34.0)
MCHC: 33.1 g/dL (ref 30.0–36.0)
MCV: 90.3 fL (ref 80.0–100.0)
Platelets: 337 10*3/uL (ref 150–400)
RBC: 5.26 MIL/uL (ref 4.22–5.81)
RDW: 12.9 % (ref 11.5–15.5)
WBC: 12.1 10*3/uL — ABNORMAL HIGH (ref 4.0–10.5)
nRBC: 0 % (ref 0.0–0.2)

## 2023-09-09 LAB — BASIC METABOLIC PANEL
Anion gap: 12 (ref 5–15)
BUN: 15 mg/dL (ref 8–23)
CO2: 32 mmol/L (ref 22–32)
Calcium: 8.8 mg/dL — ABNORMAL LOW (ref 8.9–10.3)
Chloride: 94 mmol/L — ABNORMAL LOW (ref 98–111)
Creatinine, Ser: 0.71 mg/dL (ref 0.61–1.24)
GFR, Estimated: >60 mL/min (ref >60)
Glucose, Bld: 80 mg/dL (ref 70–99)
Potassium: 4 mmol/L (ref 3.5–5.1)
Sodium: 138 mmol/L (ref 135–145)

## 2023-09-09 LAB — MAGNESIUM: Magnesium: 2 mg/dL (ref 1.7–2.4)

## 2023-09-09 LAB — TRIGLYCERIDES: Triglycerides: 90 mg/dL (ref <150)

## 2023-09-09 MED ORDER — BUDESONIDE 0.5 MG/2ML IN SUSP
0.5000 mg | Freq: Two times a day (BID) | RESPIRATORY_TRACT | Status: DC
  Administered 2023-09-09 – 2023-09-12 (×5): 0.5 mg via RESPIRATORY_TRACT
  Filled 2023-09-09 (×5): qty 2

## 2023-09-09 MED ORDER — AMLODIPINE BESYLATE 10 MG PO TABS
10.0000 mg | ORAL_TABLET | Freq: Every day | ORAL | Status: DC
  Administered 2023-09-09 – 2023-09-12 (×4): 10 mg via ORAL
  Filled 2023-09-09 (×4): qty 1

## 2023-09-09 MED ORDER — ALBUTEROL SULFATE (2.5 MG/3ML) 0.083% IN NEBU: 2.5000 mg | INHALATION_SOLUTION | Freq: Four times a day (QID) | RESPIRATORY_TRACT | Status: DC | PRN

## 2023-09-09 MED ORDER — METOPROLOL TARTRATE 5 MG/5ML IV SOLN
5.0000 mg | Freq: Once | INTRAVENOUS | Status: AC
  Administered 2023-09-09: 5 mg via INTRAVENOUS
  Filled 2023-09-09: qty 5

## 2023-09-09 MED ORDER — FLUTICASONE PROPIONATE 50 MCG/ACT NA SUSP
2.0000 | Freq: Every day | NASAL | Status: DC
  Administered 2023-09-09 – 2023-09-12 (×4): 2 via NASAL
  Filled 2023-09-09: qty 16

## 2023-09-09 MED ORDER — LORATADINE 10 MG PO TABS
10.0000 mg | ORAL_TABLET | Freq: Every day | ORAL | Status: DC
  Administered 2023-09-09 – 2023-09-12 (×4): 10 mg via ORAL
  Filled 2023-09-09 (×4): qty 1

## 2023-09-09 MED ORDER — PANTOPRAZOLE SODIUM 40 MG PO TBEC
40.0000 mg | DELAYED_RELEASE_TABLET | Freq: Every day | ORAL | Status: DC
  Administered 2023-09-09 – 2023-09-12 (×4): 40 mg via ORAL
  Filled 2023-09-09 (×4): qty 1

## 2023-09-09 MED ORDER — AZITHROMYCIN 500 MG PO TABS
250.0000 mg | ORAL_TABLET | Freq: Every day | ORAL | Status: AC
  Administered 2023-09-09 – 2023-09-10 (×2): 250 mg via ORAL
  Filled 2023-09-09 (×2): qty 1

## 2023-09-09 MED ORDER — IPRATROPIUM-ALBUTEROL 0.5-2.5 (3) MG/3ML IN SOLN: 3.0000 mL | Freq: Three times a day (TID) | RESPIRATORY_TRACT | Status: DC

## 2023-09-09 MED ORDER — IPRATROPIUM-ALBUTEROL 0.5-2.5 (3) MG/3ML IN SOLN
3.0000 mL | Freq: Two times a day (BID) | RESPIRATORY_TRACT | Status: DC
  Administered 2023-09-09: 3 mL via RESPIRATORY_TRACT
  Filled 2023-09-09: qty 3

## 2023-09-09 MED ORDER — LOSARTAN POTASSIUM 25 MG PO TABS
25.0000 mg | ORAL_TABLET | Freq: Every day | ORAL | Status: DC
  Administered 2023-09-09 – 2023-09-10 (×2): 25 mg via ORAL
  Filled 2023-09-09 (×2): qty 1

## 2023-09-09 MED ORDER — ARFORMOTEROL TARTRATE 15 MCG/2ML IN NEBU
15.0000 ug | INHALATION_SOLUTION | Freq: Two times a day (BID) | RESPIRATORY_TRACT | Status: DC
  Administered 2023-09-09 – 2023-09-12 (×5): 15 ug via RESPIRATORY_TRACT
  Filled 2023-09-09 (×5): qty 2

## 2023-09-09 NOTE — Evaluation (Signed)
 Occupational Therapy Evaluation Patient Details Name: Jackson Mathews MRN: [id number] DOB: [birth year] Today's Date: 09/09/2023   History of Present Illness   68 y.o. male admitted 2/11 with respiratory distress, after  being diagnosed with influenza A by his primary care doctor 2/10 and started on Tamiflu. Required intubation after failing BiPAP trial. Extubated 2/13. PMHx: Hearing loss; DYSPNEA; SKIN CANCER, HX OF; Hx of colonic polyp - ssp; Peripheral vascular disease; Carotid artery disease; History of right-sided carotid endarterectomy; Nicotine dependence with current use; CAD in native artery-cath 09/24/15 non obstructive; Essential hypertension; HLD (hyperlipidemia); Lower urinary tract symptoms (LUTS); Systolic murmur; Actinic keratosis; Hypoxia; Tobacco use; Bronchitis due to tobacco use; Hyponatremia; Leukocytosis; Carotid artery stenosis, symptomatic, right; and Respiratory failure on their problem list.     Clinical Impressions At baseline, pt is Independent with ADLs, IADLs, and functional mobility without an AD. Pt now presents with decreased activity tolerance, increased fatigue, decreased B UE strength, decreased balance, impaired cardiopulmonary status, and decreased safety and independence with functional tasks. Pt currently demonstrates ability to complete UB ADLs Independent to Contact guard assist, LB ADLs with Set up to Min assist, and functional step-pivot transfers with HHA +1 with Contact guard assist. Pt participated well in session and is motivated to return to PLOF. VSS on 4L continuous O2 through nasal cannula with HR in the upper-90s up to 103 bpm, O2 sat >/ 93%, and BP mildly elevated but stable throughout. Pt will benefit from acute skilled OT services to address deficits outlined below and to increase safety and independence with functional tasks. No post-acute skilled OT needs are anticipated at this time. Discharge plan to be updated as needed based on pt progress in  acute skilled OT.      If plan is discharge home, recommend the following:   A little help with walking and/or transfers;A little help with bathing/dressing/bathroom;Assistance with cooking/housework;Assist for transportation;Help with stairs or ramp for entrance     Functional Status Assessment   Patient has had a recent decline in their functional status and demonstrates the ability to make significant improvements in function in a reasonable and predictable amount of time.     Equipment Recommendations   BSC/3in1     Recommendations for Other Services         Precautions/Restrictions   Precautions Precautions: Fall Recall of Precautions/Restrictions: Intact Precaution/Restrictions Comments: Monitor O2, BP, and HR Restrictions Weight Bearing Restrictions Per Provider Order: No     Mobility Bed Mobility Overal bed mobility: Needs Assistance Bed Mobility: Supine to Sit     Supine to sit: Contact guard, HOB elevated     General bed mobility comments: CGA for safety, slower to rise.    Transfers Overall transfer level: Needs assistance Equipment used: 1 person hand held assist Transfers: Sit to/from Stand, Bed to chair/wheelchair/BSC Sit to Stand: Contact guard assist     Step pivot transfers: Contact guard assist     General transfer comment: CGA for safety to stand, reaching for arm rest on chair. CGA for step pivot to chair. Cues for technique.      Balance Overall balance assessment: Needs assistance Sitting-balance support: No upper extremity supported, Feet supported Sitting balance-Leahy Scale: Fair     Standing balance support: Single extremity supported, During functional activity Standing balance-Leahy Scale: Poor Standing balance comment: reliant on therapist to maintain balance  ADL either performed or assessed with clinical judgement   ADL Overall ADL's : Needs  assistance/impaired Eating/Feeding: Independent;Sitting   Grooming: Set up;Sitting   Upper Body Bathing: Set up;Sitting   Lower Body Bathing: Contact guard assist;Minimal assistance;Sit to/from stand   Upper Body Dressing : Contact guard assist;Sitting Upper Body Dressing Details (indicate cue type and reason): assist with managing telemetry and O2 cords; otherwise Set up Lower Body Dressing: Contact guard assist;Sit to/from stand   Toilet Transfer: Contact guard assist;BSC/3in1 (step-pivot transfer; HHA +1)   Toileting- Clothing Manipulation and Hygiene: Set up;Sitting/lateral lean;Contact guard assist;Sit to/from stand       Functional mobility during ADLs:  (deferred this session due to pt fatigue) General ADL Comments: Pt with decreased activity tolerance, fatiguing quickly during tasks     Vision Baseline Vision/History: 1 Wears glasses Ability to See in Adequate Light: 0 Adequate (with glasses) Patient Visual Report: No change from baseline       Perception         Praxis         Pertinent Vitals/Pain Pain Assessment Pain Assessment: No/denies pain     Extremity/Trunk Assessment Upper Extremity Assessment Upper Extremity Assessment: Right hand dominant;Generalized weakness   Lower Extremity Assessment Lower Extremity Assessment: Defer to PT evaluation       Communication Communication Communication: No apparent difficulties Factors Affecting Communication: Hearing impaired   Cognition Arousal: Alert Behavior During Therapy: WFL for tasks assessed/performed, Anxious (Largely WFL with mild signs of anxiety noted intermittently throughout session) Cognition: No apparent impairments             OT - Cognition Comments: AAOx4 and pleasant throughout session. Cognition largely [facility] for tasks assessed with pt requiring mildly increased time for processing. Suspect this is at least partially due to pt being [facility].                 Following commands:  Impaired Following commands impaired: Only follows one step commands consistently, Follows one step commands inconsistently, Follows multi-step commands inconsistently     Cueing  General Comments   Cueing Techniques: Verbal cues;Gestural cues;Visual cues   VSS on 4L continuous O2 through nasal cannula throughout session with HR in the upper-90s up to 103 bpm, O2 sat >/ 93%, and BP mildly elevated but stable throughout in supine and sitting.    Exercises     Shoulder Instructions      Home Living Family/patient expects to be discharged to:: Private residence Living Arrangements: Alone Available Help at Discharge: Family;Available PRN/intermittently Type of Home: House Home Access: Stairs to enter [employer] of Steps: 2 Entrance Stairs-Rails: Right;Left Home Layout: One level     Bathroom Shower/Tub: Chief Strategy Officer: Standard Bathroom Accessibility: No   Home Equipment: None          Prior Functioning/Environment Prior Level of Function : Independent/Modified Independent;Driving             Mobility Comments: Indpendent without an AD ADLs Comments: Independent with ADLs and IADLs; drives; used to work in [employer] and for [employer]; enjoys fishing    OT Problem List: Decreased strength;Decreased activity tolerance;Impaired balance (sitting and/or standing);Cardiopulmonary status limiting activity   OT Treatment/Interventions: Self-care/ADL training;Therapeutic exercise;Energy conservation;DME and/or AE instruction;Therapeutic activities;Patient/family education;Balance training      OT Goals(Current goals can be found in the care plan section)   Acute Rehab OT Goals Patient Stated Goal: to feel better, have more energy, and return home OT Goal  Formulation: With patient Time For Goal Achievement: 09/23/23 Potential to Achieve Goals: Good ADL Goals Pt Will Perform Grooming: with modified independence;standing Pt  Will Perform Lower Body Bathing: with modified independence;sitting/lateral leans;sit to/from stand Pt Will Perform Lower Body Dressing: with modified independence;sit to/from stand;sitting/lateral leans Pt Will Transfer to Toilet: with modified independence;ambulating;regular height toilet (with least restrictive AD) Pt Will Perform Toileting - Clothing Manipulation and hygiene: with modified independence;sit to/from stand;sitting/lateral leans Pt/caregiver will Perform Home Exercise Program: Increased strength;Both right and left upper extremity;With theraband;Independently;With written HEP provided (Increased activity tolerance) Additional ADL Goal #1: Patient will demonstrate ability to Independently state 3 energy conservation strategies to increase safety and independence with functional tasks.   OT Frequency:  Min 1X/week    Co-evaluation              AM-PAC OT "6 Clicks" Daily Activity     Outcome Measure Help from another person eating meals?: None Help from another person taking care of personal grooming?: A Little Help from another person toileting, which includes using toliet, bedpan, or urinal?: A Little Help from another person bathing (including washing, rinsing, drying)?: A Little Help from another person to put on and taking off regular upper body clothing?: A Little Help from another person to put on and taking off regular lower body clothing?: A Little 6 Click Score: 19   End of Session Equipment Utilized During Treatment: Oxygen Nurse Communication: Mobility status;Other (comment) (vital signs; pt participated well)  Activity Tolerance: Patient tolerated treatment well Patient left: in chair;with call bell/phone within reach;with chair alarm set  OT Visit Diagnosis: Unsteadiness on feet (R26.81);Other abnormalities of gait and mobility (R26.89);Other (comment);Muscle weakness (generalized) (M62.81) (decreased activity tolerance)                Time:  1610-9604 OT Time Calculation (min): 25 min Charges:  OT General Charges $OT Visit: 1 Visit OT Evaluation $OT Eval Low Complexity: 1 Low OT Treatments $Self Care/Home Management : 8-22 mins  Tyiesha Brackney "Orson Eva., OTR/L, MA Acute Rehab [phone]  Lendon Colonel 09/09/2023, 4:08 PM

## 2023-09-09 NOTE — Progress Notes (Signed)
 PROGRESS NOTE    [name]  ZOX:[id number] DOB: 1956/05/13 DOA: 09/05/2023 PCP: Jackson Mesi, MD    Chief Complaint  Patient presents with   Chest Pain    Brief Narrative:  Per HPI Pt is encephelopathic; therefore, this HPI is obtained from chart review. Jackson Mathews is a 68 y.o. male who has a PMH as below including but not limited to COPD/emphysema (no PFTs in system but CXR does show emphysema), HTN, HLD, carotid artery occlusion, CAD 2017.   He presented to [facility] 2/11 after being diagnosed 1 day prior with influenza A with home test per sisters report (he was started on Tamiflu by PCP). On 2/11, he developed worsening symptoms including respiratory distress prompting him to drive to the ED. In ED, he had AMS and was in significant distress to the point he required intubation after failing BiPAP trial (had worsened ABG with worsening hypercapnia). He was intubated and was later transferred to [facility] for further evaluation.   Head CT was negative. CXR showed possible RUL infiltrate. He was started on Steroids, Ceftriaxone and Azithromycin and had cultures drawn. Of note, Flu/RSV/COVID were negative in ED though he reportedly did test positive for flu A 2/10 with home test (and started on Tamiflu by PCP as mentioned above).   Assessment & Plan:   Principal Problem:   Respiratory failure with hypoxia and hypercapnia (HCC) Active Problems:   Nicotine dependence with current use   CAD in native artery-cath 09/24/15 non obstructive   Essential hypertension   HLD (hyperlipidemia)   Tobacco use   Respiratory failure (HCC)   COPD with acute exacerbation (HCC)   Influenza A  #1 acute hypercapnic respiratory failure in the setting of influenza A and COPD exacerbation -Status post mechanical ventilation currently extubated and on 4 L nasal cannula. -Patient noted to have presented to the ED with worsening symptoms of shortness of breath, cough noted to be in respiratory  distress also noted to have altered mental status requiring intubation after failing BiPAP. -It is noted that patient had tested positive with influenza A with home test per sister's report. -CT head done on admission negative for any acute abnormalities. -Respiratory viral panel negative.  Blood cultures with no growth to date x 4 days.  MRSA PCR negative. -SARS coronavirus 2 PCR negative. Influenza A by PCR negative, influenza B by PCR negative, RSV by PCR negative. -Patient visibly short of breath. -Change DuoNebs to 3 times daily. -Start Pulmicort and Brovana nebs. -Start Claritin, PPI, Flonase. -Continue IV Solu-Medrol 40 mg daily. -Continue Tamiflu. -Supportive care.  2.  Probable influenza A -Patient noted to have tested positive at home 2/10 but negative in the ED 2/11. -Respiratory viral panel negative. -Continue empiric Tamiflu x 5 days. -Droplet precautions.  3.  Hyponatremia -Resolved. -Urine osmolality and urine sodium suggesting hypovolemia and improved with hydration.  4.  Hypertension -Patient noted to be on amlodipine prior to admission and will uptitrate to 10 mg daily. -Start Cozaar 25 mg daily. -IV hydralazine as needed.  5.  Hyperlipidemia -Continue statin.  6.  CAD, carotid artery occlusion status post right CEA 2017 -Continue home antihypertensive medications, as needed hydralazine. -Continue aspirin, clopidogrel, statin.  7.  Hyperglycemia -Likely secondary to steroids. -Hemoglobin A1c 5.4. -CBG 157. -SSI.  8.  Tobacco abuse -Tobacco cessation stressed to patient. -Nicotine patch.   DVT prophylaxis: Lovenox Code Status: DNR with interventions Family Communication: Updated patient and sister at bedside. Disposition: Home when clinically improved.  Status is: Inpatient Remains inpatient appropriate because: Severity of illness   Consultants:  PCCM admission  Procedures:  CT head 09/05/2023 Chest x-ray 09/05/2023, 09/06/2023 2D echo  09/06/2023  Significant Hospital Events: Including procedures, antibiotic start and stop dates in addition to other pertinent events   2/11 intubated, admit  2/12 tolerated SBT 2/13 tolerated SBT  Antimicrobials:  Anti-infectives (From admission, onward)    Start     Dose/Rate Route Frequency Ordered Stop   09/09/23 1000  azithromycin (ZITHROMAX) tablet 250 mg        250 mg Oral Daily 09/09/23 0758 09/11/23 0959   09/07/23 2200  oseltamivir (TAMIFLU) capsule 75 mg        75 mg Oral 2 times daily 09/07/23 1511 09/12/23 2159   09/07/23 0200  cefTRIAXone (ROCEPHIN) 2 g in sodium chloride 0.9 % 100 mL IVPB        2 g 200 mL/hr over 30 Minutes Intravenous Every 24 hours 09/06/23 1623 09/11/23 0159   09/06/23 0130  oseltamivir (TAMIFLU) 6 MG/ML suspension 75 mg  Status:  Discontinued        75 mg Per Tube 2 times daily 09/05/23 2237 09/07/23 1511   09/05/23 2330  cefTRIAXone (ROCEPHIN) 1 g in sodium chloride 0.9 % 100 mL IVPB  Status:  Discontinued        1 g 200 mL/hr over 30 Minutes Intravenous Every 24 hours 09/05/23 2239 09/06/23 1623   09/05/23 2245  azithromycin (ZITHROMAX) 500 mg in sodium chloride 0.9 % 250 mL IVPB        500 mg 250 mL/hr over 60 Minutes Intravenous Every 24 hours 09/05/23 2239 09/07/23 2217   09/05/23 2030  ceFEPIme (MAXIPIME) 2 g in sodium chloride 0.9 % 100 mL IVPB        2 g 200 mL/hr over 30 Minutes Intravenous  Once 09/05/23 2018 09/05/23 2104   09/05/23 2030  metroNIDAZOLE (FLAGYL) IVPB 500 mg        500 mg 100 mL/hr over 60 Minutes Intravenous  Once 09/05/23 2018 09/05/23 2235   09/05/23 2030  vancomycin (VANCOCIN) IVPB 1000 mg/200 mL premix        1,000 mg 200 mL/hr over 60 Minutes Intravenous  Once 09/05/23 2018 09/05/23 2344         Subjective: Laying in bed.  Looks visibly short of breath.  Patient states breathing has improved and feels much better since admission.  Denies any chest pain.  No abdominal pain.  Requesting for Foley catheter to be  removed.  Sister at bedside.  Patient on 4 L nasal cannula with sats of 93%.  Objective: Vitals:   09/09/23 1003 09/09/23 1105 09/09/23 1120 09/09/23 1122  BP: (!) 149/85 (!) 173/88    Pulse:      Resp:      Temp:  98.2 F (36.8 C)    TempSrc:  Oral    SpO2:  93% 93% 93%  Weight:      Height:        Intake/Output Summary (Last 24 hours) at 09/09/2023 1546 Last data filed at 09/09/2023 1106 Gross per 24 hour  Intake --  Output 2025 ml  Net -2025 ml   Filed Weights   09/08/23 0430 09/08/23 1513 09/09/23 0500  Weight: 61.3 kg 61.3 kg 63 kg    Examination:  General exam: Appears calm and comfortable  Respiratory system: Decreased breath sounds in the bases.  Minimal expiratory wheezing.  Some tightness.   Cardiovascular system:  Tachycardia.  No JVD, no murmurs rubs or gallops.  No lower extremity edema.  Gastrointestinal system: Abdomen is soft, nontender, nondistended, positive bowel sounds.  No rebound.  No guarding.  Central nervous system: Alert and oriented. No focal neurological deficits. Extremities: Symmetric 5 x 5 power. Skin: No rashes, lesions or ulcers Psychiatry: Judgement and insight appear normal. Mood & affect appropriate.     Data Reviewed: I have personally reviewed following labs and imaging studies  CBC: Recent Labs  Lab 09/05/23 1915 09/05/23 1933 09/06/23 0108 09/06/23 0832 09/07/23 0223 09/08/23 1053 09/09/23 0805  WBC 10.9*  --   --  10.1 7.4 11.3* 12.1*  NEUTROABS 8.3*  --   --   --   --   --   --   HGB 15.1   < > 14.3 13.1 11.4* 14.1 15.7  HCT 46.4   < > 42.0 38.6* 33.9* 42.9 47.5  MCV 90.1  --   --  87.5 88.3 90.7 90.3  PLT 332  --   --  265 241 307 337   < > = values in this interval not displayed.    Basic Metabolic Panel: Recent Labs  Lab 09/05/23 1915 09/05/23 1933 09/06/23 0832 09/06/23 1150 09/07/23 1229 09/07/23 1746 09/07/23 2332 09/08/23 1053 09/09/23 0805  NA 121*   < > 123*   < > 130* 135 137 138 138  K 3.5   <  > 5.1   < > 4.6 5.0 4.5 4.0 4.0  CL 79*  --  84*   < > 89* 94* 95* 90* 94*  CO2 28  --  28   < > 29 33* 33* 34* 32  GLUCOSE 284*  --  127*   < > 123* 118* 100* 130* 80  BUN 14  --  18   < > 20 17 15 11 15   CREATININE 0.74  --  0.79   < > 0.80 0.78 0.62 0.70 0.71  CALCIUM 9.2  --  8.4*   < > 9.0 9.1 8.6* 8.7* 8.8*  MG 1.9  --  2.4  --   --   --   --   --  2.0  PHOS  --   --  2.7  --   --   --   --   --   --    < > = values in this interval not displayed.    GFR: Estimated Creatinine Clearance: 80.9 mL/min (by C-G formula based on SCr of 0.71 mg/dL).  Liver Function Tests: Recent Labs  Lab 09/05/23 1915  AST 40  ALT 26  ALKPHOS 96  BILITOT 0.9  PROT 7.0  ALBUMIN 4.0    CBG: Recent Labs  Lab 09/08/23 0819 09/08/23 1152 09/08/23 1655 09/08/23 2044 09/09/23 1235  GLUCAP 112* 136* 134* 151* 157*     Recent Results (from the past 240 hours)  Culture, blood (Routine X 2) w Reflex to ID Panel     Status: None (Preliminary result)   Collection Time: 09/05/23  7:15 PM   Specimen: BLOOD  Result Value Ref Range Status   Specimen Description   Final    BLOOD LEFT ANTECUBITAL Performed at [facility], [address] [place], [place] [address]    Special Requests   Final    BOTTLES DRAWN AEROBIC AND ANAEROBIC Blood Culture adequate volume Performed at [facility], [address] [place], [place] [address]    Culture   Final  NO GROWTH 4 DAYS Performed at [facility], [address], [place], [place] [address]    Report Status PENDING  Incomplete  Culture, blood (Routine X 2) w Reflex to ID Panel     Status: None (Preliminary result)   Collection Time: 09/05/23  7:20 PM   Specimen: BLOOD  Result Value Ref Range Status   Specimen Description   Final    BLOOD RIGHT ANTECUBITAL Performed at [facility], [address] [place], [place] [address]    Special Requests   Final    BOTTLES DRAWN AEROBIC  AND ANAEROBIC Blood Culture adequate volume Performed at [facility], [address], [place], [place] [address]    Culture   Final    NO GROWTH 4 DAYS Performed at [facility], [address], [place], [place] [address]    Report Status PENDING  Incomplete  Resp panel by RT-PCR (RSV, Flu A&B, Covid) Anterior Nasal Swab     Status: None   Collection Time: 09/05/23  7:31 PM   Specimen: Anterior Nasal Swab  Result Value Ref Range Status   SARS Coronavirus 2 by RT PCR NEGATIVE NEGATIVE Final    Comment: (NOTE) SARS-CoV-2 target nucleic acids are NOT DETECTED.  The SARS-CoV-2 RNA is generally detectable in upper respiratory specimens during the acute phase of infection. The lowest concentration of SARS-CoV-2 viral copies this assay can detect is 138 copies/mL. A negative result does not preclude SARS-Cov-2 infection and should not be used as the sole basis for treatment or other patient management decisions. A negative result may occur with  improper specimen collection/handling, submission of specimen other than nasopharyngeal swab, presence of viral mutation(s) within the areas targeted by this assay, and inadequate number of viral copies(<138 copies/mL). A negative result must be combined with clinical observations, patient history, and epidemiological information. The expected result is Negative.  Fact Sheet for Patients:  BloggerCourse.com  Fact Sheet for Healthcare Providers:  SeriousBroker.it  This test is no t yet approved or cleared by the [place] FDA and  has been authorized for detection and/or diagnosis of SARS-CoV-2 by FDA under an Emergency Use Authorization (EUA). This EUA will remain  in effect (meaning this test can be used) for the duration of the COVID-19 declaration under Section 564(b)(1) of the Act, 21 U.S.C.section 360bbb-3(b)(1), unless the authorization is terminated  or  revoked sooner.       Influenza A by PCR NEGATIVE NEGATIVE Final   Influenza B by PCR NEGATIVE NEGATIVE Final    Comment: (NOTE) The Xpert Xpress SARS-CoV-2/FLU/RSV plus assay is intended as an aid in the diagnosis of influenza from Nasopharyngeal swab specimens and should not be used as a sole basis for treatment. Nasal washings and aspirates are unacceptable for Xpert Xpress SARS-CoV-2/FLU/RSV testing.  Fact Sheet for Patients: BloggerCourse.com  Fact Sheet for Healthcare Providers: SeriousBroker.it  This test is not yet approved or cleared by the [place] FDA and has been authorized for detection and/or diagnosis of SARS-CoV-2 by FDA under an Emergency Use Authorization (EUA). This EUA will remain in effect (meaning this test can be used) for the duration of the COVID-19 declaration under Section 564(b)(1) of the Act, 21 U.S.C. section 360bbb-3(b)(1), unless the authorization is terminated or revoked.     Resp Syncytial Virus by PCR NEGATIVE NEGATIVE Final    Comment: (NOTE) Fact Sheet for Patients: BloggerCourse.com  Fact Sheet for Healthcare Providers: SeriousBroker.it  This test is not yet approved or  cleared by the [place] and has been authorized for detection and/or diagnosis of SARS-CoV-2 by FDA under an Emergency Use Authorization (EUA). This EUA will remain in effect (meaning this test can be used) for the duration of the COVID-19 declaration under Section 564(b)(1) of the Act, 21 U.S.C. section 360bbb-3(b)(1), unless the authorization is terminated or revoked.  Performed at [facility], [address], [place], [place] [address]   MRSA Next Gen by PCR, Nasal     Status: None   Collection Time: 09/06/23 12:28 AM   Specimen: Nasal Mucosa; Nasal Swab  Result Value Ref Range Status   MRSA by PCR Next Gen NOT DETECTED NOT  DETECTED Final    Comment: (NOTE) The GeneXpert MRSA Assay (FDA approved for NASAL specimens only), is one component of a comprehensive MRSA colonization surveillance program. It is not intended to diagnose MRSA infection nor to guide or monitor treatment for MRSA infections. Test performance is not FDA approved in patients less than 19 years old. Performed at [facility], [address] [place], [place] [address]   Respiratory (~20 pathogens) panel by PCR     Status: None   Collection Time: 09/07/23  4:20 AM   Specimen: Nasopharyngeal Swab; Respiratory  Result Value Ref Range Status   Adenovirus NOT DETECTED NOT DETECTED Final   Coronavirus 229E NOT DETECTED NOT DETECTED Final    Comment: (NOTE) The Coronavirus on the Respiratory Panel, DOES NOT test for the novel  Coronavirus (2019 nCoV)    Coronavirus HKU1 NOT DETECTED NOT DETECTED Final   Coronavirus NL63 NOT DETECTED NOT DETECTED Final   Coronavirus OC43 NOT DETECTED NOT DETECTED Final   Metapneumovirus NOT DETECTED NOT DETECTED Final   Rhinovirus / Enterovirus NOT DETECTED NOT DETECTED Final   Influenza A NOT DETECTED NOT DETECTED Final   Influenza B NOT DETECTED NOT DETECTED Final   Parainfluenza Virus 1 NOT DETECTED NOT DETECTED Final   Parainfluenza Virus 2 NOT DETECTED NOT DETECTED Final   Parainfluenza Virus 3 NOT DETECTED NOT DETECTED Final   Parainfluenza Virus 4 NOT DETECTED NOT DETECTED Final   Respiratory Syncytial Virus NOT DETECTED NOT DETECTED Final   Bordetella pertussis NOT DETECTED NOT DETECTED Final   Bordetella Parapertussis NOT DETECTED NOT DETECTED Final   Chlamydophila pneumoniae NOT DETECTED NOT DETECTED Final   Mycoplasma pneumoniae NOT DETECTED NOT DETECTED Final    Comment: Performed at [facility], [address] [place], [place] [address]         Radiology Studies: No results found.      Scheduled Meds:  amLODipine  10 mg Oral Daily   arformoterol  15 mcg  Nebulization BID   aspirin  81 mg Oral Daily   atorvastatin  40 mg Oral Daily   azithromycin  250 mg Oral Daily   budesonide (PULMICORT) nebulizer solution  0.5 mg Nebulization BID   Chlorhexidine Gluconate Cloth  6 each Topical Daily   clopidogrel  75 mg Oral Daily   docusate sodium  100 mg Oral BID   enoxaparin (LOVENOX) injection  40 mg Subcutaneous Daily   fluticasone  2 spray Each Nare Daily   insulin aspart  0-9 Units Subcutaneous TID WC   loratadine  10 mg Oral Daily   losartan  25 mg Oral Daily   methylPREDNISolone (SOLU-MEDROL) injection  40 mg Intravenous Daily   nicotine  14 mg Transdermal Daily   oseltamivir  75 mg Oral BID   pantoprazole  40 mg Oral Q0600   Continuous Infusions:  cefTRIAXone (ROCEPHIN)  IV 2 g (09/09/23 0449)     LOS: 3 days    Time spent: 40 minutes    Ramiro Harvest, MD [facility]   To contact the attending provider between 7A-7P or the covering provider during after hours 7P-7A, please log into the web site [email or web address] and access using universal [place] password for that web site. If you do not have the password, please call the hospital operator.  09/09/2023, 3:46 PM

## 2023-09-10 DIAGNOSIS — I2510 Atherosclerotic heart disease of native coronary artery without angina pectoris: Secondary | ICD-10-CM | POA: Diagnosis not present

## 2023-09-10 DIAGNOSIS — J9601 Acute respiratory failure with hypoxia: Secondary | ICD-10-CM | POA: Diagnosis not present

## 2023-09-10 DIAGNOSIS — J441 Chronic obstructive pulmonary disease with (acute) exacerbation: Secondary | ICD-10-CM | POA: Diagnosis not present

## 2023-09-10 DIAGNOSIS — J101 Influenza due to other identified influenza virus with other respiratory manifestations: Secondary | ICD-10-CM | POA: Diagnosis not present

## 2023-09-10 LAB — GLUCOSE, CAPILLARY
Glucose-Capillary: 118 mg/dL — ABNORMAL HIGH (ref 70–99)
Glucose-Capillary: 138 mg/dL — ABNORMAL HIGH (ref 70–99)
Glucose-Capillary: 189 mg/dL — ABNORMAL HIGH (ref 70–99)
Glucose-Capillary: 166 mg/dL — ABNORMAL HIGH (ref 70–99)

## 2023-09-10 LAB — CBC WITH DIFFERENTIAL/PLATELET
Abs Immature Granulocytes: 0 10*3/uL (ref 0.00–0.07)
Basophils Absolute: 0 10*3/uL (ref 0.0–0.1)
Basophils Relative: 0 %
Eosinophils Absolute: 0 10*3/uL (ref 0.0–0.5)
Eosinophils Relative: 0 %
HCT: 44.8 % (ref 39.0–52.0)
Hemoglobin: 14.6 g/dL (ref 13.0–17.0)
Lymphocytes Relative: 28 %
Lymphs Abs: 1.9 10*3/uL (ref 0.7–4.0)
MCH: 29.4 pg (ref 26.0–34.0)
MCHC: 32.6 g/dL (ref 30.0–36.0)
MCV: 90.1 fL (ref 80.0–100.0)
Monocytes Absolute: 0.9 10*3/uL (ref 0.1–1.0)
Monocytes Relative: 13 %
Neutro Abs: 4 10*3/uL (ref 1.7–7.7)
Neutrophils Relative %: 59 %
Platelets: 308 10*3/uL (ref 150–400)
RBC: 4.97 MIL/uL (ref 4.22–5.81)
RDW: 12.8 % (ref 11.5–15.5)
WBC: 6.8 10*3/uL (ref 4.0–10.5)
nRBC: 0 % (ref 0.0–0.2)
nRBC: 0 /100{WBCs}

## 2023-09-10 LAB — RENAL FUNCTION PANEL
Albumin: 2.5 g/dL — ABNORMAL LOW (ref 3.5–5.0)
Anion gap: 11 (ref 5–15)
BUN: 18 mg/dL (ref 8–23)
CO2: 32 mmol/L (ref 22–32)
Calcium: 8.6 mg/dL — ABNORMAL LOW (ref 8.9–10.3)
Chloride: 96 mmol/L — ABNORMAL LOW (ref 98–111)
Creatinine, Ser: 0.61 mg/dL (ref 0.61–1.24)
GFR, Estimated: >60 mL/min (ref >60)
Glucose, Bld: 106 mg/dL — ABNORMAL HIGH (ref 70–99)
Phosphorus: 3.3 mg/dL (ref 2.5–4.6)
Potassium: 4 mmol/L (ref 3.5–5.1)
Sodium: 139 mmol/L (ref 135–145)

## 2023-09-10 LAB — CULTURE, BLOOD (ROUTINE X 2)
Culture: NO GROWTH
Culture: NO GROWTH
Special Requests: ADEQUATE
Special Requests: ADEQUATE

## 2023-09-10 LAB — MAGNESIUM: Magnesium: 1.9 mg/dL (ref 1.7–2.4)

## 2023-09-10 MED ORDER — LOSARTAN POTASSIUM 25 MG PO TABS
25.0000 mg | ORAL_TABLET | Freq: Once | ORAL | Status: AC
  Administered 2023-09-10: 25 mg via ORAL
  Filled 2023-09-10: qty 1

## 2023-09-10 MED ORDER — PREDNISONE 20 MG PO TABS
60.0000 mg | ORAL_TABLET | Freq: Every day | ORAL | Status: DC
  Administered 2023-09-11 – 2023-09-12 (×2): 60 mg via ORAL
  Filled 2023-09-10 (×2): qty 3

## 2023-09-10 MED ORDER — LOSARTAN POTASSIUM 50 MG PO TABS
50.0000 mg | ORAL_TABLET | Freq: Every day | ORAL | Status: DC
  Administered 2023-09-11 – 2023-09-12 (×2): 50 mg via ORAL
  Filled 2023-09-10 (×2): qty 1

## 2023-09-10 NOTE — Plan of Care (Signed)
  Problem: Education: Goal: Knowledge of General Education information will improve Description: Including pain rating scale, medication(s)/side effects and non-pharmacologic comfort measures Outcome: Progressing   Problem: Health Behavior/Discharge Planning: Goal: Ability to manage health-related needs will improve Outcome: Progressing   Problem: Clinical Measurements: Goal: Ability to maintain clinical measurements within normal limits will improve Outcome: Progressing Goal: Will remain free from infection Outcome: Progressing Goal: Diagnostic test results will improve Outcome: Progressing Goal: Respiratory complications will improve Outcome: Progressing Goal: Cardiovascular complication will be avoided Outcome: Progressing   Problem: Activity: Goal: Risk for activity intolerance will decrease Outcome: Progressing   Problem: Nutrition: Goal: Adequate nutrition will be maintained Outcome: Progressing   Problem: Coping: Goal: Level of anxiety will decrease Outcome: Progressing   Problem: Elimination: Goal: Will not experience complications related to bowel motility Outcome: Progressing Goal: Will not experience complications related to urinary retention Outcome: Progressing   Problem: Pain Managment: Goal: General experience of comfort will improve and/or be controlled Outcome: Progressing   Problem: Safety: Goal: Ability to remain free from injury will improve Outcome: Progressing   Problem: Coping: Goal: Ability to adjust to condition or change in health will improve Outcome: Progressing   Problem: Fluid Volume: Goal: Ability to maintain a balanced intake and output will improve Outcome: Progressing   Problem: Metabolic: Goal: Ability to maintain appropriate glucose levels will improve Outcome: Progressing   Problem: Nutritional: Goal: Maintenance of adequate nutrition will improve Outcome: Progressing Goal: Progress toward achieving an optimal weight  will improve Outcome: Progressing

## 2023-09-10 NOTE — Progress Notes (Signed)
 PROGRESS NOTE    Jackson Mathews  WUJ:[id number] DOB: Mar 28, 1956 DOA: 09/05/2023 PCP: Lavada Mesi, MD    Chief Complaint  Patient presents with   Chest Pain    Brief Narrative:  Per HPI Pt is encephelopathic; therefore, this HPI is obtained from chart review. Jackson Mathews is a 68 y.o. male who has a PMH as below including but not limited to COPD/emphysema (no PFTs in system but CXR does show emphysema), HTN, HLD, carotid artery occlusion, CAD 2017.   He presented to [facility] 2/11 after being diagnosed 1 day prior with influenza A with home test per sisters report (he was started on Tamiflu by PCP). On 2/11, he developed worsening symptoms including respiratory distress prompting him to drive to the ED. In ED, he had AMS and was in significant distress to the point he required intubation after failing BiPAP trial (had worsened ABG with worsening hypercapnia). He was intubated and was later transferred to [facility] for further evaluation.   Head CT was negative. CXR showed possible RUL infiltrate. He was started on Steroids, Ceftriaxone and Azithromycin and had cultures drawn. Of note, Flu/RSV/COVID were negative in ED though he reportedly did test positive for flu A 2/10 with home test (and started on Tamiflu by PCP as mentioned above).   Assessment & Plan:   Principal Problem:   Respiratory failure with hypoxia and hypercapnia (HCC) Active Problems:   Nicotine dependence with current use   CAD in native artery-cath 09/24/15 non obstructive   Essential hypertension   HLD (hyperlipidemia)   Tobacco use   Respiratory failure (HCC)   COPD with acute exacerbation (HCC)   Influenza A  #1 acute hypercapnic respiratory failure in the setting of influenza A and COPD exacerbation -Status post mechanical ventilation currently extubated and on 4 L nasal cannula. -Patient noted to have presented to the ED with worsening symptoms of shortness of breath, cough noted to be in respiratory  distress also noted to have altered mental status requiring intubation after failing BiPAP. -It is noted that patient had tested positive with influenza A with home test per sister's report. -CT head done on admission negative for any acute abnormalities. -Respiratory viral panel negative.  Blood cultures with no growth to date x 4 days.  MRSA PCR negative. -SARS coronavirus 2 PCR negative. Influenza A by PCR negative, influenza B by PCR negative, RSV by PCR negative. -Patient with clinical improvement not as visibly short of breath today as he was yesterday.   -Continue scheduled DuoNebs, Pulmicort, Brovana, Claritin, PPI, Flonase. -Continue IV Solu-Medrol 40 mg daily and transition to oral prednisone 60 mg daily starting tomorrow 09/11/2023. -Continue Tamiflu. -Supportive care. -Check ambulatory sats tomorrow.  2.  Probable influenza A -Patient noted to have tested positive at home 2/10 but negative in the ED 2/11. -Respiratory viral panel negative. -Continue empiric Tamiflu x 5 days. -Droplet precautions.  3.  Hyponatremia -Resolved. -Urine osmolality and urine sodium suggesting hypovolemia and improved with hydration.  4.  Hypertension -Patient noted to be on amlodipine prior to admission which was uptitrated to 10 mg daily and patient started on Cozaar 25 mg daily.  -Increase Cozaar to 50 mg daily.   -IV hydralazine as needed.   5.  Hyperlipidemia -Statin.    6.  CAD, carotid artery occlusion status post right CEA 2017 -Continue home antihypertensive medications, as needed hydralazine. -Continue aspirin, clopidogrel, statin.  7.  Hyperglycemia -Likely secondary to steroids. -Hemoglobin A1c 5.4. -CBG 118 this morning. -SSI.  8.  Tobacco abuse -Tobacco cessation stressed to patient. -Nicotine patch.   DVT prophylaxis: Lovenox Code Status: DNR with interventions Family Communication: Updated patient, no family at bedside.   Disposition: Home when clinically  improved.  Status is: Inpatient Remains inpatient appropriate because: Severity of illness   Consultants:  PCCM admission  Procedures:  CT head 09/05/2023 Chest x-ray 09/05/2023, 09/06/2023 2D echo 09/06/2023  Significant Hospital Events: Including procedures, antibiotic start and stop dates in addition to other pertinent events   2/11 intubated, admit  2/12 tolerated SBT 2/13 tolerated SBT  Antimicrobials:  Anti-infectives (From admission, onward)    Start     Dose/Rate Route Frequency Ordered Stop   09/09/23 1000  azithromycin (ZITHROMAX) tablet 250 mg        250 mg Oral Daily 09/09/23 0758 09/10/23 0835   09/07/23 2200  oseltamivir (TAMIFLU) capsule 75 mg        75 mg Oral 2 times daily 09/07/23 1511 09/12/23 2159   09/07/23 0200  cefTRIAXone (ROCEPHIN) 2 g in sodium chloride 0.9 % 100 mL IVPB        2 g 200 mL/hr over 30 Minutes Intravenous Every 24 hours 09/06/23 1623 09/10/23 0546   09/06/23 0130  oseltamivir (TAMIFLU) 6 MG/ML suspension 75 mg  Status:  Discontinued        75 mg Per Tube 2 times daily 09/05/23 2237 09/07/23 1511   09/05/23 2330  cefTRIAXone (ROCEPHIN) 1 g in sodium chloride 0.9 % 100 mL IVPB  Status:  Discontinued        1 g 200 mL/hr over 30 Minutes Intravenous Every 24 hours 09/05/23 2239 09/06/23 1623   09/05/23 2245  azithromycin (ZITHROMAX) 500 mg in sodium chloride 0.9 % 250 mL IVPB        500 mg 250 mL/hr over 60 Minutes Intravenous Every 24 hours 09/05/23 2239 09/07/23 2217   09/05/23 2030  ceFEPIme (MAXIPIME) 2 g in sodium chloride 0.9 % 100 mL IVPB        2 g 200 mL/hr over 30 Minutes Intravenous  Once 09/05/23 2018 09/05/23 2104   09/05/23 2030  metroNIDAZOLE (FLAGYL) IVPB 500 mg        500 mg 100 mL/hr over 60 Minutes Intravenous  Once 09/05/23 2018 09/05/23 2235   09/05/23 2030  vancomycin (VANCOCIN) IVPB 1000 mg/200 mL premix        1,000 mg 200 mL/hr over 60 Minutes Intravenous  Once 09/05/23 2018 09/05/23 2344          Subjective: Laying in bed.  Feels shortness of breath is improving.  Denies any chest pain.  No abdominal pain.  Overall feeling well.  States able to void well after Foley catheter was removed.  Sats of 91% on 2 L nasal cannula.    Objective: Vitals:   09/10/23 0500 09/10/23 0637 09/10/23 0811 09/10/23 0828  BP:  (!) 158/82  (!) 174/80  Pulse: 82   (!) 109  Resp: 20   (!) 21  Temp:    97.9 F (36.6 C)  TempSrc:    Oral  SpO2: 93%  93% 91%  Weight: 57.3 kg     Height:        Intake/Output Summary (Last 24 hours) at 09/10/2023 1008 Last data filed at 09/10/2023 1610 Gross per 24 hour  Intake 118 ml  Output 1600 ml  Net -1482 ml   Filed Weights   09/08/23 1513 09/09/23 0500 09/10/23 0500  Weight: 61.3 kg 63 kg  57.3 kg    Examination:  General exam: NAD. Respiratory system: Decreased breath sounds in the bases.  Minimal expiratory wheezing.  Fair air movement.  Speaking in full sentences.    Cardiovascular system: Regular rate rhythm no murmurs rubs or gallops.  No JVD.  No lower extremity edema.  Gastrointestinal system: Abdomen soft, nontender, nondistended, positive bowel sounds.  No rebound.  No guarding.  Central nervous system: Alert and oriented. No focal neurological deficits. Extremities: Symmetric 5 x 5 power. Skin: No rashes, lesions or ulcers Psychiatry: Judgement and insight appear normal. Mood & affect appropriate.     Data Reviewed: I have personally reviewed following labs and imaging studies  CBC: Recent Labs  Lab 09/05/23 1915 09/05/23 1933 09/06/23 0832 09/07/23 0223 09/08/23 1053 09/09/23 0805 09/10/23 0159  WBC 10.9*  --  10.1 7.4 11.3* 12.1* 6.8  NEUTROABS 8.3*  --   --   --   --   --  4.0  HGB 15.1   < > 13.1 11.4* 14.1 15.7 14.6  HCT 46.4   < > 38.6* 33.9* 42.9 47.5 44.8  MCV 90.1  --  87.5 88.3 90.7 90.3 90.1  PLT 332  --  265 241 307 337 308   < > = values in this interval not displayed.    Basic Metabolic Panel: Recent Labs   Lab 09/05/23 1915 09/05/23 1933 09/06/23 0832 09/06/23 1150 09/07/23 1746 09/07/23 2332 09/08/23 1053 09/09/23 0805 09/10/23 0159  NA 121*   < > 123*   < > 135 137 138 138 139  K 3.5   < > 5.1   < > 5.0 4.5 4.0 4.0 4.0  CL 79*  --  84*   < > 94* 95* 90* 94* 96*  CO2 28  --  28   < > 33* 33* 34* 32 32  GLUCOSE 284*  --  127*   < > 118* 100* 130* 80 106*  BUN 14  --  18   < > 17 15 11 15 18   CREATININE 0.74  --  0.79   < > 0.78 0.62 0.70 0.71 0.61  CALCIUM 9.2  --  8.4*   < > 9.1 8.6* 8.7* 8.8* 8.6*  MG 1.9  --  2.4  --   --   --   --  2.0 1.9  PHOS  --   --  2.7  --   --   --   --   --  3.3   < > = values in this interval not displayed.    GFR: Estimated Creatinine Clearance: 73.6 mL/min (by C-G formula based on SCr of 0.61 mg/dL).  Liver Function Tests: Recent Labs  Lab 09/05/23 1915 09/10/23 0159  AST 40  --   ALT 26  --   ALKPHOS 96  --   BILITOT 0.9  --   PROT 7.0  --   ALBUMIN 4.0 2.5*    CBG: Recent Labs  Lab 09/08/23 2044 09/09/23 1235 09/09/23 1623 09/09/23 2118 09/10/23 0613  GLUCAP 151* 157* 128* 208* 118*     Recent Results (from the past 240 hours)  Culture, blood (Routine X 2) w Reflex to ID Panel     Status: None   Collection Time: 09/05/23  7:15 PM   Specimen: BLOOD  Result Value Ref Range Status   Specimen Description   Final    BLOOD LEFT ANTECUBITAL Performed at [facility], [address], [place], [place] [address]  Special Requests   Final    BOTTLES DRAWN AEROBIC AND ANAEROBIC Blood Culture adequate volume Performed at [facility], [address] [place], [place] [address]    Culture   Final    NO GROWTH 5 DAYS Performed at [facility], [address] [place], [place] [address]    Report Status 09/10/2023 FINAL  Final  Culture, blood (Routine X 2) w Reflex to ID Panel     Status: None   Collection Time: 09/05/23  7:20 PM   Specimen: BLOOD  Result Value Ref Range Status    Specimen Description   Final    BLOOD RIGHT ANTECUBITAL Performed at [facility], [address] [place], [place] [address]    Special Requests   Final    BOTTLES DRAWN AEROBIC AND ANAEROBIC Blood Culture adequate volume Performed at [facility], [address], [place], [place] [address]    Culture   Final    NO GROWTH 5 DAYS Performed at [facility], [address], [place], [place] [address]    Report Status 09/10/2023 FINAL  Final  Resp panel by RT-PCR (RSV, Flu A&B, Covid) Anterior Nasal Swab     Status: None   Collection Time: 09/05/23  7:31 PM   Specimen: Anterior Nasal Swab  Result Value Ref Range Status   SARS Coronavirus 2 by RT PCR NEGATIVE NEGATIVE Final    Comment: (NOTE) SARS-CoV-2 target nucleic acids are NOT DETECTED.  The SARS-CoV-2 RNA is generally detectable in upper respiratory specimens during the acute phase of infection. The lowest concentration of SARS-CoV-2 viral copies this assay can detect is 138 copies/mL. A negative result does not preclude SARS-Cov-2 infection and should not be used as the sole basis for treatment or other patient management decisions. A negative result may occur with  improper specimen collection/handling, submission of specimen other than nasopharyngeal swab, presence of viral mutation(s) within the areas targeted by this assay, and inadequate number of viral copies(<138 copies/mL). A negative result must be combined with clinical observations, patient history, and epidemiological information. The expected result is Negative.  Fact Sheet for Patients:  BloggerCourse.com  Fact Sheet for Healthcare Providers:  SeriousBroker.it  This test is no t yet approved or cleared by the [place] FDA and  has been authorized for detection and/or diagnosis of SARS-CoV-2 by FDA under an Emergency Use Authorization (EUA). This EUA will  remain  in effect (meaning this test can be used) for the duration of the COVID-19 declaration under Section 564(b)(1) of the Act, 21 U.S.C.section 360bbb-3(b)(1), unless the authorization is terminated  or revoked sooner.       Influenza A by PCR NEGATIVE NEGATIVE Final   Influenza B by PCR NEGATIVE NEGATIVE Final    Comment: (NOTE) The Xpert Xpress SARS-CoV-2/FLU/RSV plus assay is intended as an aid in the diagnosis of influenza from Nasopharyngeal swab specimens and should not be used as a sole basis for treatment. Nasal washings and aspirates are unacceptable for Xpert Xpress SARS-CoV-2/FLU/RSV testing.  Fact Sheet for Patients: BloggerCourse.com  Fact Sheet for Healthcare Providers: SeriousBroker.it  This test is not yet approved or cleared by the [place] FDA and has been authorized for detection and/or diagnosis of SARS-CoV-2 by FDA under an Emergency Use Authorization (EUA). This EUA will remain in effect (meaning this test can be used) for the duration of the COVID-19 declaration under Section 564(b)(1) of the Act, 21 U.S.C. section 360bbb-3(b)(1), unless the authorization is terminated  or revoked.     Resp Syncytial Virus by PCR NEGATIVE NEGATIVE Final    Comment: (NOTE) Fact Sheet for Patients: BloggerCourse.com  Fact Sheet for Healthcare Providers: SeriousBroker.it  This test is not yet approved or cleared by the [place] FDA and has been authorized for detection and/or diagnosis of SARS-CoV-2 by FDA under an Emergency Use Authorization (EUA). This EUA will remain in effect (meaning this test can be used) for the duration of the COVID-19 declaration under Section 564(b)(1) of the Act, 21 U.S.C. section 360bbb-3(b)(1), unless the authorization is terminated or revoked.  Performed at [facility], [address], [place],  [place] [address]   MRSA Next Gen by PCR, Nasal     Status: None   Collection Time: 09/06/23 12:28 AM   Specimen: Nasal Mucosa; Nasal Swab  Result Value Ref Range Status   MRSA by PCR Next Gen NOT DETECTED NOT DETECTED Final    Comment: (NOTE) The GeneXpert MRSA Assay (FDA approved for NASAL specimens only), is one component of a comprehensive MRSA colonization surveillance program. It is not intended to diagnose MRSA infection nor to guide or monitor treatment for MRSA infections. Test performance is not FDA approved in patients less than 77 years old. Performed at [facility], [address], [place], [place] [address]   Respiratory (~20 pathogens) panel by PCR     Status: None   Collection Time: 09/07/23  4:20 AM   Specimen: Nasopharyngeal Swab; Respiratory  Result Value Ref Range Status   Adenovirus NOT DETECTED NOT DETECTED Final   Coronavirus 229E NOT DETECTED NOT DETECTED Final    Comment: (NOTE) The Coronavirus on the Respiratory Panel, DOES NOT test for the novel  Coronavirus (2019 nCoV)    Coronavirus HKU1 NOT DETECTED NOT DETECTED Final   Coronavirus NL63 NOT DETECTED NOT DETECTED Final   Coronavirus OC43 NOT DETECTED NOT DETECTED Final   Metapneumovirus NOT DETECTED NOT DETECTED Final   Rhinovirus / Enterovirus NOT DETECTED NOT DETECTED Final   Influenza A NOT DETECTED NOT DETECTED Final   Influenza B NOT DETECTED NOT DETECTED Final   Parainfluenza Virus 1 NOT DETECTED NOT DETECTED Final   Parainfluenza Virus 2 NOT DETECTED NOT DETECTED Final   Parainfluenza Virus 3 NOT DETECTED NOT DETECTED Final   Parainfluenza Virus 4 NOT DETECTED NOT DETECTED Final   Respiratory Syncytial Virus NOT DETECTED NOT DETECTED Final   Bordetella pertussis NOT DETECTED NOT DETECTED Final   Bordetella Parapertussis NOT DETECTED NOT DETECTED Final   Chlamydophila pneumoniae NOT DETECTED NOT DETECTED Final   Mycoplasma pneumoniae NOT DETECTED NOT DETECTED Final    Comment: Performed at  [facility], [address], [place], [place] [address]         Radiology Studies: No results found.      Scheduled Meds:  amLODipine  10 mg Oral Daily   arformoterol  15 mcg Nebulization BID   aspirin  81 mg Oral Daily   atorvastatin  40 mg Oral Daily   budesonide (PULMICORT) nebulizer solution  0.5 mg Nebulization BID   Chlorhexidine Gluconate Cloth  6 each Topical Daily   clopidogrel  75 mg Oral Daily   docusate sodium  100 mg Oral BID   enoxaparin (LOVENOX) injection  40 mg Subcutaneous Daily   fluticasone  2 spray Each Nare Daily   insulin aspart  0-9 Units Subcutaneous TID WC   loratadine  10 mg Oral Daily   losartan  25 mg Oral  Daily   nicotine  14 mg Transdermal Daily   oseltamivir  75 mg Oral BID   pantoprazole  40 mg Oral Q0600   Continuous Infusions:     LOS: 4 days    Time spent: 40 minutes    Ramiro Harvest, MD [facility]   To contact the attending provider between 7A-7P or the covering provider during after hours 7P-7A, please log into the web site [email or web address] and access using universal [place] password for that web site. If you do not have the password, please call the hospital operator.  09/10/2023, 10:08 AM

## 2023-09-10 NOTE — Progress Notes (Signed)
 Mobility Specialist Progress Note:   09/10/23 1620  Mobility  Activity Ambulated with assistance in hallway  Level of Assistance Contact guard assist, steadying assist  Assistive Device None  Distance Ambulated (ft) 350 ft  Activity Response Tolerated well  Mobility Referral Yes  Mobility visit 1 Mobility  Mobility Specialist Start Time (ACUTE ONLY) 1529  Mobility Specialist Stop Time (ACUTE ONLY) 1541  Mobility Specialist Time Calculation (min) (ACUTE ONLY) 12 min   Received pt in bed having no complaints and agreeable to mobility.attempted to ambulate on RA, d/t Spo2 dropping to 87%, O2 flow increased to 2L/min. Returned to room w/o fault. Left in bed w/ call bell in reach and all needs met. Left on 2L/min, RN aware.  Pre Mobility RA SPO2 91% During Mobility RA SPO2 87                          2L/min SPO2 92% Post Mobility 2L/min 93%  Thompson Grayer Mobility Specialist  Please contact vis Secure Chat or  Rehab Office [phone]

## 2023-09-10 NOTE — Plan of Care (Signed)

## 2023-09-11 DIAGNOSIS — J9601 Acute respiratory failure with hypoxia: Secondary | ICD-10-CM | POA: Diagnosis not present

## 2023-09-11 DIAGNOSIS — J441 Chronic obstructive pulmonary disease with (acute) exacerbation: Secondary | ICD-10-CM | POA: Diagnosis not present

## 2023-09-11 DIAGNOSIS — J101 Influenza due to other identified influenza virus with other respiratory manifestations: Secondary | ICD-10-CM | POA: Diagnosis not present

## 2023-09-11 DIAGNOSIS — I2510 Atherosclerotic heart disease of native coronary artery without angina pectoris: Secondary | ICD-10-CM | POA: Diagnosis not present

## 2023-09-11 LAB — CBC WITH DIFFERENTIAL/PLATELET
Abs Immature Granulocytes: 0.54 10*3/uL — ABNORMAL HIGH (ref 0.00–0.07)
Basophils Absolute: 0.1 10*3/uL (ref 0.0–0.1)
Basophils Relative: 1 %
Eosinophils Absolute: 0 10*3/uL (ref 0.0–0.5)
Eosinophils Relative: 0 %
HCT: 44 % (ref 39.0–52.0)
Hemoglobin: 14.3 g/dL (ref 13.0–17.0)
Immature Granulocytes: 7 %
Lymphocytes Relative: 19 %
Lymphs Abs: 1.5 10*3/uL (ref 0.7–4.0)
MCH: 29 pg (ref 26.0–34.0)
MCHC: 32.5 g/dL (ref 30.0–36.0)
MCV: 89.2 fL (ref 80.0–100.0)
Monocytes Absolute: 0.8 10*3/uL (ref 0.1–1.0)
Monocytes Relative: 11 %
Neutro Abs: 5 10*3/uL (ref 1.7–7.7)
Neutrophils Relative %: 62 %
Platelets: 320 10*3/uL (ref 150–400)
RBC: 4.93 MIL/uL (ref 4.22–5.81)
RDW: 12.8 % (ref 11.5–15.5)
WBC: 8 10*3/uL (ref 4.0–10.5)
nRBC: 0 % (ref 0.0–0.2)

## 2023-09-11 LAB — BASIC METABOLIC PANEL
Anion gap: 8 (ref 5–15)
BUN: 20 mg/dL (ref 8–23)
CO2: 31 mmol/L (ref 22–32)
Calcium: 8.4 mg/dL — ABNORMAL LOW (ref 8.9–10.3)
Chloride: 99 mmol/L (ref 98–111)
Creatinine, Ser: 0.69 mg/dL (ref 0.61–1.24)
GFR, Estimated: >60 mL/min (ref >60)
Glucose, Bld: 165 mg/dL — ABNORMAL HIGH (ref 70–99)
Potassium: 3.9 mmol/L (ref 3.5–5.1)
Sodium: 138 mmol/L (ref 135–145)

## 2023-09-11 LAB — GLUCOSE, CAPILLARY
Glucose-Capillary: 134 mg/dL — ABNORMAL HIGH (ref 70–99)
Glucose-Capillary: 185 mg/dL — ABNORMAL HIGH (ref 70–99)
Glucose-Capillary: 163 mg/dL — ABNORMAL HIGH (ref 70–99)

## 2023-09-11 LAB — MAGNESIUM: Magnesium: 1.9 mg/dL (ref 1.7–2.4)

## 2023-09-11 NOTE — Progress Notes (Signed)
 Physical Therapy Treatment Patient Details Name: Jackson Mathews MRN: [id number] DOB: 1956/04/27 Today's Date: 09/11/2023   History of Present Illness 68 y.o. male admitted 2/11 with respiratory distress, after  being diagnosed with influenza A by his primary care doctor 2/10 and started on Tamiflu. Required intubation after failing BiPAP trial. Extubated 2/13. PMHx: Hearing loss; DYSPNEA; SKIN CANCER, HX OF; Hx of colonic polyp - ssp; Peripheral vascular disease; Carotid artery disease; History of right-sided carotid endarterectomy; Nicotine dependence with current use; CAD in native artery-cath 09/24/15 non obstructive; Essential hypertension; HLD (hyperlipidemia); Lower urinary tract symptoms (LUTS); Systolic murmur; Actinic keratosis; Hypoxia; Tobacco use; Bronchitis due to tobacco use; Hyponatremia; Leukocytosis; Carotid artery stenosis, symptomatic, right; and Respiratory failure on their problem list.    PT Comments  Pt continuing to progress well towards all goals. Pt however requires 2Lo2 via [place] to maintain SpO2 > 92% during ambulation. Pt with improved ambulation tolerance. Acute PT to cont to follow.    If plan is discharge home, recommend the following: A little help with walking and/or transfers;A little help with bathing/dressing/bathroom;Assistance with cooking/housework;Assist for transportation;Help with stairs or ramp for entrance   Can travel by private vehicle        Equipment Recommendations  Other (comment) (TBD; unlikely to have needs)    Recommendations for Other Services       Precautions / Restrictions Precautions Precautions: Fall Recall of Precautions/Restrictions: Intact Precaution/Restrictions Comments: Monitor O2, BP, and HR Restrictions Weight Bearing Restrictions Per Provider Order: No     Mobility  Bed Mobility Overal bed mobility: Needs Assistance Bed Mobility: Supine to Sit, Sit to Supine     Supine to sit: HOB elevated, Supervision Sit to  supine: Supervision   General bed mobility comments: supervision for safety    Transfers Overall transfer level: Needs assistance Equipment used: None Transfers: Sit to/from Stand Sit to Stand: Contact guard assist           General transfer comment: CGA for safety    Ambulation/Gait Ambulation/Gait assistance: Contact guard assist Gait Distance (Feet): 300 Feet Assistive device: None Gait Pattern/deviations: Step-through pattern, Decreased stride length Gait velocity: dec     General Gait Details: decreased R step height and appears to have mild R drop foot but no episode of LOB. Pt SpO2 >92% on 2Lo2 via [place]   Stairs             Wheelchair Mobility     Tilt Bed    Modified Rankin (Stroke Patients Only)       Balance Overall balance assessment: Needs assistance Sitting-balance support: No upper extremity supported, Feet supported Sitting balance-Leahy Scale: Fair     Standing balance support: Single extremity supported, During functional activity Standing balance-Leahy Scale: Fair                              Hotel manager: No apparent difficulties  Cognition Arousal: Alert Behavior During Therapy: WFL for tasks assessed/performed                             Following commands: Intact      Cueing Cueing Techniques: Verbal cues, Gestural cues, Visual cues  Exercises      General Comments General comments (skin integrity, edema, etc.): VSS on 2Lo2 via [place]      Pertinent Vitals/Pain Pain Assessment Pain Assessment: No/denies pain    Home  Living                          Prior Function            PT Goals (current goals can now be found in the care plan section) Acute Rehab PT Goals Patient Stated Goal: get well PT Goal Formulation: With patient Time For Goal Achievement: 09/22/23 Potential to Achieve Goals: Good Progress towards PT goals: Progressing toward goals     Frequency    Min 1X/week      PT Plan      Co-evaluation              AM-PAC PT "6 Clicks" Mobility   Outcome Measure  Help needed turning from your back to your side while in a flat bed without using bedrails?: None Help needed moving from lying on your back to sitting on the side of a flat bed without using bedrails?: A Little Help needed moving to and from a bed to a chair (including a wheelchair)?: A Little Help needed standing up from a chair using your arms (e.g., wheelchair or bedside chair)?: A Little Help needed to walk in hospital room?: A Little Help needed climbing 3-5 steps with a railing? : A Little 6 Click Score: 19    End of Session Equipment Utilized During Treatment: Gait belt;Oxygen Activity Tolerance: Patient limited by fatigue Patient left: with call bell/phone within reach;in bed Nurse Communication: Mobility status PT Visit Diagnosis: Unsteadiness on feet (R26.81);Muscle weakness (generalized) (M62.81);Difficulty in walking, not elsewhere classified (R26.2)     Time: 1355-1406 PT Time Calculation (min) (ACUTE ONLY): 11 min  Charges:    $Gait Training: 8-22 mins PT General Charges $$ ACUTE PT VISIT: 1 Visit                     Lewis Shock, PT, DPT Acute Rehabilitation Services Secure chat preferred Office #: [phone]    Iona Hansen 09/11/2023, 2:46 PM

## 2023-09-11 NOTE — Progress Notes (Signed)
 SATURATION QUALIFICATIONS: (This note is used to comply with regulatory documentation for home oxygen)  Patient Saturations on Room Air at Rest = 92%  Patient Saturations on Room Air while Ambulating = 87%  Patient Saturations on 2 Liters of oxygen while Ambulating = 91-92%  Please briefly explain why patient needs home oxygen:

## 2023-09-11 NOTE — Plan of Care (Signed)
  Problem: Health Behavior/Discharge Planning: Goal: Ability to manage health-related needs will improve Outcome: Progressing   Problem: Clinical Measurements: Goal: Will remain free from infection Outcome: Progressing   Problem: Activity: Goal: Risk for activity intolerance will decrease Outcome: Progressing   Problem: Nutrition: Goal: Adequate nutrition will be maintained Outcome: Progressing   Problem: Coping: Goal: Level of anxiety will decrease Outcome: Progressing   Problem: Pain Managment: Goal: General experience of comfort will improve and/or be controlled Outcome: Progressing   Problem: Safety: Goal: Ability to remain free from injury will improve Outcome: Progressing   Problem: Skin Integrity: Goal: Risk for impaired skin integrity will decrease Outcome: Progressing

## 2023-09-11 NOTE — Progress Notes (Signed)
 Mobility Specialist Progress Note;   09/11/23 0915  Mobility  Activity Ambulated with assistance in hallway  Level of Assistance Contact guard assist, steadying assist  Assistive Device None  Distance Ambulated (ft) 300 ft  Activity Response Tolerated well  Mobility Referral Yes  Mobility visit 1 Mobility  Mobility Specialist Start Time (ACUTE ONLY) 0915  Mobility Specialist Stop Time (ACUTE ONLY) 0930  Mobility Specialist Time Calculation (min) (ACUTE ONLY) 15 min   Pt eager for mobility. On 2LO2 upon arrival. Required MinG assistance throughout ambulation for safety. Ambulated on 2LO2, VSS throughout. No c/o during session. Pt returned back to bed with all needs met, on 2LO2. Sister in room.   Caesar Bookman Mobility Specialist Please contact via SecureChat or [employer] [phone]

## 2023-09-11 NOTE — TOC Progression Note (Signed)
 Transition of Care [facility]) - Progression Note    Patient Details  Name: Jackson Mathews MRN: [id number] Date of Birth: Jul 24, 1956  [facility] CM/SW Contact  Harriet Masson, RN Phone Number: 09/11/2023, 3:45 PM  Clinical Narrative:    Spoke to patient regarding transition needs. Patient lives alone and has supportive sisters.  Patient is agreeable to home 02. Notified [name] with [employer]. Sister will transport home. Address, Phone number and PCP verified.   Expected Discharge Plan: OP Rehab Barriers to Discharge: Continued Medical Work up  Expected Discharge Plan and Services       Living arrangements for the past 2 months: Single Family Home                 DME Arranged: Oxygen DME Agency: [employer] Date DME Agency Contacted: 09/11/23 Time DME Agency Contacted: [phone] Representative spoke with at DME Agency: Acquanetta Belling             Social Determinants of Health (SDOH) Interventions SDOH Screenings   Food Insecurity: Patient Unable To Answer (09/06/2023)  Housing: Patient Unable To Answer (09/06/2023)  Transportation Needs: Patient Unable To Answer (09/06/2023)  Utilities: Patient Unable To Answer (09/06/2023)  Social Connections: Patient Unable To Answer (09/06/2023)  Tobacco Use: High Risk (09/05/2023)    Readmission Risk Interventions     No data to display

## 2023-09-11 NOTE — Progress Notes (Signed)
   Durable Medical Equipment (From admission, onward)        Start     Ordered  09/11/23 1222  For home use only DME oxygen  Once      Question Answer Comment Length of Need 6 Months  Mode or (Route) Nasal cannula  Liters per Minute 2  Frequency Continuous (stationary and portable oxygen unit needed)  Oxygen conserving device Yes  Oxygen delivery system Gas    09/11/23 1221  09/09/23 1912  For home use only DME 3 n 1  Once       09/09/23 1911         Ileana Ladd, RN  Registered Nurse   Progress Notes    Signed   Date of Service: 09/11/2023 11:01 AM   Signed      SATURATION QUALIFICATIONS: (This note is used to comply with regulatory documentation for home oxygen)   Patient Saturations on Room Air at Rest = 92%   Patient Saturations on Room Air while Ambulating = 87%   Patient Saturations on 2 Liters of oxygen while Ambulating = 91-92%   Please briefly explain why patient needs home oxygen:

## 2023-09-11 NOTE — Progress Notes (Signed)
 PROGRESS NOTE    Jackson Mathews  ZHY:[id number] DOB: [birth year] DOA: 09/05/2023 PCP: Lavada Mesi, MD    Chief Complaint  Patient presents with   Chest Pain    Brief Narrative:  Per HPI Pt is encephelopathic; therefore, this HPI is obtained from chart review. Jackson Mathews is a 68 y.o. male who has a PMH as below including but not limited to COPD/emphysema (no PFTs in system but CXR does show emphysema), HTN, HLD, carotid artery occlusion, CAD 2017.   He presented to [facility] 2/11 after being diagnosed 1 day prior with influenza A with home test per sisters report (he was started on Tamiflu by PCP). On 2/11, he developed worsening symptoms including respiratory distress prompting him to drive to the ED. In ED, he had AMS and was in significant distress to the point he required intubation after failing BiPAP trial (had worsened ABG with worsening hypercapnia). He was intubated and was later transferred to [facility] for further evaluation.   Head CT was negative. CXR showed possible RUL infiltrate. He was started on Steroids, Ceftriaxone and Azithromycin and had cultures drawn. Of note, Flu/RSV/COVID were negative in ED though he reportedly did test positive for flu A 2/10 with home test (and started on Tamiflu by PCP as mentioned above).   Assessment & Plan:   Principal Problem:   Respiratory failure with hypoxia and hypercapnia (HCC) Active Problems:   Nicotine dependence with current use   CAD in native artery-cath 09/24/15 non obstructive   Essential hypertension   HLD (hyperlipidemia)   Tobacco use   Respiratory failure (HCC)   COPD with acute exacerbation (HCC)   Influenza A  #1 acute hypercapnic respiratory failure in the setting of influenza A and COPD exacerbation -Status post mechanical ventilation currently extubated and on 4 L nasal cannula. -Patient noted to have presented to the ED with worsening symptoms of shortness of breath, cough noted to be in respiratory  distress also noted to have altered mental status requiring intubation after failing BiPAP. -It is noted that patient had tested positive with influenza A with home test per sister's report. -CT head done on admission negative for any acute abnormalities. -Respiratory viral panel negative.  Blood cultures with no growth to date x 4 days.  MRSA PCR negative. -SARS coronavirus 2 PCR negative. Influenza A by PCR negative, influenza B by PCR negative, RSV by PCR negative. -Patient with clinical improvement. -Continue scheduled DuoNebs, Pulmicort, Brovana, Claritin, PPI, Flonase. -Was on IV Solu-Medrol and has been transition to oral prednisone taper today, 09/11/2023. -Continue Tamiflu. -Supportive care. -Check ambulatory sats today.   2.  Probable influenza A -Patient noted to have tested positive at home 2/10 but negative in the ED 2/11. -Respiratory viral panel negative. -Continue empiric Tamiflu to complete a 5-day course. -Droplet precautions.  3.  Hyponatremia -Resolved. -Urine osmolality and urine sodium suggesting hypovolemia and improved with hydration.  4.  Hypertension -Patient noted to be on amlodipine prior to admission which was uptitrated to 10 mg daily and patient started on Cozaar 25 mg daily and dose uptitrated to 50 mg daily. -BP better controlled. -IV hydralazine as needed. -Outpatient follow-up with PCP.  5.  Hyperlipidemia -Statin.    6.  CAD, carotid artery occlusion status post right CEA 2017 -Continue home antihypertensive medications, as needed hydralazine. -Continue aspirin, clopidogrel, statin.  7.  Hyperglycemia -Likely secondary to steroids. -Hemoglobin A1c 5.4. -CBG 134 this morning. -SSI.  8.  Tobacco abuse -Tobacco cessation stressed to  patient. -Nicotine patch.   DVT prophylaxis: Lovenox Code Status: DNR with interventions Family Communication: Updated patient, and sister at bedside.  Disposition: Home when clinically improved hopefully  in the next 24 hours..  Status is: Inpatient Remains inpatient appropriate because: Severity of illness   Consultants:  PCCM admission  Procedures:  CT head 09/05/2023 Chest x-ray 09/05/2023, 09/06/2023 2D echo 09/06/2023  Significant Hospital Events: Including procedures, antibiotic start and stop dates in addition to other pertinent events   2/11 intubated, admit  2/12 tolerated SBT 2/13 tolerated SBT  Antimicrobials:  Anti-infectives (From admission, onward)    Start     Dose/Rate Route Frequency Ordered Stop   09/09/23 1000  azithromycin (ZITHROMAX) tablet 250 mg        250 mg Oral Daily 09/09/23 0758 09/10/23 0835   09/07/23 2200  oseltamivir (TAMIFLU) capsule 75 mg        75 mg Oral 2 times daily 09/07/23 1511 09/12/23 2159   09/07/23 0200  cefTRIAXone (ROCEPHIN) 2 g in sodium chloride 0.9 % 100 mL IVPB        2 g 200 mL/hr over 30 Minutes Intravenous Every 24 hours 09/06/23 1623 09/10/23 0546   09/06/23 0130  oseltamivir (TAMIFLU) 6 MG/ML suspension 75 mg  Status:  Discontinued        75 mg Per Tube 2 times daily 09/05/23 2237 09/07/23 1511   09/05/23 2330  cefTRIAXone (ROCEPHIN) 1 g in sodium chloride 0.9 % 100 mL IVPB  Status:  Discontinued        1 g 200 mL/hr over 30 Minutes Intravenous Every 24 hours 09/05/23 2239 09/06/23 1623   09/05/23 2245  azithromycin (ZITHROMAX) 500 mg in sodium chloride 0.9 % 250 mL IVPB        500 mg 250 mL/hr over 60 Minutes Intravenous Every 24 hours 09/05/23 2239 09/07/23 2217   09/05/23 2030  ceFEPIme (MAXIPIME) 2 g in sodium chloride 0.9 % 100 mL IVPB        2 g 200 mL/hr over 30 Minutes Intravenous  Once 09/05/23 2018 09/05/23 2104   09/05/23 2030  metroNIDAZOLE (FLAGYL) IVPB 500 mg        500 mg 100 mL/hr over 60 Minutes Intravenous  Once 09/05/23 2018 09/05/23 2235   09/05/23 2030  vancomycin (VANCOCIN) IVPB 1000 mg/200 mL premix        1,000 mg 200 mL/hr over 60 Minutes Intravenous  Once 09/05/23 2018 09/05/23 2344          Subjective: Sitting up in bed.  Sister at bedside.  Patient denies any chest pain.  Feels shortness of breath is improved significantly.  Still voiding well.  Stated ambulated in the hallway earlier on today with oxygen on.  Anxious to go home.    Objective: Vitals:   09/11/23 0730 09/11/23 0732 09/11/23 0825 09/11/23 1041  BP:   137/77 137/77  Pulse:   93   Resp:   19   Temp:   98.1 F (36.7 C)   TempSrc:   Oral   SpO2: 98% 98% 91%   Weight:      Height:        Intake/Output Summary (Last 24 hours) at 09/11/2023 1057 Last data filed at 09/11/2023 0825 Gross per 24 hour  Intake 720 ml  Output 450 ml  Net 270 ml   Filed Weights   09/09/23 0500 09/10/23 0500 09/11/23 0538  Weight: 63 kg 57.3 kg 56.8 kg    Examination:  General  exam: NAD. Respiratory system: Some decreased breath sounds in the bases, no significant expiratory wheezing.  Fair air movement.  Speaking in full sentences.  Cardiovascular system: RRR no murmurs rubs or gallops.  No JVD.  No lower extremity edema.  Gastrointestinal system: Abdomen is soft, nontender, nondistended, positive bowel sounds.  No rebound.  No guarding.   Central nervous system: Alert and oriented. No focal neurological deficits. Extremities: Symmetric 5 x 5 power. Skin: No rashes, lesions or ulcers Psychiatry: Judgement and insight appear normal. Mood & affect appropriate.     Data Reviewed: I have personally reviewed following labs and imaging studies  CBC: Recent Labs  Lab 09/05/23 1915 09/05/23 1933 09/07/23 0223 09/08/23 1053 09/09/23 0805 09/10/23 0159 09/11/23 0209  WBC 10.9*   < > 7.4 11.3* 12.1* 6.8 8.0  NEUTROABS 8.3*  --   --   --   --  4.0 5.0  HGB 15.1   < > 11.4* 14.1 15.7 14.6 14.3  HCT 46.4   < > 33.9* 42.9 47.5 44.8 44.0  MCV 90.1   < > 88.3 90.7 90.3 90.1 89.2  PLT 332   < > 241 307 337 308 320   < > = values in this interval not displayed.    Basic Metabolic Panel: Recent Labs  Lab 09/05/23 1915  09/05/23 1933 09/06/23 0832 09/06/23 1150 09/07/23 2332 09/08/23 1053 09/09/23 0805 09/10/23 0159 09/11/23 0209  NA 121*   < > 123*   < > 137 138 138 139 138  K 3.5   < > 5.1   < > 4.5 4.0 4.0 4.0 3.9  CL 79*  --  84*   < > 95* 90* 94* 96* 99  CO2 28  --  28   < > 33* 34* 32 32 31  GLUCOSE 284*  --  127*   < > 100* 130* 80 106* 165*  BUN 14  --  18   < > 15 11 15 18 20   CREATININE 0.74  --  0.79   < > 0.62 0.70 0.71 0.61 0.69  CALCIUM 9.2  --  8.4*   < > 8.6* 8.7* 8.8* 8.6* 8.4*  MG 1.9  --  2.4  --   --   --  2.0 1.9 1.9  PHOS  --   --  2.7  --   --   --   --  3.3  --    < > = values in this interval not displayed.    GFR: Estimated Creatinine Clearance: 73 mL/min (by C-G formula based on SCr of 0.69 mg/dL).  Liver Function Tests: Recent Labs  Lab 09/05/23 1915 09/10/23 0159  AST 40  --   ALT 26  --   ALKPHOS 96  --   BILITOT 0.9  --   PROT 7.0  --   ALBUMIN 4.0 2.5*    CBG: Recent Labs  Lab 09/10/23 0613 09/10/23 1121 09/10/23 1552 09/10/23 2105 09/11/23 0610  GLUCAP 118* 138* 189* 166* 134*     Recent Results (from the past 240 hours)  Culture, blood (Routine X 2) w Reflex to ID Panel     Status: None   Collection Time: 09/05/23  7:15 PM   Specimen: BLOOD  Result Value Ref Range Status   Specimen Description   Final    BLOOD LEFT ANTECUBITAL Performed at [facility], [address], [place], [place] [address]    Special Requests   Final  BOTTLES DRAWN AEROBIC AND ANAEROBIC Blood Culture adequate volume Performed at [facility], [address], [place], [place] [address]    Culture   Final    NO GROWTH 5 DAYS Performed at [facility], [address] [place], [place] [address]    Report Status 09/10/2023 FINAL  Final  Culture, blood (Routine X 2) w Reflex to ID Panel     Status: None   Collection Time: 09/05/23  7:20 PM   Specimen: BLOOD  Result Value Ref Range Status   Specimen Description    Final    BLOOD RIGHT ANTECUBITAL Performed at [facility], [address], [place], [place] [address]    Special Requests   Final    BOTTLES DRAWN AEROBIC AND ANAEROBIC Blood Culture adequate volume Performed at [facility], [address] [place], [place] [address]    Culture   Final    NO GROWTH 5 DAYS Performed at [facility], [address], [place], [place] [address]    Report Status 09/10/2023 FINAL  Final  Resp panel by RT-PCR (RSV, Flu A&B, Covid) Anterior Nasal Swab     Status: None   Collection Time: 09/05/23  7:31 PM   Specimen: Anterior Nasal Swab  Result Value Ref Range Status   SARS Coronavirus 2 by RT PCR NEGATIVE NEGATIVE Final    Comment: (NOTE) SARS-CoV-2 target nucleic acids are NOT DETECTED.  The SARS-CoV-2 RNA is generally detectable in upper respiratory specimens during the acute phase of infection. The lowest concentration of SARS-CoV-2 viral copies this assay can detect is 138 copies/mL. A negative result does not preclude SARS-Cov-2 infection and should not be used as the sole basis for treatment or other patient management decisions. A negative result may occur with  improper specimen collection/handling, submission of specimen other than nasopharyngeal swab, presence of viral mutation(s) within the areas targeted by this assay, and inadequate number of viral copies(<138 copies/mL). A negative result must be combined with clinical observations, patient history, and epidemiological information. The expected result is Negative.  Fact Sheet for Patients:  BloggerCourse.com  Fact Sheet for Healthcare Providers:  SeriousBroker.it  This test is no t yet approved or cleared by the [place] FDA and  has been authorized for detection and/or diagnosis of SARS-CoV-2 by FDA under an Emergency Use Authorization (EUA). This EUA will remain  in effect (meaning  this test can be used) for the duration of the COVID-19 declaration under Section 564(b)(1) of the Act, 21 U.S.C.section 360bbb-3(b)(1), unless the authorization is terminated  or revoked sooner.       Influenza A by PCR NEGATIVE NEGATIVE Final   Influenza B by PCR NEGATIVE NEGATIVE Final    Comment: (NOTE) The Xpert Xpress SARS-CoV-2/FLU/RSV plus assay is intended as an aid in the diagnosis of influenza from Nasopharyngeal swab specimens and should not be used as a sole basis for treatment. Nasal washings and aspirates are unacceptable for Xpert Xpress SARS-CoV-2/FLU/RSV testing.  Fact Sheet for Patients: BloggerCourse.com  Fact Sheet for Healthcare Providers: SeriousBroker.it  This test is not yet approved or cleared by the [place] FDA and has been authorized for detection and/or diagnosis of SARS-CoV-2 by FDA under an Emergency Use Authorization (EUA). This EUA will remain in effect (meaning this test can be used) for the duration of the COVID-19 declaration under Section 564(b)(1) of the Act, 21 U.S.C. section 360bbb-3(b)(1), unless the authorization is terminated or revoked.     Resp Syncytial  Virus by PCR NEGATIVE NEGATIVE Final    Comment: (NOTE) Fact Sheet for Patients: BloggerCourse.com  Fact Sheet for Healthcare Providers: SeriousBroker.it  This test is not yet approved or cleared by the [place] FDA and has been authorized for detection and/or diagnosis of SARS-CoV-2 by FDA under an Emergency Use Authorization (EUA). This EUA will remain in effect (meaning this test can be used) for the duration of the COVID-19 declaration under Section 564(b)(1) of the Act, 21 U.S.C. section 360bbb-3(b)(1), unless the authorization is terminated or revoked.  Performed at [facility], [address] [place], [place] [address]   MRSA Next Gen by  PCR, Nasal     Status: None   Collection Time: 09/06/23 12:28 AM   Specimen: Nasal Mucosa; Nasal Swab  Result Value Ref Range Status   MRSA by PCR Next Gen NOT DETECTED NOT DETECTED Final    Comment: (NOTE) The GeneXpert MRSA Assay (FDA approved for NASAL specimens only), is one component of a comprehensive MRSA colonization surveillance program. It is not intended to diagnose MRSA infection nor to guide or monitor treatment for MRSA infections. Test performance is not FDA approved in patients less than 37 years old. Performed at [facility], [address], [place], [place] [address]   Respiratory (~20 pathogens) panel by PCR     Status: None   Collection Time: 09/07/23  4:20 AM   Specimen: Nasopharyngeal Swab; Respiratory  Result Value Ref Range Status   Adenovirus NOT DETECTED NOT DETECTED Final   Coronavirus 229E NOT DETECTED NOT DETECTED Final    Comment: (NOTE) The Coronavirus on the Respiratory Panel, DOES NOT test for the novel  Coronavirus (2019 nCoV)    Coronavirus HKU1 NOT DETECTED NOT DETECTED Final   Coronavirus NL63 NOT DETECTED NOT DETECTED Final   Coronavirus OC43 NOT DETECTED NOT DETECTED Final   Metapneumovirus NOT DETECTED NOT DETECTED Final   Rhinovirus / Enterovirus NOT DETECTED NOT DETECTED Final   Influenza A NOT DETECTED NOT DETECTED Final   Influenza B NOT DETECTED NOT DETECTED Final   Parainfluenza Virus 1 NOT DETECTED NOT DETECTED Final   Parainfluenza Virus 2 NOT DETECTED NOT DETECTED Final   Parainfluenza Virus 3 NOT DETECTED NOT DETECTED Final   Parainfluenza Virus 4 NOT DETECTED NOT DETECTED Final   Respiratory Syncytial Virus NOT DETECTED NOT DETECTED Final   Bordetella pertussis NOT DETECTED NOT DETECTED Final   Bordetella Parapertussis NOT DETECTED NOT DETECTED Final   Chlamydophila pneumoniae NOT DETECTED NOT DETECTED Final   Mycoplasma pneumoniae NOT DETECTED NOT DETECTED Final    Comment: Performed at [facility], [address] [place], [place] [address]         Radiology Studies: No results found.      Scheduled Meds:  amLODipine  10 mg Oral Daily   arformoterol  15 mcg Nebulization BID   aspirin  81 mg Oral Daily   atorvastatin  40 mg Oral Daily   budesonide (PULMICORT) nebulizer solution  0.5 mg Nebulization BID   Chlorhexidine Gluconate Cloth  6 each Topical Daily   clopidogrel  75 mg Oral Daily   docusate sodium  100 mg Oral BID   enoxaparin (LOVENOX) injection  40 mg Subcutaneous Daily   fluticasone  2 spray Each Nare Daily   insulin aspart  0-9 Units Subcutaneous TID WC   loratadine  10 mg Oral Daily   losartan  50 mg Oral Daily   nicotine  14 mg Transdermal  Daily   oseltamivir  75 mg Oral BID   pantoprazole  40 mg Oral Q0600   predniSONE  60 mg Oral QAC breakfast   Continuous Infusions:     LOS: 5 days    Time spent: 40 minutes    Ramiro Harvest, MD [facility]   To contact the attending provider between 7A-7P or the covering provider during after hours 7P-7A, please log into the web site [email or web address] and access using universal [place] password for that web site. If you do not have the password, please call the hospital operator.  09/11/2023, 10:57 AM

## 2023-09-11 NOTE — Plan of Care (Signed)

## 2023-09-12 ENCOUNTER — Other Ambulatory Visit (HOSPITAL_COMMUNITY): Payer: Self-pay

## 2023-09-12 DIAGNOSIS — I2510 Atherosclerotic heart disease of native coronary artery without angina pectoris: Secondary | ICD-10-CM | POA: Diagnosis not present

## 2023-09-12 DIAGNOSIS — J101 Influenza due to other identified influenza virus with other respiratory manifestations: Secondary | ICD-10-CM | POA: Diagnosis not present

## 2023-09-12 DIAGNOSIS — J9601 Acute respiratory failure with hypoxia: Secondary | ICD-10-CM | POA: Diagnosis not present

## 2023-09-12 DIAGNOSIS — J441 Chronic obstructive pulmonary disease with (acute) exacerbation: Secondary | ICD-10-CM | POA: Diagnosis not present

## 2023-09-12 LAB — BASIC METABOLIC PANEL
Anion gap: 9 (ref 5–15)
BUN: 16 mg/dL (ref 8–23)
CO2: 33 mmol/L — ABNORMAL HIGH (ref 22–32)
Calcium: 8.4 mg/dL — ABNORMAL LOW (ref 8.9–10.3)
Chloride: 97 mmol/L — ABNORMAL LOW (ref 98–111)
Creatinine, Ser: 0.66 mg/dL (ref 0.61–1.24)
GFR, Estimated: >60 mL/min (ref >60)
Glucose, Bld: 118 mg/dL — ABNORMAL HIGH (ref 70–99)
Potassium: 4.3 mmol/L (ref 3.5–5.1)
Sodium: 139 mmol/L (ref 135–145)

## 2023-09-12 LAB — GLUCOSE, CAPILLARY: Glucose-Capillary: 101 mg/dL — ABNORMAL HIGH (ref 70–99)

## 2023-09-12 LAB — TRIGLYCERIDES: Triglycerides: 56 mg/dL (ref <150)

## 2023-09-12 MED ORDER — LOSARTAN POTASSIUM 50 MG PO TABS
50.0000 mg | ORAL_TABLET | Freq: Every day | ORAL | 0 refills | Status: AC
  Filled 2023-09-12: qty 90, 90d supply, fill #0

## 2023-09-12 MED ORDER — NICOTINE 14 MG/24HR TD PT24
14.0000 mg | MEDICATED_PATCH | Freq: Every day | TRANSDERMAL | 0 refills | Status: AC
  Filled 2023-09-12: qty 28, 28d supply, fill #0

## 2023-09-12 MED ORDER — AMLODIPINE BESYLATE 10 MG PO TABS
10.0000 mg | ORAL_TABLET | Freq: Every day | ORAL | 0 refills | Status: AC
  Filled 2023-09-12: qty 90, 90d supply, fill #0

## 2023-09-12 MED ORDER — BUDESONIDE-FORMOTEROL FUMARATE 160-4.5 MCG/ACT IN AERO
2.0000 | INHALATION_SPRAY | Freq: Two times a day (BID) | RESPIRATORY_TRACT | 1 refills | Status: DC
  Filled 2023-09-12: qty 10.2, 30d supply, fill #0
  Filled 2023-09-12: qty 13, 30d supply, fill #0

## 2023-09-12 MED ORDER — FLUTICASONE PROPIONATE 50 MCG/ACT NA SUSP
2.0000 | Freq: Every day | NASAL | 0 refills | Status: DC
  Filled 2023-09-12: qty 16, 30d supply, fill #0

## 2023-09-12 MED ORDER — PREDNISONE 20 MG PO TABS
ORAL_TABLET | ORAL | 0 refills | Status: AC
  Filled 2023-09-12: qty 15, 8d supply, fill #0

## 2023-09-12 MED ORDER — LORATADINE 10 MG PO TABS
10.0000 mg | ORAL_TABLET | Freq: Every day | ORAL | 0 refills | Status: AC
  Filled 2023-09-12: qty 30, 30d supply, fill #0

## 2023-09-12 MED ORDER — PANTOPRAZOLE SODIUM 40 MG PO TBEC
40.0000 mg | DELAYED_RELEASE_TABLET | Freq: Every day | ORAL | 1 refills | Status: AC
  Filled 2023-09-12: qty 30, 30d supply, fill #0

## 2023-09-12 MED ORDER — ALBUTEROL SULFATE HFA 108 (90 BASE) MCG/ACT IN AERS
INHALATION_SPRAY | RESPIRATORY_TRACT | Status: AC
Start: 2023-09-12 — End: 2024-09-16

## 2023-09-12 NOTE — Discharge Summary (Signed)
 Physician Discharge Summary  Jackson Mathews ZOX:[id number] DOB: [birth year] DOA: 09/05/2023  PCP: Lavada Mesi, MD  Admit date: 09/05/2023 Discharge date: 09/12/2023  Time spent: 60 minutes  Recommendations for Outpatient Follow-up:  Follow-up with Hilts, Casimiro Needle, MD in 1 to 2 weeks.  On follow-up patient's blood pressure need to be reassessed as patient's antihypertensive medications were uptitrated and Cozaar added to patient's regimen.  Patient will need a basic metabolic profile done to follow-up on electrolytes and renal function.  Patient will need ambulatory sats done on follow-up. Ambulatory referral made for pulmonary for follow-up on COPD.  Patient may need formal PFTs done as well as management of COPD.   Discharge Diagnoses:  Principal Problem:   Respiratory failure with hypoxia and hypercapnia (HCC) Active Problems:   Nicotine dependence with current use   CAD in native artery-cath 09/24/15 non obstructive   Essential hypertension   HLD (hyperlipidemia)   Tobacco use   Respiratory failure (HCC)   COPD with acute exacerbation (HCC)   Influenza A   Discharge Condition: Stable and improved.  Diet recommendation: Regular  Filed Weights   09/10/23 0500 09/11/23 0538 09/12/23 0348  Weight: 57.3 kg 56.8 kg 56.9 kg    History of present illness:  HPI per Dr.Albustami Pt is encephelopathic; therefore, this HPI is obtained from chart review. Jackson Mathews is a 68 y.o. male who has a PMH as below including but not limited to COPD/emphysema (no PFTs in system but CXR does show emphysema), HTN, HLD, carotid artery occlusion, CAD 2017. He presented to [facility] 2/11 after being diagnosed 1 day prior with influenza A with home test per sisters report (he was started on Tamiflu by PCP). On 2/11, he developed worsening symptoms including respiratory distress prompting him to drive to the ED. In ED, he had AMS and was in significant distress to the point he required intubation  after failing BiPAP trial (had worsened ABG with worsening hypercapnia).    He was intubated and was later transferred to [facility] for further evaluation.   Head CT was negative. CXR showed possible RUL infiltrate. He was started on Steroids, Ceftriaxone and Azithromycin and had cultures drawn. Of note, Flu/RSV/COVID were negative in ED though he reportedly did test positive for flu A 2/10 with home test (and started on Tamiflu by PCP as mentioned above).  Hospital Course:  #1 acute hypercapnic respiratory failure in the setting of influenza A and COPD exacerbation -Status post mechanical ventilation extubated and O2 titrated down to 2 L nasal cannula.  -Patient noted to have presented to the ED with worsening symptoms of shortness of breath, cough noted to be in respiratory distress also noted to have altered mental status requiring intubation after failing BiPAP. -It is noted that patient had tested positive with influenza A with home test per sister's report. -CT head done on admission negative for any acute abnormalities. -Respiratory viral panel negative.  Blood cultures with no growth to date x 4 days.  MRSA PCR negative. -SARS coronavirus 2 PCR negative. Influenza A by PCR negative, influenza B by PCR negative, RSV by PCR negative. -Patient maintained on scheduled DuoNebs, Pulmicort, Brovana, Claritin, PPI, Flonase, IV Solu-Medrol which was subsequently tapered to prednisone taper.   -Patient improved clinically during the hospitalization and also completed a course of Tamiflu.   -Patient was discharged in stable improved condition on home O2.  -Ambulatory referral made to pulmonary for follow-up in the outpatient setting, may need PFTs and further evaluation and management  of COPD.   -Follow-up with PCP.   -Patient will be discharged in stable and improved condition.    2.  Probable influenza A -Patient noted to have tested positive at home 2/10 but negative in the ED 2/11. -Respiratory  viral panel negative. -Patient treated empirically with Tamiflu for 5 days during the hospitalization.   -Patient improved clinically.   -Outpatient follow-up.    3.  Hyponatremia -Resolved. -Urine osmolality and urine sodium suggested hypovolemia and improved with hydration.   4.  Hypertension -Patient noted to be on amlodipine prior to admission which was uptitrated to 10 mg daily and patient started on Cozaar 25 mg daily and dose uptitrated to 50 mg daily for better blood glucose control. -Patient maintained on IV hydralazine. -Patient will be discharged on Norvasc 10 mg daily, Cozaar 50 mg daily. -Outpatient follow-up with PCP.   5.  Hyperlipidemia -Statin.     6.  CAD, carotid artery occlusion status post right CEA 2017 -Patient maintained on home antihypertensive medications, as needed hydralazine. -Patient also maintained on home regimen aspirin, clopidogrel, statin.   7.  Hyperglycemia -Likely secondary to steroids. -Hemoglobin A1c 5.4. -Patient maintained on sliding scale insulin during the hospitalization.    8.  Tobacco abuse -Tobacco cessation stressed to patient. -Patient placed on a nicotine patch. -Outpatient follow-up.    Procedures: CT head 09/05/2023 Chest x-ray 09/05/2023, 09/06/2023 2D echo 09/06/2023   Significant Hospital Events: Including procedures, antibiotic start and stop dates in addition to other pertinent events   2/11 intubated, admit  2/12 tolerated SBT 2/13 tolerated SBT  Consultations: PCCM admission   Discharge Exam: Vitals:   09/12/23 0735 09/12/23 0912  BP: (!) 143/77   Pulse: 72 86  Resp: 19 16  Temp: 98.2 F (36.8 C)   SpO2: 98% 96%    General: NAD Cardiovascular: RRR no murmurs rubs or gallops.  No JVD.  No pitting lower extremity edema. Respiratory: Minimal expiratory wheezing.  Fair air movement.  No crackles.  No rhonchi.  Speaking in full sentences.  Discharge Instructions   Discharge Instructions      Ambulatory referral to Pulmonology   Complete by: As directed    HFU   Reason for referral: Asthma/COPD   Bipap   Complete by: As directed    Diet general   Complete by: As directed    Increase activity slowly   Complete by: As directed       Allergies as of 09/12/2023       Reactions   Codeine Nausea Only        Medication List     STOP taking these medications    oseltamivir 75 MG capsule Commonly known as: TAMIFLU       TAKE these medications    albuterol 108 (90 Base) MCG/ACT inhaler Commonly known as: VENTOLIN HFA Inhale 2 puffs into the lungs 3 (three) times daily for 5 days, THEN 2 puffs every 6 (six) hours as needed. Start taking on: September 12, 2023 What changed: See the new instructions.   amLODipine 10 MG tablet Commonly known as: NORVASC Take 1 tablet (10 mg total) by mouth daily. Start taking on: September 13, 2023   aspirin EC 81 MG tablet Take 1 tablet (81 mg total) by mouth daily. Swallow whole.   atorvastatin 40 MG tablet Commonly known as: Lipitor Take 1 tablet (40 mg total) by mouth daily.   clopidogrel 75 MG tablet Commonly known as: PLAVIX Take 75 mg by mouth daily.  fluticasone 50 MCG/ACT nasal spray Commonly known as: FLONASE Place 2 sprays into both nostrils daily for 7 days. Start taking on: September 13, 2023   Hydromet 5-1.5 MG/5ML syrup Generic drug: HYDROcodone bit-homatropine Take 5 mLs by mouth every 4 (four) hours as needed for cough.   loratadine 10 MG tablet Commonly known as: CLARITIN Take 1 tablet (10 mg total) by mouth daily. Start taking on: September 13, 2023   losartan 50 MG tablet Commonly known as: COZAAR Take 1 tablet (50 mg total) by mouth daily. Start taking on: September 13, 2023   mometasone-formoterol 200-5 MCG/ACT Aero Commonly known as: DULERA Inhale 2 puffs into the lungs 2 (two) times daily.   Nebulizer Devi 1 Device by Does not apply route as needed.   nicotine 14 mg/24hr  patch Commonly known as: NICODERM CQ - dosed in mg/24 hours Place 1 patch (14 mg total) onto the skin daily. Start taking on: September 13, 2023   pantoprazole 40 MG tablet Commonly known as: PROTONIX Take 1 tablet (40 mg total) by mouth daily at 6 (six) AM. Start taking on: September 13, 2023   predniSONE 20 MG tablet Commonly known as: DELTASONE Take 3 tablets (60 mg total) by mouth daily before breakfast for 2 days, THEN 2 tablets (40 mg total) daily before breakfast for 3 days, THEN 1 tablet (20 mg total) daily before breakfast for 3 days. Start taking on: September 13, 2023 What changed:  medication strength See the new instructions.   SYSTANE OP Place 1 drop into both eyes daily as needed (dry eyes).               Durable Medical Equipment  (From admission, onward)           Start     Ordered   09/11/23 1222  For home use only DME oxygen  Once       Question Answer Comment  Length of Need 6 Months   Mode or (Route) Nasal cannula   Liters per Minute 2   Frequency Continuous (stationary and portable oxygen unit needed)   Oxygen conserving device Yes   Oxygen delivery system Gas      09/11/23 1221   09/09/23 1912  For home use only DME 3 n 1  Once        09/09/23 1911           Allergies  Allergen Reactions   Codeine Nausea Only    Follow-up Information     [facility] Follow up.   Why: home oxygen Contact information: [address] [place] [address] [phone]         Hilts, Casimiro Needle, MD. Schedule an appointment as soon as possible for a visit in 2 week(s).   Specialty: Family Medicine Why: f/u in 1-2 weeks. Contact information: [address] [place] [address] [phone]         [facility] at [facility] Schedule an appointment as soon as possible for a visit in 4 week(s).   Specialty: Pulmonology Why: Office will call with appointment time for referral otherwise may follow-up in 3  to 4 weeks. Contact information: [address] [place] [address] [phone]                 The results of significant diagnostics from this hospitalization (including imaging, microbiology, ancillary and laboratory) are listed below for reference.    Significant Diagnostic Studies: ECHOCARDIOGRAM COMPLETE Result  Date: 09/06/2023    ECHOCARDIOGRAM REPORT   Patient Name:   ERAGON HAMMOND Date of Exam: 09/06/2023 Medical Rec #:  [id number]          Height:       69.0 in Accession #:    [id number]         Weight:       138.0 lb Date of Birth:  September 21, 1955          BSA:          1.765 m Patient Age:    67 years           BP:           108/66 mmHg Patient Gender: M                  HR:           73 bpm. Exam Location:  Inpatient Procedure: 2D Echo, Cardiac Doppler and Color Doppler (Both Spectral and Color            Flow Doppler were utilized during procedure). Indications:    Shock  History:        Patient has prior history of Echocardiogram examinations, most                 recent 06/10/2022. CAD, COPD, Signs/Symptoms:Murmur and Dyspnea;                 Risk Factors:Current Smoker, Hypertension and Dyslipidemia.                 Carotid disease.  Sonographer:    Vern Claude Referring Phys: Patrici Ranks IMPRESSIONS  1. Left ventricular ejection fraction, by estimation, is 50 to 55%. Left ventricular ejection fraction by 2D MOD biplane is 50.4 %. The left ventricle has low normal function. The left ventricle has no regional wall motion abnormalities. Left ventricular diastolic parameters are consistent with Grade I diastolic dysfunction (impaired relaxation).  2. Right ventricular systolic function is normal. The right ventricular size is normal. Tricuspid regurgitation signal is inadequate for assessing PA pressure.  3. The mitral valve is grossly normal. Trivial mitral valve regurgitation.  4. The aortic valve is tricuspid. Aortic valve regurgitation is not  visualized. Aortic valve sclerosis/calcification is present, without any evidence of aortic stenosis.  5. The inferior vena cava is dilated in size with <50% respiratory variability, suggesting right atrial pressure of 15 mmHg. Comparison(s): Changes from prior study are noted. 06/10/2022: LVEF 55-60%. FINDINGS  Left Ventricle: Left ventricular ejection fraction, by estimation, is 50 to 55%. Left ventricular ejection fraction by 2D MOD biplane is 50.4 %. The left ventricle has low normal function. The left ventricle has no regional wall motion abnormalities. Strain imaging was not performed. The left ventricular internal cavity size was normal in size. There is no left ventricular hypertrophy. Left ventricular diastolic parameters are consistent with Grade I diastolic dysfunction (impaired relaxation). Indeterminate filling pressures. Right Ventricle: The right ventricular size is normal. No increase in right ventricular wall thickness. Right ventricular systolic function is normal. Tricuspid regurgitation signal is inadequate for assessing PA pressure. Left Atrium: Left atrial size was normal in size. Right Atrium: Right atrial size was normal in size. Pericardium: There is no evidence of pericardial effusion. Mitral Valve: The mitral valve is grossly normal. Trivial mitral valve regurgitation. MV peak gradient, 2.7 mmHg. The mean mitral valve gradient is 1.0 mmHg. Tricuspid Valve: The tricuspid valve is normal in structure. Tricuspid valve regurgitation  is not demonstrated. Aortic Valve: The aortic valve is tricuspid. Aortic valve regurgitation is not visualized. Aortic valve sclerosis/calcification is present, without any evidence of aortic stenosis. Aortic valve mean gradient measures 2.0 mmHg. Aortic valve peak gradient measures 3.1 mmHg. Aortic valve area, by VTI measures 2.50 cm. Pulmonic Valve: The pulmonic valve was normal in structure. Pulmonic valve regurgitation is not visualized. Aorta: The aortic root  and ascending aorta are structurally normal, with no evidence of dilitation. Venous: The inferior vena cava is dilated in size with less than 50% respiratory variability, suggesting right atrial pressure of 15 mmHg. IAS/Shunts: No atrial level shunt detected by color flow Doppler. Additional Comments: 3D imaging was not performed.  LEFT VENTRICLE PLAX 2D                        Biplane EF (MOD) LVIDd:         4.30 cm         LV Biplane EF:   Left LVIDs:         2.60 cm                          ventricular LV PW:         0.70 cm                          ejection LV IVS:        0.80 cm                          fraction by LVOT diam:     1.90 cm                          2D MOD LV SV:         38                               biplane is LV SV Index:   22                               50.4 %. LVOT Area:     2.84 cm                                Diastology                                LV e' medial:    5.22 cm/s LV Volumes (MOD)               LV E/e' medial:  12.0 LV vol d, MOD    129.4 ml      LV e' lateral:   7.83 cm/s A2C:                           LV E/e' lateral: 8.0 LV vol d, MOD    92.0 ml A4C: LV vol s, MOD    66.2 ml A2C: LV vol s, MOD    39.5 ml A4C: LV SV MOD A2C:   63.2 ml LV SV MOD A4C:  92.0 ml LV SV MOD BP:    54.6 ml RIGHT VENTRICLE             IVC RV Basal diam:  2.20 cm     IVC diam: 2.30 cm RV Mid diam:    1.60 cm RV S prime:     12.60 cm/s TAPSE (M-mode): 1.5 cm LEFT ATRIUM           Index        RIGHT ATRIUM          Index LA diam:      1.90 cm 1.08 cm/m   RA Area:     3.04 cm LA Vol (A2C): 40.5 ml 22.95 ml/m  RA Volume:   2.88 ml  1.63 ml/m LA Vol (A4C): 17.5 ml 9.92 ml/m  AORTIC VALVE                    PULMONIC VALVE AV Area (Vmax):    2.20 cm     PV Vmax:       0.67 m/s AV Area (Vmean):   1.86 cm     PV Peak grad:  1.8 mmHg AV Area (VTI):     2.50 cm AV Vmax:           87.40 cm/s AV Vmean:          59.600 cm/s AV VTI:            0.152 m AV Peak Grad:      3.1 mmHg AV Mean Grad:      2.0  mmHg LVOT Vmax:         67.90 cm/s LVOT Vmean:        39.100 cm/s LVOT VTI:          0.134 m LVOT/AV VTI ratio: 0.88  AORTA Ao Root diam: 3.50 cm Ao Asc diam:  2.70 cm MITRAL VALVE MV Area (PHT): 4.39 cm    SHUNTS MV Area VTI:   1.92 cm    Systemic VTI:  0.13 m MV Peak grad:  2.7 mmHg    Systemic Diam: 1.90 cm MV Mean grad:  1.0 mmHg MV Vmax:       0.82 m/s MV Vmean:      43.8 cm/s MV Decel Time: 173 msec MV E velocity: 62.40 cm/s MV A velocity: 60.80 cm/s MV E/A ratio:  1.03 Zoila Shutter MD Electronically signed by Zoila Shutter MD Signature Date/Time: 09/06/2023/2:51:04 PM    Final    DG CHEST PORT 1 VIEW Result Date: 09/06/2023 CLINICAL DATA:  Check endotracheal tube placement EXAM: PORTABLE CHEST 1 VIEW COMPARISON:  09/06/2023 FINDINGS: Endotracheal tube and gastric catheter are noted in satisfactory position. Cardiac shadow is within normal limits. Lungs are well aerated bilaterally. No focal infiltrate is seen. No bony abnormality is noted. IMPRESSION: Tubes and lines as described above.  No change from the prior exam. Electronically Signed   By: Alcide Clever M.D.   On: 09/06/2023 09:35   DG CHEST PORT 1 VIEW Result Date: 09/06/2023 CLINICAL DATA:  68 year old male status post intubation. EXAM: PORTABLE CHEST 1 VIEW COMPARISON:  Chest x-ray 09/05/2023. FINDINGS: An endotracheal tube is in place with tip 5.8 cm above the carina. A nasogastric tube is seen extending into the stomach, however, the tip of the nasogastric tube extends below the lower margin of the image. Lung volumes are normal. No consolidative airspace disease. Left costophrenic sulcus is incompletely imaged. No definite right pleural effusion. No pneumothorax.  No pulmonary nodule or mass noted. Pulmonary vasculature and the cardiomediastinal silhouette are within normal limits. Atherosclerosis in the thoracic aorta. IMPRESSION: 1. Support apparatus, as above. 2. No radiographic evidence of acute cardiopulmonary disease. 3. Aortic  atherosclerosis. Electronically Signed   By: Trudie Reed M.D.   On: 09/06/2023 06:53   DG Abdomen 1 View Result Date: 09/05/2023 CLINICAL DATA:  Status post NG tube placement. EXAM: ABDOMEN - 1 VIEW COMPARISON:  None Available. FINDINGS: An enteric tube is seen with its distal tip noted within the body of the stomach. The distal side hole sits approximately 2.8 cm distal to the expected region of the gastroesophageal junction. The bowel gas pattern is normal. No radio-opaque calculi or other significant radiographic abnormality are seen. IMPRESSION: Enteric tube positioning, as described above. Electronically Signed   By: Aram Candela M.D.   On: 09/05/2023 22:38   DG Chest Portable 1 View Result Date: 09/05/2023 CLINICAL DATA:  Status post intubation. EXAM: PORTABLE CHEST 1 VIEW COMPARISON:  September 05, 2023 (7:17 p.m.) FINDINGS: Since the prior study there is been interval placement of an endotracheal tube. Its distal tip sits approximally 7.9 cm from the carina. The heart size and mediastinal contours are within normal limits. The lungs are hyperinflated with mild, diffuse, chronic appearing increased interstitial lung markings. No pleural effusion or pneumothorax is identified. The visualized skeletal structures are unremarkable. IMPRESSION: Interval endotracheal tube placement, as described above, without acute or active cardiopulmonary disease. Electronically Signed   By: Aram Candela M.D.   On: 09/05/2023 22:37   CT Head Wo Contrast Result Date: 09/05/2023 CLINICAL DATA:  Mental status change, unknown cause EXAM: CT HEAD WITHOUT CONTRAST TECHNIQUE: Contiguous axial images were obtained from the base of the skull through the vertex without intravenous contrast. RADIATION DOSE REDUCTION: This exam was performed according to the departmental dose-optimization program which includes automated exposure control, adjustment of the mA and/or kV according to patient size and/or use of iterative  reconstruction technique. COMPARISON:  CTA head/neck June 14, 2021 FINDINGS: Mildly motion limited study. Brain: No evidence of acute infarction, hemorrhage, hydrocephalus, extra-axial collection or mass lesion/mass effect. Vascular: No hyperdense vessel identified. Calcific atherosclerosis. Skull: No acute fracture. Sinuses/Orbits: Clear sinuses.  No acute orbital findings. Other: No mastoid effusions. IMPRESSION: Stable head CT.  No evidence of acute intracranial abnormality. Electronically Signed   By: Feliberto Harts M.D.   On: 09/05/2023 21:33   DG Chest Portable 1 View Result Date: 09/05/2023 CLINICAL DATA:  Shortness of breath EXAM: PORTABLE CHEST 1 VIEW COMPARISON:  10/12/2018 FINDINGS: Hyperinflation and emphysema. Mild diffuse increased interstitial opacity suggesting acute on chronic bronchitic changes. Possible developing infiltrate in the right upper lobe. No pleural effusion or pneumothorax. Normal cardiac size IMPRESSION: Hyperinflation and emphysema with mild diffuse increased interstitial opacity suggesting acute on chronic bronchitic changes. Possible developing infiltrate in the right upper lobe. Radiographic follow-up to resolution is recommended Electronically Signed   By: Jasmine Pang M.D.   On: 09/05/2023 20:08    Microbiology: Recent Results (from the past 240 hours)  Culture, blood (Routine X 2) w Reflex to ID Panel     Status: None   Collection Time: 09/05/23  7:15 PM   Specimen: BLOOD  Result Value Ref Range Status   Specimen Description   Final    BLOOD LEFT ANTECUBITAL Performed at [facility], [address], [place], [place] [address]    Special Requests   Final    BOTTLES DRAWN AEROBIC AND  ANAEROBIC Blood Culture adequate volume Performed at [facility], [address] [place], [place] [address]    Culture   Final    NO GROWTH 5 DAYS Performed at [facility], [address] [place], [place] [address]     Report Status 09/10/2023 FINAL  Final  Culture, blood (Routine X 2) w Reflex to ID Panel     Status: None   Collection Time: 09/05/23  7:20 PM   Specimen: BLOOD  Result Value Ref Range Status   Specimen Description   Final    BLOOD RIGHT ANTECUBITAL Performed at [facility], [address] [place], [place] [address]    Special Requests   Final    BOTTLES DRAWN AEROBIC AND ANAEROBIC Blood Culture adequate volume Performed at [facility], [address], [place], [place] [address]    Culture   Final    NO GROWTH 5 DAYS Performed at [facility], [address], [place], [place] [address]    Report Status 09/10/2023 FINAL  Final  Resp panel by RT-PCR (RSV, Flu A&B, Covid) Anterior Nasal Swab     Status: None   Collection Time: 09/05/23  7:31 PM   Specimen: Anterior Nasal Swab  Result Value Ref Range Status   SARS Coronavirus 2 by RT PCR NEGATIVE NEGATIVE Final    Comment: (NOTE) SARS-CoV-2 target nucleic acids are NOT DETECTED.  The SARS-CoV-2 RNA is generally detectable in upper respiratory specimens during the acute phase of infection. The lowest concentration of SARS-CoV-2 viral copies this assay can detect is 138 copies/mL. A negative result does not preclude SARS-Cov-2 infection and should not be used as the sole basis for treatment or other patient management decisions. A negative result may occur with  improper specimen collection/handling, submission of specimen other than nasopharyngeal swab, presence of viral mutation(s) within the areas targeted by this assay, and inadequate number of viral copies(<138 copies/mL). A negative result must be combined with clinical observations, patient history, and epidemiological information. The expected result is Negative.  Fact Sheet for Patients:  BloggerCourse.com  Fact Sheet for Healthcare Providers:  SeriousBroker.it  This test  is no t yet approved or cleared by the [place] FDA and  has been authorized for detection and/or diagnosis of SARS-CoV-2 by FDA under an Emergency Use Authorization (EUA). This EUA will remain  in effect (meaning this test can be used) for the duration of the COVID-19 declaration under Section 564(b)(1) of the Act, 21 U.S.C.section 360bbb-3(b)(1), unless the authorization is terminated  or revoked sooner.       Influenza A by PCR NEGATIVE NEGATIVE Final   Influenza B by PCR NEGATIVE NEGATIVE Final    Comment: (NOTE) The Xpert Xpress SARS-CoV-2/FLU/RSV plus assay is intended as an aid in the diagnosis of influenza from Nasopharyngeal swab specimens and should not be used as a sole basis for treatment. Nasal washings and aspirates are unacceptable for Xpert Xpress SARS-CoV-2/FLU/RSV testing.  Fact Sheet for Patients: BloggerCourse.com  Fact Sheet for Healthcare Providers: SeriousBroker.it  This test is not yet approved or cleared by the [place] FDA and has been authorized for detection and/or diagnosis of SARS-CoV-2 by FDA under an Emergency Use Authorization (EUA). This EUA will remain in effect (meaning this test can be used) for the duration of the COVID-19 declaration under Section 564(b)(1) of the Act, 21 U.S.C. section 360bbb-3(b)(1), unless the authorization is terminated or revoked.     Resp Syncytial Virus by PCR NEGATIVE  NEGATIVE Final    Comment: (NOTE) Fact Sheet for Patients: BloggerCourse.com  Fact Sheet for Healthcare Providers: SeriousBroker.it  This test is not yet approved or cleared by the [place] FDA and has been authorized for detection and/or diagnosis of SARS-CoV-2 by FDA under an Emergency Use Authorization (EUA). This EUA will remain in effect (meaning this test can be used) for the duration of the COVID-19 declaration under Section  564(b)(1) of the Act, 21 U.S.C. section 360bbb-3(b)(1), unless the authorization is terminated or revoked.  Performed at [facility], [address], [place], [place] [address]   MRSA Next Gen by PCR, Nasal     Status: None   Collection Time: 09/06/23 12:28 AM   Specimen: Nasal Mucosa; Nasal Swab  Result Value Ref Range Status   MRSA by PCR Next Gen NOT DETECTED NOT DETECTED Final    Comment: (NOTE) The GeneXpert MRSA Assay (FDA approved for NASAL specimens only), is one component of a comprehensive MRSA colonization surveillance program. It is not intended to diagnose MRSA infection nor to guide or monitor treatment for MRSA infections. Test performance is not FDA approved in patients less than 41 years old. Performed at [facility], [address], [place], [place] [address]   Respiratory (~20 pathogens) panel by PCR     Status: None   Collection Time: 09/07/23  4:20 AM   Specimen: Nasopharyngeal Swab; Respiratory  Result Value Ref Range Status   Adenovirus NOT DETECTED NOT DETECTED Final   Coronavirus 229E NOT DETECTED NOT DETECTED Final    Comment: (NOTE) The Coronavirus on the Respiratory Panel, DOES NOT test for the novel  Coronavirus (2019 nCoV)    Coronavirus HKU1 NOT DETECTED NOT DETECTED Final   Coronavirus NL63 NOT DETECTED NOT DETECTED Final   Coronavirus OC43 NOT DETECTED NOT DETECTED Final   Metapneumovirus NOT DETECTED NOT DETECTED Final   Rhinovirus / Enterovirus NOT DETECTED NOT DETECTED Final   Influenza A NOT DETECTED NOT DETECTED Final   Influenza B NOT DETECTED NOT DETECTED Final   Parainfluenza Virus 1 NOT DETECTED NOT DETECTED Final   Parainfluenza Virus 2 NOT DETECTED NOT DETECTED Final   Parainfluenza Virus 3 NOT DETECTED NOT DETECTED Final   Parainfluenza Virus 4 NOT DETECTED NOT DETECTED Final   Respiratory Syncytial Virus NOT DETECTED NOT DETECTED Final   Bordetella pertussis NOT DETECTED NOT DETECTED Final    Bordetella Parapertussis NOT DETECTED NOT DETECTED Final   Chlamydophila pneumoniae NOT DETECTED NOT DETECTED Final   Mycoplasma pneumoniae NOT DETECTED NOT DETECTED Final    Comment: Performed at [facility], [address] [place], [place] [address]     Labs: Basic Metabolic Panel: Recent Labs  Lab 09/05/23 1915 09/05/23 1933 09/06/23 0832 09/06/23 1150 09/08/23 1053 09/09/23 0805 09/10/23 0159 09/11/23 0209 09/12/23 0752  NA 121*   < > 123*   < > 138 138 139 138 139  K 3.5   < > 5.1   < > 4.0 4.0 4.0 3.9 4.3  CL 79*  --  84*   < > 90* 94* 96* 99 97*  CO2 28  --  28   < > 34* 32 32 31 33*  GLUCOSE 284*  --  127*   < > 130* 80 106* 165* 118*  BUN 14  --  18   < > 11 15 18 20 16   CREATININE 0.74  --  0.79   < > 0.70 0.71 0.61 0.69 0.66  CALCIUM 9.2  --  8.4*   < > 8.7* 8.8* 8.6* 8.4* 8.4*  MG 1.9  --  2.4  --   --  2.0 1.9 1.9  --   PHOS  --   --  2.7  --   --   --  3.3  --   --    < > = values in this interval not displayed.   Liver Function Tests: Recent Labs  Lab 09/05/23 1915 09/10/23 0159  AST 40  --   ALT 26  --   ALKPHOS 96  --   BILITOT 0.9  --   PROT 7.0  --   ALBUMIN 4.0 2.5*   No results for input(s): "LIPASE", "AMYLASE" in the last 168 hours. No results for input(s): "AMMONIA" in the last 168 hours. CBC: Recent Labs  Lab 09/05/23 1915 09/05/23 1933 09/07/23 0223 09/08/23 1053 09/09/23 0805 09/10/23 0159 09/11/23 0209  WBC 10.9*   < > 7.4 11.3* 12.1* 6.8 8.0  NEUTROABS 8.3*  --   --   --   --  4.0 5.0  HGB 15.1   < > 11.4* 14.1 15.7 14.6 14.3  HCT 46.4   < > 33.9* 42.9 47.5 44.8 44.0  MCV 90.1   < > 88.3 90.7 90.3 90.1 89.2  PLT 332   < > 241 307 337 308 320   < > = values in this interval not displayed.   Cardiac Enzymes: No results for input(s): "CKTOTAL", "CKMB", "CKMBINDEX", "TROPONINI" in the last 168 hours. BNP: BNP (last 3 results) No results for input(s): "BNP" in the last 8760 hours.  ProBNP (last 3 results) No results  for input(s): "PROBNP" in the last 8760 hours.  CBG: Recent Labs  Lab 09/10/23 2105 09/11/23 0610 09/11/23 1140 09/11/23 1612 09/12/23 0624  GLUCAP 166* 134* 185* 163* 101*       Signed:  Ramiro Harvest MD.  [facility] 09/12/2023, 10:53 AM

## 2023-09-12 NOTE — Plan of Care (Signed)

## 2023-09-12 NOTE — TOC Transition Note (Signed)
 Transition of Care [facility]) - Discharge Note   Patient Details  Name: Jackson Mathews MRN: [id number] Date of Birth: 1956/06/01  [facility]) CM/SW Contact:  Leone Haven, RN Phone Number: 09/12/2023, 10:55 AM   Clinical Narrative:    For dc today, Jermaine with [employer] states oxygen has been delivered to patient room.    Final next level of care: OP Rehab Barriers to Discharge: Continued Medical Work up   Patient Goals and CMS Choice Patient states their goals for this hospitalization and ongoing recovery are:: return home   Choice offered to / list presented to : Patient      Discharge Placement                       Discharge Plan and Services Additional resources added to the After Visit Summary for                  DME Arranged: Oxygen DME Agency: [employer] Date DME Agency Contacted: 09/11/23 Time DME Agency Contacted: [phone] Representative spoke with at DME Agency: Acquanetta Belling            Social Drivers of Health (SDOH) Interventions SDOH Screenings   Food Insecurity: Patient Unable To Answer (09/06/2023)  Housing: Patient Unable To Answer (09/06/2023)  Transportation Needs: Patient Unable To Answer (09/06/2023)  Utilities: Patient Unable To Answer (09/06/2023)  Social Connections: Patient Unable To Answer (09/06/2023)  Tobacco Use: High Risk (09/05/2023)     Readmission Risk Interventions     No data to display

## 2023-11-08 ENCOUNTER — Encounter: Payer: Self-pay | Admitting: Internal Medicine

## 2023-11-08 ENCOUNTER — Ambulatory Visit (INDEPENDENT_AMBULATORY_CARE_PROVIDER_SITE_OTHER): Payer: Medicare Other | Admitting: Internal Medicine

## 2023-11-08 VITALS — BP 134/86 | HR 86 | Ht 69.0 in | Wt 131.0 lb

## 2023-11-08 DIAGNOSIS — J9611 Chronic respiratory failure with hypoxia: Secondary | ICD-10-CM

## 2023-11-08 DIAGNOSIS — F17210 Nicotine dependence, cigarettes, uncomplicated: Secondary | ICD-10-CM | POA: Diagnosis not present

## 2023-11-08 DIAGNOSIS — Z122 Encounter for screening for malignant neoplasm of respiratory organs: Secondary | ICD-10-CM

## 2023-11-08 DIAGNOSIS — J9612 Chronic respiratory failure with hypercapnia: Secondary | ICD-10-CM

## 2023-11-08 DIAGNOSIS — J432 Centrilobular emphysema: Secondary | ICD-10-CM | POA: Diagnosis not present

## 2023-11-08 DIAGNOSIS — F17200 Nicotine dependence, unspecified, uncomplicated: Secondary | ICD-10-CM

## 2023-11-08 NOTE — Progress Notes (Signed)
 Jackson Mathews    [id number]    [birth year]  Primary Care Physician:Hilts, Casimiro Needle, MD Date of Appointment: 11/08/2023 Established Patient Visit  Chief complaint:   Chief Complaint  Patient presents with   Consult    Copd, patient states sob is not getting any better.   Discussed the use of AI scribe software for clinical note transcription with the patient, who gave verbal consent to proceed.  HPI: Jackson Mathews is a 68 year old male with emphysema who presents with shortness of breath and for smoking cessation counseling.  He was hospitalized in February with influenza A, requiring ICU admission and mechanical ventilation for a couple of days. He was treated with Tamiflu and antibiotics and discharged with supplemental oxygen  Interval Updates: History of Present Illness Here for follow up from hospital. Here with his sister. Not using oxygen regularly, not dropping below 91%. Over time, he feels better but experiences shortness of breath, particularly when walking short distances or performing activities involving his arms.  Discharged with symbicort. Not taking it. Uses albuterol prn less than once/week.  He has a significant smoking history, having started at age 33 and currently smoking about a pack and a half per day, though he has smoked up to two packs a day in the past. He has attempted to quit smoking multiple times, including a period of four to five months of cessation, but has relapsed. He acknowledges the habit is partly due to boredom and habit.  He has a history of carotid artery disease, having undergone a heart catheterization and carotid endarterectomy in 2017. He reports no family history of lung cancer but notes that his grandmother on his father's side had emphysema.  No frequent respiratory infections and has not had breathing tests before. He uses albuterol as a rescue inhaler but does not use it regularly, only when he feels particularly  winded. He does not currently use a maintenance inhaler.  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 He enjoys spending time at [employer] and has a boat, which he uses for fishing.  I have reviewed the patient's family social and past medical history and updated as appropriate.   Past Medical History:  Diagnosis Date   CAD in native artery-cath 09/24/15 non obstructive 09/24/2015   Calf pain    Cancer (HCC)    Skin   Carotid artery occlusion    Chest pain    COPD (chronic obstructive pulmonary disease) (HCC)    Dyspnea    Hearing loss    Heart murmur    History of kidney stones    History of right-sided carotid endarterectomy 09/23/2015   Hx of colonic polyp - ssp 06/21/2015   Hypercholesteremia    Hypertension    Hypoxia 10/12/2018   Lower back pain    Palpitations    Tinnitus    Tobacco use 09/23/2015   Vertigo     Past Surgical History:  Procedure Laterality Date   CARDIAC CATHETERIZATION N/A 09/24/2015   Procedure: Left Heart Cath and Coronary Angiography;  Surgeon: Kathleene Hazel, MD;  Location: [facility] INVASIVE CV LAB;  Service: Cardiovascular;  Laterality: N/A;   CAROTID ENDARTERECTOMY  05/05/11   Right CEA   MELANOMA EXCISION     l eye   SKIN CANCER EXCISION     TRANSCAROTID ARTERY REVASCULARIZATION  Right 06/28/2021   Procedure: RIGHT TRANSCAROTID ARTERY REVASCULARIZATION;  Surgeon: Kayla Part, MD;  Location[facility];  Service: Vascular;  Laterality: Right;    Family History  Problem Relation Age of Onset   Heart disease Mother    Cancer Father    Heart attack Father        s/p CABG   Diabetes Brother    Colon cancer Neg Hx    Esophageal cancer Neg Hx    Ulcerative colitis Neg Hx    Stomach cancer Neg Hx    Rectal cancer Neg Hx     Social History   Occupational History   Not on file  Tobacco Use   Smoking status: Every Day    Current packs/day: 2.00    Average packs/day: 2.0  packs/day for 30.0 years (60.0 ttl pk-yrs)    Types: Cigarettes   Smokeless tobacco: Never   Tobacco comments:    Smokes 1 or 11/2 pack of cigarettes  a day. [facility] 11/08/2022  Vaping Use   Vaping status: Never Used  Substance and Sexual Activity   Alcohol use: No    Alcohol/week: 0.0 standard drinks of alcohol   Drug use: No   Sexual activity: Not on file     Physical Exam: Blood pressure 134/86, pulse 86, height 5\' 9"  (1.753 m), weight 131 lb (59.4 kg), SpO2 97%.  Gen:      No acute distress ENT:  no nasal polyps, mucus membranes moist Lungs:   diminished, no wheezes or crackles CV:         Regular rate and rhythm; no murmurs, rubs, or gallops.  No pedal edema   Data Reviewed: Imaging: I have personally reviewed the chest xray feb 2025 - hyperinflation  PFTs:      No data to display         I have personally reviewed the patient's PFTs and   Labs: Lab Results  Component Value Date   NA 139 09/12/2023   K 4.3 09/12/2023   CO2 33 (H) 09/12/2023   GLUCOSE 118 (H) 09/12/2023   BUN 16 09/12/2023   CREATININE 0.66 09/12/2023   CALCIUM 8.4 (L) 09/12/2023   GFRNONAA >60 09/12/2023   Lab Results  Component Value Date   WBC 8.0 09/11/2023   HGB 14.3 09/11/2023   HCT 44.0 09/11/2023   MCV 89.2 09/11/2023   PLT 320 09/11/2023   ABG shows hypercapnia, chronic Immunization status:  There is no immunization history on file for this patient.  External Records Personally Reviewed: hospital stay  Assessment and Plan Assessment & Plan Emphysema Symptoms and history suggest emphysema with chronic hypercapnia. Pulmonary function testing needed to assess severity. Smoking cessation critical. Discussed potential maintenance inhaler for worsening dyspnea. - Order pulmonary function testing. - Continue albuterol as needed. He wants to defer maintenance inahler at this time.  Influenza A (Post-Hospitalization) Hospitalized with influenza A requiring mechanical  ventilation. Discharged with supplemental oxygen, now used occasionally. Exertional dyspnea persists. - Evaluate oxygen therapy need by assessing exertional oxygen saturation. - Maintain supplemental oxygen for occasional use.  Smoking Cessation Smokes 1.5 packs/day with previous quit attempts. Smoking cessation crucial for respiratory health. - Discuss cessation strategies including behavioral modifications and pharmacotherapy. - Encourage nicotine replacement, Chantix, Wellbutrin. - Suggest behavioral strategies for cravings.   Smoking Cessation Counseling:  1. The patient is an everyday smoker and symptomatic due to the following condition emphysema 2. The patient is currently pre-contemplative in quitting smoking. 3. I advised patient to quit smoking. 4. We identified patient specific barriers  to change.  5. I personally spent 3  minutes counseling the patient regarding tobacco use disorder. 6. We discussed management of stress and anxiety to help with smoking cessation, when applicable. 7. We discussed nicotine replacement therapy, Wellbutrin, Chantix as possible options. 8. I advised setting a quit date. 9. Follow?up arranged with our office to continue ongoing discussions. 10.Resources given to patient including quit hotline.      I spent 30 minutes on 11/08/2023 in care of this patient including face to face time and non-face to face time spent charting, review of outside records, and coordination of care.   Return to Care: No follow-ups on file.   Louie Rover, MD Pulmonary and Critical Care Medicine [facility] Office:[phone]

## 2023-11-08 NOTE — Patient Instructions (Signed)
 It was a pleasure to see you today!  Please schedule follow up with myself in 4 months.  If my schedule is not open yet, we will contact you with a reminder closer to that time. Please call [phone] if you haven't heard from [place] a month before, and always call us sooner if issues or concerns arise. You can also send [place] a message through MyChart, but but aware that this is not to be used for urgent issues and it may take up to 5-7 days to receive a reply. Please be aware that you will likely be able to view your results before I have a chance to respond to them. Please give [place] 5 business days to respond to any non-urgent results.    Before your next visit I would like you to have: Full set of PFTs - schedule at front desk   -EMPHYSEMA: Emphysema is a lung condition that causes shortness of breath due to damage to the air sacs in the lungs. We will conduct pulmonary function testing to assess the severity of your condition. Continue using your albuterol inhaler as needed. It's crucial to quit smoking, and we discussed options like nicotine replacement, Chantix, and Wellbutrin. We also talked about behavioral strategies to manage cravings. Additionally, you will be referred for a lung cancer screening with a low-dose CT scan. We can walk you around at next visit to discontinue home oxygen. Let me know if you want to try a maintenance inhaler for your breathing taken once daily. For now you can continue albuterol inhaler as needed.   What are the benefits of quitting smoking? Quitting smoking can lower your chances of getting or dying from heart disease, lung disease, kidney failure, infection, or cancer. It can also lower your chances of getting osteoporosis, a condition that makes your bones weak. Plus, quitting smoking can help your skin look younger and reduce the chances that you will have problems with sex.  Quitting smoking will improve your health no matter how old you are, and no matter how  long or how much you have smoked.  What should I do if I want to quit smoking? The letters in the word "START" can help you remember the steps to take: S = Set a quit date. T = Tell family, friends, and the people around you that you plan to quit. A = Anticipate or plan ahead for the tough times you'll face while quitting. R = Remove cigarettes and other tobacco products from your home, car, and work. T = Talk to your doctor about getting help to quit.  How can my doctor or nurse help? Your doctor or nurse can give you advice on the best way to quit. He or she can also put you in touch with counselors or other people you can call for support. Plus, your doctor or nurse can give you medicines to: ?Reduce your craving for cigarettes ?Reduce the unpleasant symptoms that happen when you stop smoking (called "withdrawal symptoms"). You can also get help from a free phone line (1-800-QUIT-NOW) or go online to [employer].  What are the symptoms of withdrawal? The symptoms include: ?Trouble sleeping ?Being irritable, anxious or restless ?Getting frustrated or angry ?Having trouble thinking clearly  Some people who stop smoking become temporarily depressed. Some people need treatment for depression, such as counseling or antidepressant medicines. Depressed people might: ?No longer enjoy or care about doing the things they used to like to do ?Feel sad, down, hopeless, nervous, or  cranky most of the day, almost every day ?Lose or gain weight ?Sleep too much or too little ?Feel tired or like they have no energy ?Feel guilty or like they are worth nothing ?Forget things or feel confused ?Move and speak more slowly than usual ?Act restless or have trouble staying still ?Think about death or suicide  If you think you might be depressed, see your doctor or nurse. Only someone trained in mental health can tell for sure if you are depressed. If you ever feel like you might hurt yourself, go  straight to the nearest emergency department. Or you can call for an ambulance (in the [place] and [place], dial 9-1-1) or call your doctor or nurse right away and tell them it is an emergency. You can also reach the [place] National Suicide Prevention Lifeline at (234)149-9084 or http://hill.com/.  How do medicines help you stop smoking? Different medicines work in different ways: ?Nicotine replacement therapy eases withdrawal and reduces your body's craving for nicotine, the main drug found in cigarettes. There are different forms of nicotine replacement, including skin patches, lozenges, gum, nasal sprays, and "puffers" or inhalers. Many can be bought without a prescription, while others might require one. ?Bupropion is a prescription medicine that reduces your desire to smoke. This medicine is sold under the brand names Zyban and Wellbutrin. It is also available in a generic version, which is cheaper than brand name medicines. ?Varenicline (brand names: Chantix, Champix) is a prescription medicine that reduces withdrawal symptoms and cigarette cravings. If you think you'd like to take varenicline and you have a history of depression, anxiety, or heart disease, discuss this with your doctor or nurse before taking the medicine. Varenicline can also increase the effects of alcohol in some people. It's a good idea to limit drinking while you're taking it, at least until you know how it affects you.  How does counseling work? Counseling can happen during formal office visits or just over the phone. A counselor can help you: ?Figure out what triggers your smoking and what to do instead ?Overcome cravings ?Figure out what went wrong when you tried to quit before  What works best? Studies show that people have the best luck at quitting if they take medicines to help them quit and work with a Veterinary surgeon. It might also be helpful to combine nicotine replacement with one of the prescription medicines  that help people quit. In some cases, it might even make sense to take bupropion and varenicline together.  What about e-cigarettes? Sometimes people wonder if using electronic cigarettes, or "e-cigarettes," might help them quit smoking. Using e-cigarettes is also called "vaping." Doctors do not recommend e-cigarettes in place of medicines and counseling. That's because e-cigarettes still contain nicotine as well as other substances that might be harmful. It's not clear how they can affect a person's health in the long term.  Will I gain weight if I quit? Yes, you might gain a few pounds. But quitting smoking will have a much more positive effect on your health than weighing a few pounds more. Plus, you can help prevent some weight gain by being more active and eating less. Taking the medicine bupropion might help control weight gain.   What else can I do to improve my chances of quitting? You can: ?Start exercising. ?Stay away from smokers and places that you associate with smoking. If people close to you smoke, ask them to quit with you. ?Keep gum, hard candy, or something to put in your  mouth handy. If you get a craving for a cigarette, try one of these instead. ?Don't give up, even if you start smoking again. It takes most people a few tries before they succeed.  What if I am pregnant and I smoke? If you are pregnant, it's really important for the health of your baby that you quit. Ask your doctor what options you have, and what is safest for your baby  Understanding COPD   What is COPD? COPD stands for chronic obstructive pulmonary (lung) disease. COPD is a general term used for several lung diseases.  COPD is an umbrella term and encompasses other  common diseases in this group like chronic bronchitis and emphysema. Chronic asthma may also be included in this group. While some patients with COPD have only chronic bronchitis or emphysema, most patients have a combination of both.  You might  hear these terms used in exchange for one another.   COPD adds to the work of the heart. Diseased lungs may reduce the amount of oxygen that goes to the blood. High blood pressure in blood vessels from the heart to the lungs makes it difficult for the heart to pump. Lung disease can also cause the body to produce too many red blood cells which may make the blood thicker and harder to pump.   Patients who have COPD with low oxygen levels may develop an enlarged heart (cor pulmonale). This condition weakens the heart and causes increased shortness of breath and swelling in the legs and feet.   Chronic bronchitis Chronic bronchitis is irritation and inflammation (swelling) of the lining in the bronchial tubes (air passages). The irritation causes coughing and an excess amount of mucus in the airways. The swelling makes it difficult to get air in and out of the lungs. The small, hair-like structures on the inside of the airways (called cilia) may be damaged by the irritation. The cilia are then unable to help clean mucus from the airways.  Bronchitis is generally considered to be chronic when you have: a productive cough (cough up mucus) and shortness of breath that lasts about 3 months or more each year for 2 or more years in a row. Your doctor may define chronic bronchitis differently.   Emphysema Emphysema is the destruction, or breakdown, of the walls of the alveoli (air sacs) located at the end of the bronchial tubes. The damaged alveoli are not able to exchange oxygen and carbon dioxide between the lungs and the blood. The bronchioles lose their elasticity and collapse when you exhale, trapping air in the lungs. The trapped air keeps fresh air and oxygen from entering the lungs.   Who is affected by COPD? Emphysema and chronic bronchitis affect approximately 16 million people in the [place] , or close to 11 percent of the population.   Symptoms of COPD  Shortness of breath  Shortness of  breath with mild exercise (walking, using the stairs, etc.)  Chronic, productive cough (with mucus)  A feeling of "tightness" in the chest  Wheezing   What causes COPD? The two primary causes of COPD are cigarette smoking and alpha1-antitrypsin (AAT) deficiency. Air pollution and occupational dusts may also contribute to COPD, especially when the person exposed to these substances is a cigarette smoker.  Cigarette smoke causes COPD by irritating the airways and creating inflammation that narrows the airways, making it more difficult to breathe. Cigarette smoke also causes the cilia to stop working properly so mucus and trapped particles are not cleaned from  the airways. As a result, chronic cough and excess mucus production develop, leading to chronic bronchitis.  In some people, chronic bronchitis and infections can lead to destruction of the small airways, or emphysema.  AAT deficiency, an inherited disorder, can also lead to emphysema. Alpha antitrypsin (AAT) is a protective material produced in the liver and transported to the lungs to help combat inflammation. When there is not enough of the chemical AAT, the body is no longer protected from an enzyme in the white blood cells.   How is COPD diagnosed?  To diagnose COPD, the physician needs to know: Do you smoke?  Have you had chronic exposure to dust or air pollutants?  Do other members of your family have lung disease?  Are you short of breath?  Do you get short of breath with exercise?  Do you have chronic cough and/or wheezing?  Do you cough up excess mucus?  To help with the diagnosis, the physician will conduct a thorough physical exam which includes:  Listening to your lungs and heart  Checking your blood pressure and pulse  Examining your nose and throat  Checking your feet and ankles for swelling   Laboratory and other tests Several laboratory and other tests are needed to confirm a diagnosis of COPD. These tests may include:   Chest X-ray to look for lung changes that could be caused by COPD   Spirometry and pulmonary function tests (PFTs) to determine lung volume and air flow  Pulse oximetry to measure the saturation of oxygen in the blood  Arterial blood gases (ABGs) to determine the amount of oxygen and carbon dioxide in the blood  Exercise testing to determine if the oxygen level in the blood drops during exercise   Treatment In the beginning stages of COPD, there is minimal shortness of breath that may be noticed only during exercise. As the disease progresses, shortness of breath may worsen and you may need to wear an oxygen device.   To help control other symptoms of COPD, the following treatments and lifestyle changes may be prescribed.  Quitting smoking  Avoiding cigarette smoke and other irritants  Taking medications including: a. bronchodilators b. anti-inflammatory agents c. oxygen d. antibiotics  Maintaining a healthy diet  Following a structured exercise program such as pulmonary rehabilitation Preventing respiratory infections  Controlling stress   If your COPD progresses, you may be eligible to be evaluated for lung volume reduction surgery or lung transplantation. You may also be eligible to participate in certain clinical trials (research studies). Ask your health care providers about studies being conducted in your hospital.   What is the outlook? Although COPD can not be cured, its symptoms can be treated and your quality of life can be improved. Your prognosis or outlook for the future will depend on how well your lungs are functioning, your symptoms, and how well you respond to and follow your treatment plan.

## 2023-11-28 ENCOUNTER — Ambulatory Visit (HOSPITAL_BASED_OUTPATIENT_CLINIC_OR_DEPARTMENT_OTHER): Admitting: Internal Medicine

## 2023-11-28 DIAGNOSIS — F17200 Nicotine dependence, unspecified, uncomplicated: Secondary | ICD-10-CM

## 2023-11-28 DIAGNOSIS — J432 Centrilobular emphysema: Secondary | ICD-10-CM | POA: Diagnosis not present

## 2023-11-28 LAB — PULMONARY FUNCTION TEST
DL/VA % pred: 69 %
DL/VA: 2.89 ml/min/mmHg/L
DLCO unc % pred: 53 %
DLCO unc: 13.69 ml/min/mmHg
FEF 25-75 Post: 0.74 L/s
FEF 25-75 Pre: 0.54 L/s
FEF2575-%Change-Post: 36 %
FEF2575-%Pred-Post: 28 %
FEF2575-%Pred-Pre: 20 %
FEV1-%Change-Post: 8 %
FEV1-%Pred-Post: 43 %
FEV1-%Pred-Pre: 40 %
FEV1-Post: 1.43 L
FEV1-Pre: 1.32 L
FEV1FVC-%Change-Post: 0 %
FEV1FVC-%Pred-Pre: 68 %
FEV6-%Change-Post: 9 %
FEV6-%Pred-Post: 64 %
FEV6-%Pred-Pre: 58 %
FEV6-Post: 2.68 L
FEV6-Pre: 2.44 L
FEV6FVC-%Change-Post: 2 %
FEV6FVC-%Pred-Post: 101 %
FEV6FVC-%Pred-Pre: 99 %
FVC-%Change-Post: 7 %
FVC-%Pred-Post: 63 %
FVC-%Pred-Pre: 58 %
FVC-Post: 2.79 L
FVC-Pre: 2.59 L
Post FEV1/FVC ratio: 51 %
Post FEV6/FVC ratio: 96 %
Pre FEV1/FVC ratio: 51 %
Pre FEV6/FVC Ratio: 94 %
RV % pred: 203 %
RV: 4.7 L
TLC % pred: 109 %
TLC: 7.48 L

## 2023-11-28 NOTE — Patient Instructions (Signed)
 Full pft performed today.

## 2023-11-28 NOTE — Progress Notes (Signed)
 Full pft performed today.

## 2023-12-10 DIAGNOSIS — J9690 Respiratory failure, unspecified, unspecified whether with hypoxia or hypercapnia: Secondary | ICD-10-CM | POA: Diagnosis not present

## 2023-12-10 DIAGNOSIS — J441 Chronic obstructive pulmonary disease with (acute) exacerbation: Secondary | ICD-10-CM | POA: Diagnosis not present

## 2023-12-27 DIAGNOSIS — Z85828 Personal history of other malignant neoplasm of skin: Secondary | ICD-10-CM | POA: Diagnosis not present

## 2023-12-27 DIAGNOSIS — L720 Epidermal cyst: Secondary | ICD-10-CM | POA: Diagnosis not present

## 2024-01-10 DIAGNOSIS — J441 Chronic obstructive pulmonary disease with (acute) exacerbation: Secondary | ICD-10-CM | POA: Diagnosis not present

## 2024-01-10 DIAGNOSIS — J9690 Respiratory failure, unspecified, unspecified whether with hypoxia or hypercapnia: Secondary | ICD-10-CM | POA: Diagnosis not present

## 2024-02-09 DIAGNOSIS — J441 Chronic obstructive pulmonary disease with (acute) exacerbation: Secondary | ICD-10-CM | POA: Diagnosis not present

## 2024-02-09 DIAGNOSIS — J9690 Respiratory failure, unspecified, unspecified whether with hypoxia or hypercapnia: Secondary | ICD-10-CM | POA: Diagnosis not present

## 2024-02-28 ENCOUNTER — Encounter: Payer: Self-pay | Admitting: Internal Medicine

## 2024-02-28 ENCOUNTER — Ambulatory Visit: Admitting: Internal Medicine

## 2024-02-28 VITALS — BP 102/72 | HR 83 | Temp 97.9°F | Ht 69.0 in | Wt 135.0 lb

## 2024-02-28 DIAGNOSIS — F17210 Nicotine dependence, cigarettes, uncomplicated: Secondary | ICD-10-CM | POA: Diagnosis not present

## 2024-02-28 DIAGNOSIS — J9611 Chronic respiratory failure with hypoxia: Secondary | ICD-10-CM

## 2024-02-28 DIAGNOSIS — J432 Centrilobular emphysema: Secondary | ICD-10-CM

## 2024-02-28 NOTE — Patient Instructions (Addendum)
 It was a pleasure to see you today!  Please schedule follow up with myself in 6 months.  If my schedule is not open yet, we will contact you with a reminder closer to that time. Please call [phone] if you haven't heard from us  a month before, and always call us  sooner if issues or concerns arise. You can also send us  a message through MyChart, but but aware that this is not to be used for urgent issues and it may take up to 5-7 days to receive a reply. Please be aware that you will likely be able to view your results before I have a chance to respond to them. Please give us  5 business days to respond to any non-urgent results.    Before your next visit I would like you to have: Overnight oximetry test on room air  Your breathing testing is consistent with severe COPD.   Recommend resuming symbicort  2 puffs twice a day.  Let me know if you want to go on symbicort  and if you want me to send a prescription.    What are the benefits of quitting smoking? Quitting smoking can lower your chances of getting or dying from heart disease, lung disease, kidney failure, infection, or cancer. It can also lower your chances of getting osteoporosis, a condition that makes your bones weak. Plus, quitting smoking can help your skin look younger and reduce the chances that you will have problems with sex.  Quitting smoking will improve your health no matter how old you are, and no matter how long or how much you have smoked.  What should I do if I want to quit smoking? The letters in the word START can help you remember the steps to take: S = Set a quit date. T = Tell family, friends, and the people around you that you plan to quit. A = Anticipate or plan ahead for the tough times you'll face while quitting. R = Remove cigarettes and other tobacco products from your home, car, and work. T = Talk to your doctor about getting help to quit.  How can my doctor or nurse help? Your doctor or nurse can  give you advice on the best way to quit. He or she can also put you in touch with counselors or other people you can call for support. Plus, your doctor or nurse can give you medicines to: ?Reduce your craving for cigarettes ?Reduce the unpleasant symptoms that happen when you stop smoking (called withdrawal symptoms). You can also get help from a free phone line (1-800-QUIT-NOW) or go online to [employer].  What are the symptoms of withdrawal? The symptoms include: ?Trouble sleeping ?Being irritable, anxious or restless ?Getting frustrated or angry ?Having trouble thinking clearly  Some people who stop smoking become temporarily depressed. Some people need treatment for depression, such as counseling or antidepressant medicines. Depressed people might: ?No longer enjoy or care about doing the things they used to like to do ?Feel sad, down, hopeless, nervous, or cranky most of the day, almost every day ?Lose or gain weight ?Sleep too much or too little ?Feel tired or like they have no energy ?Feel guilty or like they are worth nothing ?Forget things or feel confused ?Move and speak more slowly than usual ?Act restless or have trouble staying still ?Think about death or suicide  If you think you might be depressed, see your doctor or nurse. Only someone trained in mental health can tell for sure if you  are depressed. If you ever feel like you might hurt yourself, go straight to the nearest emergency department. Or you can call for an ambulance (in the [place]  and [place], dial 9-1-1) or call your doctor or nurse right away and tell them it is an emergency. You can also reach the [place]  National Suicide Prevention Lifeline at (386)764-5705 or http://hill.com/.  How do medicines help you stop smoking? Different medicines work in different ways: ?Nicotine  replacement therapy eases withdrawal and reduces your body's craving for nicotine , the main drug found in cigarettes.  There are different forms of nicotine  replacement, including skin patches, lozenges, gum, nasal sprays, and puffers or inhalers. Many can be bought without a prescription, while others might require one. ?Bupropion is a prescription medicine that reduces your desire to smoke. This medicine is sold under the brand names Zyban and Wellbutrin. It is also available in a generic version, which is cheaper than brand name medicines. ?Varenicline (brand names: Chantix, Champix) is a prescription medicine that reduces withdrawal symptoms and cigarette cravings. If you think you'd like to take varenicline and you have a history of depression, anxiety, or heart disease, discuss this with your doctor or nurse before taking the medicine. Varenicline can also increase the effects of alcohol  in some people. It's a good idea to limit drinking while you're taking it, at least until you know how it affects you.  How does counseling work? Counseling can happen during formal office visits or just over the phone. A counselor can help you: ?Figure out what triggers your smoking and what to do instead ?Overcome cravings ?Figure out what went wrong when you tried to quit before  What works best? Studies show that people have the best luck at quitting if they take medicines to help them quit and work with a Veterinary surgeon. It might also be helpful to combine nicotine  replacement with one of the prescription medicines that help people quit. In some cases, it might even make sense to take bupropion and varenicline together.  What about e-cigarettes? Sometimes people wonder if using electronic cigarettes, or e-cigarettes, might help them quit smoking. Using e-cigarettes is also called vaping. Doctors do not recommend e-cigarettes in place of medicines and counseling. That's because e-cigarettes still contain nicotine  as well as other substances that might be harmful. It's not clear how they can affect a person's health in the  long term.  Will I gain weight if I quit? Yes, you might gain a few pounds. But quitting smoking will have a much more positive effect on your health than weighing a few pounds more. Plus, you can help prevent some weight gain by being more active and eating less. Taking the medicine bupropion might help control weight gain.   What else can I do to improve my chances of quitting? You can: ?Start exercising. ?Stay away from smokers and places that you associate with smoking. If people close to you smoke, ask them to quit with you. ?Keep gum, hard candy, or something to put in your mouth handy. If you get a craving for a cigarette, try one of these instead. ?Don't give up, even if you start smoking again. It takes most people a few tries before they succeed.  What if I am pregnant and I smoke? If you are pregnant, it's really important for the health of your baby that you quit. Ask your doctor what options you have, and what is safest for your baby

## 2024-02-28 NOTE — Progress Notes (Signed)
 Jackson Mathews    [id number]    1955-11-12  Primary Care Physician:Hilts, Ozell, MD Date of Appointment: 02/28/2024 Established Patient Visit  Chief complaint:   Chief Complaint  Patient presents with   COPD    Doing well, follow-up for Pft     HPI: Jackson Mathews is a 68 y.o. man with severe emphysema and ongoing tobacco use disorder.   Interval Updates: Here for follow up after pfts.  Feels the albuterol  makes him cough more so he is barely taking it.  Now interested in taking symbicort  as a maintenance inhaler. Taking this about twice month.   Oxygen  levels are good at home. Over 91-95% first thing in the morning. He wants oxygen  picked up. He says his HR gets in the high 100s.   I have reviewed the patient's family social and past medical history and updated as appropriate.   Past Medical History:  Diagnosis Date   CAD in native artery-cath 09/24/15 non obstructive 09/24/2015   Calf pain    Cancer (HCC)    Skin   Carotid artery occlusion    Chest pain    COPD (chronic obstructive pulmonary disease) (HCC)    Dyspnea    Hearing loss    Heart murmur    History of kidney stones    History of right-sided carotid endarterectomy 09/23/2015   Hx of colonic polyp - ssp 06/21/2015   Hypercholesteremia    Hypertension    Hypoxia 10/12/2018   Lower back pain    Palpitations    Tinnitus    Tobacco use 09/23/2015   Vertigo     Past Surgical History:  Procedure Laterality Date   CARDIAC CATHETERIZATION N/A 09/24/2015   Procedure: Left Heart Cath and Coronary Angiography;  Surgeon: Lonni JONETTA Cash, MD;  Location: [facility] INVASIVE CV LAB;  Service: Cardiovascular;  Laterality: N/A;   CAROTID ENDARTERECTOMY  05/05/11   Right CEA   MELANOMA EXCISION     l eye   SKIN CANCER EXCISION     TRANSCAROTID ARTERY REVASCULARIZATION  Right 06/28/2021   Procedure: RIGHT TRANSCAROTID ARTERY REVASCULARIZATION;  Surgeon: Lanis Fonda BRAVO, MD;  Location: [facility] OR;   Service: Vascular;  Laterality: Right;    Family History  Problem Relation Age of Onset   Heart disease Mother    Cancer Father    Heart attack Father        s/p CABG   Emphysema Father    Diabetes Brother    Emphysema Paternal Grandmother    Colon cancer Neg Hx    Esophageal cancer Neg Hx    Ulcerative colitis Neg Hx    Stomach cancer Neg Hx    Rectal cancer Neg Hx     Social History   Occupational History   Not on file  Tobacco Use   Smoking status: Every Day    Current packs/day: 2.00    Average packs/day: 2.0 packs/day for 53.6 years (107.2 ttl pk-yrs)    Types: Cigarettes    Start date: 51   Smokeless tobacco: Never   Tobacco comments:    Smokes 2 packs of cigarettes  a day. [facility] 02/28/2024  Vaping Use   Vaping status: Never Used  Substance and Sexual Activity   Alcohol  use: No    Alcohol /week: 0.0 standard drinks of alcohol    Drug use: No   Sexual activity: Not on file     Physical Exam: Blood pressure 102/72, pulse 83, temperature  97.9 F (36.6 C), temperature source Oral, height 5' 9 (1.753 m), weight 135 lb (61.2 kg), SpO2 95%.  Gen:      No acute distress ENT:  no nasal polyps, mucus membranes moist Lungs:    No increased respiratory effort, symmetric chest wall excursion, clear to auscultation bilaterally, no wheezes or crackles CV:         Regular rate and rhythm; no murmurs, rubs, or gallops.  No pedal edema   Data Reviewed: Imaging: I have personally reviewed the chest xray shows hyperinflation  PFTs:     Latest Ref Rng & Units 11/28/2023    1:22 PM  PFT Results  FVC-Pre L 2.59   FVC-Predicted Pre % 58   FVC-Post L 2.79   FVC-Predicted Post % 63   Pre FEV1/FVC % % 51   Post FEV1/FCV % % 51   FEV1-Pre L 1.32   FEV1-Predicted Pre % 40   FEV1-Post L 1.43   DLCO uncorrected ml/min/mmHg 13.69   DLCO UNC% % 53   DLVA Predicted % 69   TLC L 7.48   TLC % Predicted % 109   RV % Predicted % 203    I have personally reviewed the  patient's PFTs and severe emphysema  Labs: Lab Results  Component Value Date   WBC 8.0 09/11/2023   HGB 14.3 09/11/2023   HCT 44.0 09/11/2023   MCV 89.2 09/11/2023   PLT 320 09/11/2023   Lab Results  Component Value Date   NA 139 09/12/2023   K 4.3 09/12/2023   CO2 33 (H) 09/12/2023   GLUCOSE 118 (H) 09/12/2023   BUN 16 09/12/2023   CREATININE 0.66 09/12/2023   CALCIUM  8.4 (L) 09/12/2023   GFRNONAA >60 09/12/2023    Immunization status:  There is no immunization history on file for this patient.  External Records Personally Reviewed: vascular surgery hospital stay  Assessment:  Centrilobular emphysema FEV1 40% Ongoing tobacco use disorder Tobacco use disorder. Pre contemplative  Plan/Recommendations:  Your breathing testing is consistent with severe COPD.   Recommend resuming symbicort  2 puffs twice a day.  Let me know if you want to go on symbicort  and if you want me to send a prescription.   Smoking Cessation Counseling:  1. The patient is an everyday smoker and symptomatic due to the following condition copd 2. The patient is currently pre-contemplative in quitting smoking. 3. I advised patient to quit smoking. 4. We identified patient specific barriers to change.  5. I personally spent 3 minutes counseling the patient regarding tobacco use disorder. 6. We discussed management of stress and anxiety to help with smoking cessation, when applicable. 7. We discussed nicotine  replacement therapy, Wellbutrin, Chantix as possible options. 8. I advised setting a quit date. 9. Follow?up arranged with our office to continue ongoing discussions. 10.Resources given to patient including quit hotline.    Return to Care: Return in about 6 months (around 08/30/2024).   Verdon Gore, MD Pulmonary and Critical Care Medicine [facility] Office:[phone]

## 2024-03-01 ENCOUNTER — Other Ambulatory Visit: Payer: Self-pay | Admitting: Internal Medicine

## 2024-03-01 NOTE — Telephone Encounter (Signed)
 Copied from CRM [id number]. Topic: Clinical - Medication Refill >> Mar 01, 2024  3:33 PM Rilla B wrote: Medication: budesonide -formoterol  (SYMBICORT ) 160-4.5 MCG/ACT inhaler  Has the patient contacted their pharmacy? Yes (Agent: If no, request that the patient contact the pharmacy for the refill. If patient does not wish to contact the pharmacy document the reason why and proceed with request.) (Agent: If yes, when and what did the pharmacy advise?)  This is the patient's preferred pharmacy:  [id number] [place], [place] - [address] AT [facility] OF [facility] [address] [address] [facility] [address] & [facility] [address] [place] [address] Phone: [phone] Fax: [phone]  Is this the correct pharmacy for this prescription? Yes If no, delete pharmacy and type the correct one.   Has the prescription been filled recently? Yes  Is the patient out of the medication? No  Has the patient been seen for an appointment in the last year OR does the patient have an upcoming appointment? Yes  Can we respond through MyChart? Yes  Agent: Please be advised that Rx refills may take up to 3 business days. We ask that you follow-up with your pharmacy.

## 2024-03-06 ENCOUNTER — Ambulatory Visit: Payer: Self-pay

## 2024-03-06 ENCOUNTER — Other Ambulatory Visit: Payer: Self-pay

## 2024-03-06 DIAGNOSIS — J441 Chronic obstructive pulmonary disease with (acute) exacerbation: Secondary | ICD-10-CM

## 2024-03-06 MED ORDER — BUDESONIDE-FORMOTEROL FUMARATE 160-4.5 MCG/ACT IN AERO: 2.0000 | INHALATION_SPRAY | Freq: Two times a day (BID) | RESPIRATORY_TRACT | 3 refills | Status: DC

## 2024-03-06 NOTE — Telephone Encounter (Signed)
    FYI Only or Action Required?: Action required by provider: Patient requested Symbicort  refill as per discussed at last visit 02/28/2024 with Verdon Gore, MD.  Patient is followed in Pulmonology for Centrilobular Emphysema, last seen on 02/28/2024 by Gore Verdon RAMAN, MD.  Called Nurse Triage reporting Information Only.    Triage Disposition: Call PCP When Office is Open  Patient/caregiver understands and will follow disposition?: Yes                      Answer Assessment - Initial Assessment Questions Patient was checking on the status of the prescription refill request sent to Pulmonary on 03/01/2024.  Patient saw Verdon Gore on 02/28/2024 and per Desai's notes from that visit date:  Recommend resuming symbicort  2 puffs twice a day.  Let me know if you want to go on symbicort  and if you want me to send a prescription. --Verdon Gore, MD Pulmonary and Critical Care Medicine [facility] Office:[phone]  Patient did want this prescription refilled.  Protocols used: Information Only Call - No Triage-A-AH

## 2024-03-06 NOTE — Telephone Encounter (Signed)
 Refill has been sent into patient's pharmacy .Patient has been notified .Nothing else further needed.

## 2024-03-11 DIAGNOSIS — J9690 Respiratory failure, unspecified, unspecified whether with hypoxia or hypercapnia: Secondary | ICD-10-CM | POA: Diagnosis not present

## 2024-03-21 DIAGNOSIS — R0902 Hypoxemia: Secondary | ICD-10-CM | POA: Diagnosis not present

## 2024-03-21 DIAGNOSIS — G4730 Sleep apnea, unspecified: Secondary | ICD-10-CM | POA: Diagnosis not present

## 2024-03-27 ENCOUNTER — Encounter: Payer: Self-pay | Admitting: Internal Medicine

## 2024-04-11 DIAGNOSIS — J9690 Respiratory failure, unspecified, unspecified whether with hypoxia or hypercapnia: Secondary | ICD-10-CM | POA: Diagnosis not present

## 2024-05-03 ENCOUNTER — Telehealth: Payer: Self-pay

## 2024-05-03 NOTE — Telephone Encounter (Signed)
 Copied from CRM [id number]. Topic: General - Other >> May 01, 2024 12:55 PM Ismael A wrote: Reason for CRM:  patient calling regarding Dr. Meade departure would like to personally speak with Dr. Meade to express his gratitude if possible  Patient also stated he has an oxygen  concentrator sitting around in his home that he no longer uses it and would like to get rid of it

## 2024-05-11 DIAGNOSIS — J441 Chronic obstructive pulmonary disease with (acute) exacerbation: Secondary | ICD-10-CM | POA: Diagnosis not present

## 2024-05-11 DIAGNOSIS — J9690 Respiratory failure, unspecified, unspecified whether with hypoxia or hypercapnia: Secondary | ICD-10-CM | POA: Diagnosis not present

## 2024-05-29 NOTE — Telephone Encounter (Signed)
 Thank you for the kind words.   He mentioned the oxygen  pick up to me at his last appointment but I was concerned he might be dropping on exertion. Please set him up to see one of the new docs or APP at next visit in January. We can do a desaturation study at that time and discontinue the oxygen  if he is ok on room air.

## 2024-05-29 NOTE — Telephone Encounter (Signed)
 I called and spoke with patient, provided information/recommendations per Dr. Meade.  I scheduled him to see Dr. Kara on 08/05/24 at 10 am, advised to arrive by 9:45 am for check in.  He  verbalized understanding.  Nothing further needed.

## 2024-06-06 ENCOUNTER — Telehealth: Payer: Self-pay

## 2024-06-06 NOTE — Telephone Encounter (Signed)
 Patient stated he transferred back to provider once he moved back to town. Dr. Worth Kitty is no longer his PCP.

## 2024-07-01 ENCOUNTER — Inpatient Hospital Stay (HOSPITAL_BASED_OUTPATIENT_CLINIC_OR_DEPARTMENT_OTHER)
Admission: EM | Admit: 2024-07-01 | Discharge: 2024-07-03 | Disposition: A | Attending: Internal Medicine | Admitting: Internal Medicine

## 2024-07-01 ENCOUNTER — Emergency Department (HOSPITAL_BASED_OUTPATIENT_CLINIC_OR_DEPARTMENT_OTHER)

## 2024-07-01 ENCOUNTER — Other Ambulatory Visit: Payer: Self-pay

## 2024-07-01 ENCOUNTER — Encounter (HOSPITAL_BASED_OUTPATIENT_CLINIC_OR_DEPARTMENT_OTHER): Payer: Self-pay

## 2024-07-01 DIAGNOSIS — E440 Moderate protein-calorie malnutrition: Secondary | ICD-10-CM | POA: Diagnosis present

## 2024-07-01 DIAGNOSIS — Z8249 Family history of ischemic heart disease and other diseases of the circulatory system: Secondary | ICD-10-CM | POA: Diagnosis not present

## 2024-07-01 DIAGNOSIS — R0789 Other chest pain: Secondary | ICD-10-CM

## 2024-07-01 DIAGNOSIS — Z860100 Personal history of colon polyps, unspecified: Secondary | ICD-10-CM | POA: Diagnosis not present

## 2024-07-01 DIAGNOSIS — E7800 Pure hypercholesterolemia, unspecified: Secondary | ICD-10-CM | POA: Diagnosis present

## 2024-07-01 DIAGNOSIS — J9601 Acute respiratory failure with hypoxia: Principal | ICD-10-CM | POA: Diagnosis present

## 2024-07-01 DIAGNOSIS — J441 Chronic obstructive pulmonary disease with (acute) exacerbation: Secondary | ICD-10-CM | POA: Diagnosis present

## 2024-07-01 DIAGNOSIS — Z720 Tobacco use: Secondary | ICD-10-CM | POA: Diagnosis present

## 2024-07-01 DIAGNOSIS — Z6820 Body mass index (BMI) 20.0-20.9, adult: Secondary | ICD-10-CM | POA: Diagnosis not present

## 2024-07-01 DIAGNOSIS — R059 Cough, unspecified: Secondary | ICD-10-CM | POA: Insufficient documentation

## 2024-07-01 DIAGNOSIS — H9190 Unspecified hearing loss, unspecified ear: Secondary | ICD-10-CM | POA: Diagnosis present

## 2024-07-01 DIAGNOSIS — Z85828 Personal history of other malignant neoplasm of skin: Secondary | ICD-10-CM | POA: Diagnosis not present

## 2024-07-01 DIAGNOSIS — Z7951 Long term (current) use of inhaled steroids: Secondary | ICD-10-CM | POA: Diagnosis not present

## 2024-07-01 DIAGNOSIS — J101 Influenza due to other identified influenza virus with other respiratory manifestations: Secondary | ICD-10-CM | POA: Diagnosis present

## 2024-07-01 DIAGNOSIS — I7410 Embolism and thrombosis of unspecified parts of aorta: Secondary | ICD-10-CM | POA: Diagnosis present

## 2024-07-01 DIAGNOSIS — E785 Hyperlipidemia, unspecified: Secondary | ICD-10-CM | POA: Diagnosis not present

## 2024-07-01 DIAGNOSIS — F17210 Nicotine dependence, cigarettes, uncomplicated: Secondary | ICD-10-CM | POA: Diagnosis present

## 2024-07-01 DIAGNOSIS — E871 Hypo-osmolality and hyponatremia: Secondary | ICD-10-CM | POA: Diagnosis present

## 2024-07-01 DIAGNOSIS — Z87442 Personal history of urinary calculi: Secondary | ICD-10-CM | POA: Diagnosis not present

## 2024-07-01 DIAGNOSIS — Z85820 Personal history of malignant melanoma of skin: Secondary | ICD-10-CM | POA: Diagnosis not present

## 2024-07-01 DIAGNOSIS — K219 Gastro-esophageal reflux disease without esophagitis: Secondary | ICD-10-CM | POA: Diagnosis present

## 2024-07-01 DIAGNOSIS — Z833 Family history of diabetes mellitus: Secondary | ICD-10-CM | POA: Diagnosis not present

## 2024-07-01 DIAGNOSIS — R079 Chest pain, unspecified: Secondary | ICD-10-CM | POA: Diagnosis present

## 2024-07-01 DIAGNOSIS — R748 Abnormal levels of other serum enzymes: Secondary | ICD-10-CM | POA: Diagnosis present

## 2024-07-01 DIAGNOSIS — Z7982 Long term (current) use of aspirin: Secondary | ICD-10-CM | POA: Diagnosis not present

## 2024-07-01 DIAGNOSIS — I2510 Atherosclerotic heart disease of native coronary artery without angina pectoris: Secondary | ICD-10-CM | POA: Diagnosis present

## 2024-07-01 DIAGNOSIS — I779 Disorder of arteries and arterioles, unspecified: Secondary | ICD-10-CM | POA: Diagnosis present

## 2024-07-01 DIAGNOSIS — I10 Essential (primary) hypertension: Secondary | ICD-10-CM | POA: Diagnosis present

## 2024-07-01 DIAGNOSIS — Z79899 Other long term (current) drug therapy: Secondary | ICD-10-CM | POA: Diagnosis not present

## 2024-07-01 DIAGNOSIS — Z885 Allergy status to narcotic agent status: Secondary | ICD-10-CM | POA: Diagnosis not present

## 2024-07-01 LAB — BASIC METABOLIC PANEL WITH GFR
Anion gap: 13 (ref 5–15)
BUN: 10 mg/dL (ref 8–23)
CO2: 23 mmol/L (ref 22–32)
Calcium: 9.1 mg/dL (ref 8.9–10.3)
Chloride: 92 mmol/L — ABNORMAL LOW (ref 98–111)
Creatinine, Ser: 0.81 mg/dL (ref 0.61–1.24)
GFR, Estimated: >60 mL/min (ref >60)
Glucose, Bld: 131 mg/dL — ABNORMAL HIGH (ref 70–99)
Potassium: 4.2 mmol/L (ref 3.5–5.1)
Sodium: 127 mmol/L — ABNORMAL LOW (ref 135–145)

## 2024-07-01 LAB — MRSA NEXT GEN BY PCR, NASAL: MRSA by PCR Next Gen: NOT DETECTED

## 2024-07-01 LAB — I-STAT VENOUS BLOOD GAS, ED
Acid-base deficit: 4 mmol/L — ABNORMAL HIGH (ref 0.0–2.0)
Bicarbonate: 21.3 mmol/L (ref 20.0–28.0)
Calcium, Ion: 1.05 mmol/L — ABNORMAL LOW (ref 1.15–1.40)
HCT: 41 % (ref 39.0–52.0)
Hemoglobin: 13.9 g/dL (ref 13.0–17.0)
O2 Saturation: 93 %
Potassium: 3.7 mmol/L (ref 3.5–5.1)
Sodium: 125 mmol/L — ABNORMAL LOW (ref 135–145)
TCO2: 22 mmol/L (ref 22–32)
pCO2, Ven: 36.8 mmHg — ABNORMAL LOW (ref 44–60)
pH, Ven: 7.37 (ref 7.25–7.43)
pO2, Ven: 67 mmHg — ABNORMAL HIGH (ref 32–45)

## 2024-07-01 LAB — RESP PANEL BY RT-PCR (RSV, FLU A&B, COVID)  RVPGX2
Influenza A by PCR: POSITIVE — AB
Influenza B by PCR: NEGATIVE
Resp Syncytial Virus by PCR: NEGATIVE
SARS Coronavirus 2 by RT PCR: NEGATIVE

## 2024-07-01 LAB — CBC WITH DIFFERENTIAL/PLATELET
Abs Immature Granulocytes: 0.03 K/uL (ref 0.00–0.07)
Basophils Absolute: 0 K/uL (ref 0.0–0.1)
Basophils Relative: 1 %
Eosinophils Absolute: 0 K/uL (ref 0.0–0.5)
Eosinophils Relative: 0 %
HCT: 43.7 % (ref 39.0–52.0)
Hemoglobin: 14.7 g/dL (ref 13.0–17.0)
Immature Granulocytes: 1 %
Lymphocytes Relative: 4 %
Lymphs Abs: 0.3 K/uL — ABNORMAL LOW (ref 0.7–4.0)
MCH: 29.9 pg (ref 26.0–34.0)
MCHC: 33.6 g/dL (ref 30.0–36.0)
MCV: 88.8 fL (ref 80.0–100.0)
Monocytes Absolute: 0.6 K/uL (ref 0.1–1.0)
Monocytes Relative: 9 %
Neutro Abs: 5.8 K/uL (ref 1.7–7.7)
Neutrophils Relative %: 85 %
Platelets: 166 K/uL (ref 150–400)
RBC: 4.92 MIL/uL (ref 4.22–5.81)
RDW: 13.2 % (ref 11.5–15.5)
WBC: 6.6 K/uL (ref 4.0–10.5)
nRBC: 0 % (ref 0.0–0.2)

## 2024-07-01 LAB — D-DIMER, QUANTITATIVE: D-Dimer, Quant: 1.02 ug{FEU}/mL — ABNORMAL HIGH (ref 0.00–0.50)

## 2024-07-01 LAB — TROPONIN T, HIGH SENSITIVITY
Troponin T High Sensitivity: <15 ng/L (ref 0–19)
Troponin T High Sensitivity: <15 ng/L (ref 0–19)

## 2024-07-01 LAB — OSMOLALITY: Osmolality: 283 mosm/kg (ref 275–295)

## 2024-07-01 LAB — PRO BRAIN NATRIURETIC PEPTIDE: Pro Brain Natriuretic Peptide: 188 pg/mL (ref <300.0)

## 2024-07-01 MED ORDER — ALBUTEROL SULFATE (2.5 MG/3ML) 0.083% IN NEBU: 2.5000 mg | INHALATION_SOLUTION | RESPIRATORY_TRACT | Status: DC | PRN

## 2024-07-01 MED ORDER — ALBUTEROL SULFATE (2.5 MG/3ML) 0.083% IN NEBU
5.0000 mg | INHALATION_SOLUTION | Freq: Once | RESPIRATORY_TRACT | Status: AC
  Administered 2024-07-01: 5 mg via RESPIRATORY_TRACT
  Filled 2024-07-01: qty 6

## 2024-07-01 MED ORDER — IPRATROPIUM-ALBUTEROL 0.5-2.5 (3) MG/3ML IN SOLN: 6.0000 mL | Freq: Once | RESPIRATORY_TRACT | Status: AC

## 2024-07-01 MED ORDER — HYDROCOD POLI-CHLORPHE POLI ER 10-8 MG/5ML PO SUER
5.0000 mL | Freq: Every evening | ORAL | Status: DC | PRN
  Administered 2024-07-01: 5 mL via ORAL
  Filled 2024-07-01: qty 5

## 2024-07-01 MED ORDER — SODIUM CHLORIDE 0.9 % IV SOLN
500.0000 mg | INTRAVENOUS | Status: AC
  Administered 2024-07-02: 500 mg via INTRAVENOUS
  Filled 2024-07-01 (×2): qty 5

## 2024-07-01 MED ORDER — MAGNESIUM SULFATE 2 GM/50ML IV SOLN
2.0000 g | Freq: Once | INTRAVENOUS | Status: AC
  Administered 2024-07-01: 2 g via INTRAVENOUS
  Filled 2024-07-01: qty 50

## 2024-07-01 MED ORDER — SODIUM CHLORIDE 0.9% FLUSH
3.0000 mL | Freq: Two times a day (BID) | INTRAVENOUS | Status: DC
  Administered 2024-07-02 – 2024-07-03 (×4): 3 mL via INTRAVENOUS

## 2024-07-01 MED ORDER — ACETAMINOPHEN 650 MG RE SUPP: 650.0000 mg | Freq: Four times a day (QID) | RECTAL | Status: DC | PRN

## 2024-07-01 MED ORDER — LIDOCAINE 5 % EX PTCH
1.0000 | MEDICATED_PATCH | CUTANEOUS | Status: DC
  Administered 2024-07-01 – 2024-07-02 (×2): 1 via TRANSDERMAL
  Filled 2024-07-01 (×2): qty 1

## 2024-07-01 MED ORDER — FLUTICASONE PROPIONATE 50 MCG/ACT NA SUSP: 2.0000 | Freq: Every day | NASAL | Status: DC | PRN

## 2024-07-01 MED ORDER — PREDNISONE 20 MG PO TABS
40.0000 mg | ORAL_TABLET | Freq: Every day | ORAL | Status: DC
  Administered 2024-07-03: 40 mg via ORAL
  Filled 2024-07-01 (×2): qty 2

## 2024-07-01 MED ORDER — ATORVASTATIN CALCIUM 40 MG PO TABS
40.0000 mg | ORAL_TABLET | Freq: Every day | ORAL | Status: DC
  Administered 2024-07-01: 40 mg via ORAL
  Filled 2024-07-01: qty 1

## 2024-07-01 MED ORDER — ENOXAPARIN SODIUM 40 MG/0.4ML IJ SOSY
40.0000 mg | PREFILLED_SYRINGE | INTRAMUSCULAR | Status: DC
  Administered 2024-07-01 – 2024-07-02 (×2): 40 mg via SUBCUTANEOUS
  Filled 2024-07-01 (×2): qty 0.4

## 2024-07-01 MED ORDER — PANTOPRAZOLE SODIUM 40 MG PO TBEC
40.0000 mg | DELAYED_RELEASE_TABLET | Freq: Every day | ORAL | Status: DC
  Administered 2024-07-02 – 2024-07-03 (×2): 40 mg via ORAL
  Filled 2024-07-01 (×2): qty 1

## 2024-07-01 MED ORDER — NICOTINE 21 MG/24HR TD PT24
21.0000 mg | MEDICATED_PATCH | Freq: Every day | TRANSDERMAL | Status: DC | PRN
  Administered 2024-07-02 – 2024-07-03 (×2): 21 mg via TRANSDERMAL
  Filled 2024-07-01 (×2): qty 1

## 2024-07-01 MED ORDER — SENNOSIDES-DOCUSATE SODIUM 8.6-50 MG PO TABS: 1.0000 | ORAL_TABLET | Freq: Every evening | ORAL | Status: DC | PRN

## 2024-07-01 MED ORDER — FLUTICASONE FUROATE-VILANTEROL 200-25 MCG/ACT IN AEPB
1.0000 | INHALATION_SPRAY | Freq: Every day | RESPIRATORY_TRACT | Status: DC
  Administered 2024-07-01 – 2024-07-03 (×2): 1 via RESPIRATORY_TRACT
  Filled 2024-07-01: qty 28

## 2024-07-01 MED ORDER — ASPIRIN 81 MG PO TBEC
81.0000 mg | DELAYED_RELEASE_TABLET | Freq: Every day | ORAL | Status: DC
  Administered 2024-07-02 – 2024-07-03 (×2): 81 mg via ORAL
  Filled 2024-07-01 (×2): qty 1

## 2024-07-01 MED ORDER — AZITHROMYCIN 250 MG PO TABS
500.0000 mg | ORAL_TABLET | Freq: Every day | ORAL | Status: DC
  Administered 2024-07-02 – 2024-07-03 (×2): 500 mg via ORAL
  Filled 2024-07-01 (×2): qty 2

## 2024-07-01 MED ORDER — IPRATROPIUM-ALBUTEROL 0.5-2.5 (3) MG/3ML IN SOLN
RESPIRATORY_TRACT | Status: AC
  Administered 2024-07-01: 6 mL via RESPIRATORY_TRACT
  Filled 2024-07-01: qty 6

## 2024-07-01 MED ORDER — ONDANSETRON HCL 4 MG PO TABS: 4.0000 mg | ORAL_TABLET | Freq: Four times a day (QID) | ORAL | Status: DC | PRN

## 2024-07-01 MED ORDER — METHYLPREDNISOLONE SODIUM SUCC 125 MG IJ SOLR
40.0000 mg | Freq: Two times a day (BID) | INTRAMUSCULAR | Status: AC
  Administered 2024-07-02 (×2): 40 mg via INTRAVENOUS
  Filled 2024-07-01 (×2): qty 2

## 2024-07-01 MED ORDER — CLOPIDOGREL BISULFATE 75 MG PO TABS
75.0000 mg | ORAL_TABLET | Freq: Every day | ORAL | Status: DC
  Administered 2024-07-01 – 2024-07-03 (×3): 75 mg via ORAL
  Filled 2024-07-01 (×3): qty 1

## 2024-07-01 MED ORDER — ONDANSETRON HCL 4 MG/2ML IJ SOLN: 4.0000 mg | Freq: Four times a day (QID) | INTRAMUSCULAR | Status: DC | PRN

## 2024-07-01 MED ORDER — OSELTAMIVIR PHOSPHATE 75 MG PO CAPS
75.0000 mg | ORAL_CAPSULE | Freq: Two times a day (BID) | ORAL | Status: DC
  Administered 2024-07-02 – 2024-07-03 (×4): 75 mg via ORAL
  Filled 2024-07-01 (×7): qty 1

## 2024-07-01 MED ORDER — METHYLPREDNISOLONE SODIUM SUCC 125 MG IJ SOLR
125.0000 mg | Freq: Once | INTRAMUSCULAR | Status: AC
  Administered 2024-07-01: 125 mg via INTRAVENOUS
  Filled 2024-07-01: qty 2

## 2024-07-01 MED ORDER — IOHEXOL 350 MG/ML SOLN
100.0000 mL | Freq: Once | INTRAVENOUS | Status: AC | PRN
  Administered 2024-07-01: 75 mL via INTRAVENOUS

## 2024-07-01 MED ORDER — LOSARTAN POTASSIUM 25 MG PO TABS
50.0000 mg | ORAL_TABLET | Freq: Every day | ORAL | Status: DC
  Administered 2024-07-02 – 2024-07-03 (×2): 50 mg via ORAL
  Filled 2024-07-01 (×2): qty 2

## 2024-07-01 MED ORDER — AMLODIPINE BESYLATE 5 MG PO TABS
10.0000 mg | ORAL_TABLET | Freq: Every day | ORAL | Status: DC
  Administered 2024-07-02 – 2024-07-03 (×2): 10 mg via ORAL
  Filled 2024-07-01 (×2): qty 2

## 2024-07-01 MED ORDER — IPRATROPIUM-ALBUTEROL 0.5-2.5 (3) MG/3ML IN SOLN
3.0000 mL | Freq: Four times a day (QID) | RESPIRATORY_TRACT | Status: AC
  Administered 2024-07-01 – 2024-07-02 (×3): 3 mL via RESPIRATORY_TRACT
  Filled 2024-07-01 (×4): qty 3

## 2024-07-01 MED ORDER — ACETAMINOPHEN 325 MG PO TABS
650.0000 mg | ORAL_TABLET | Freq: Four times a day (QID) | ORAL | Status: DC | PRN
  Administered 2024-07-02: 650 mg via ORAL
  Filled 2024-07-01: qty 2

## 2024-07-01 NOTE — Assessment & Plan Note (Signed)
 Continue aspirin  81 mg a day Plavix  75 mg a day Lipitor  40 mg a day

## 2024-07-01 NOTE — Assessment & Plan Note (Signed)
 Per EDP, he discussed with vascular who recommends aspirin  daily Patient is already on aspirin  81 mg daily Aspirin  81 mg daily resumed

## 2024-07-01 NOTE — Assessment & Plan Note (Signed)
 Suspect multifactorial in setting of tobacco use and influenza A Symptomatic support with DuoNebs 4 times daily, 4 doses ordered on admission As needed albuterol  nebulizer for wheezing and shortness of breath Home long-acting/maintenance inhaler equivalent resumed

## 2024-07-01 NOTE — Assessment & Plan Note (Signed)
 In the setting of lung disease COPD decreased p.o. intake dehydration And electrolytes rehydrate check TSH

## 2024-07-01 NOTE — Subjective & Objective (Signed)
 Presents with shortness of breath cough and chest pain started about 2 days ago no sick contacts Known history of COPD also CAD Reports of chest pressure and heaviness in the center of his chest But did not endorse any fevers or chills Dry cough dyspnea He tried to use inhaler but does not seem to help Patient continues to smoke Sick contacts Patient is on Plavix  In ER tested positive for influenza A Dimer elevated one 1.02 CTA negative for PE CT did show scattered pulmonary nodularity inflammatory versus infectious but nonspecific Incidental finding of irregular mural thrombus within aortic arch and descending   thoracic aorta Vascular surgery was consulted Dr. Silver who recommended starting on aspirin  Patient already on Plavix  VBG showing no evidence of significant retention Patient ambulated and noted to drop his O2 down to 85% on room air

## 2024-07-01 NOTE — Assessment & Plan Note (Signed)
Home atorvastatin 40 mg nightly resumed

## 2024-07-01 NOTE — Assessment & Plan Note (Signed)
 Restarted Norvasc  10 mg a day Cozaar  50 mg a day

## 2024-07-01 NOTE — Assessment & Plan Note (Signed)
 Aspirin  81 mg daily, Plavix  resumed

## 2024-07-01 NOTE — ED Notes (Signed)
 Spoke with Debby Formica for vascular consult

## 2024-07-01 NOTE — Assessment & Plan Note (Signed)
 1-2 lidocaine  patch every 24 hours as needed

## 2024-07-01 NOTE — Assessment & Plan Note (Signed)
Lipitor 40mg  ad ay

## 2024-07-01 NOTE — ED Notes (Signed)
 Pt ambulated to bathroom and oxygen  dropped to 86%. Pt report feeling more short of breath. RR 33, HR 108.

## 2024-07-01 NOTE — Assessment & Plan Note (Addendum)
 As needed Tussionex nightly, 3 nights ordered

## 2024-07-01 NOTE — Assessment & Plan Note (Signed)
 Influenza  a infection Droplet precautions Given hypoxia need for admission and complications started on Tamiflu 

## 2024-07-01 NOTE — Assessment & Plan Note (Signed)
 Chest pain troponin unremarkable EKG without evidence of ischemic changes monitor on telemetry Suspect musculoskeletal continue to observe and monitor

## 2024-07-01 NOTE — ED Notes (Signed)
 Called [facility] to transport the patient to [facility] rm# [id number]

## 2024-07-01 NOTE — Assessment & Plan Note (Addendum)
 Check MRSA PCR Airborne precaution

## 2024-07-01 NOTE — Assessment & Plan Note (Signed)
 this patient has acute respiratory failure with Hypoxia   as documented by the presence of following: O2 saturatio< 90% on RA   Likely due to:   COPD exacerbation,   Continuous pulse ox   check Pulse ox with ambulation prior to discharge   may need  TC consult for home O2 set up    flutter valve ordered

## 2024-07-01 NOTE — ED Notes (Signed)
 Virtual admitting provider at bedside

## 2024-07-01 NOTE — Plan of Care (Signed)
 Jackson Mathews FMW:[id number] DOB: 28-Dec-1955 DOA: 07/01/2024     PCP: Jackson Sharper, MD   Outpatient Specialists:    Cardiology dr. [facility]  Dr. Meade   Patient arrived to ER on 07/01/24 at 0853 Referred by Attending Jackson Ales, MD   Patient coming from:    home Lives alone,        Chief Complaint:   Chief Complaint  Patient presents with   Shortness of Breath    HPI: Jackson Mathews is a 68 y.o. male with medical history significant of COPD chronic respiratory failure with hypoxia, tobacco abuse, CAD, carotid artery occlusion, recurrent chest pain, dyspnea, HLD, HTN    Presented with worsening shortness of breath chest pain  Presents with shortness of breath cough and chest pain started about 2 days ago no sick contacts Known history of COPD also CAD Reports of chest pressure and heaviness in the center of his chest But did not endorse any fevers or chills Dry cough dyspnea He tried to use inhaler but does not seem to help Patient continues to smoke Sick contacts Patient is on Plavix  In ER tested positive for influenza A Dimer elevated one 1.02 CTA negative for PE CT did show scattered pulmonary nodularity inflammatory versus infectious but nonspecific Incidental finding of irregular mural thrombus within aortic arch and descending   thoracic aorta Vascular surgery was consulted Jackson Mathews who recommended starting on aspirin  Patient already on Plavix  VBG showing no evidence of significant retention Patient ambulated and noted to drop his O2 down to 85% on room air     He has O2 at home to use as needed 2 L  But he hardly ever needs it  Reports Chest pain was related to coughing and sneezing His chest Is tender  He has not flu shot this year  He was taking steroids few weeks ago for energy He was also short of breath too  The SOB he was experiencing today was much more severe  Denies significant ETOH intake   Does   smoke  but  interested in quitting    Regarding pertinent Chronic problems:    Hyperlipidemia - on statins crestor Lipid Panel     Component Value Date/Time   CHOL 97 06/29/2021 0457   TRIG 56 09/12/2023 0752   HDL 37 (L) 06/29/2021 0457   CHOLHDL 2.6 06/29/2021 0457   VLDL 10 06/29/2021 0457   LDLCALC 50 06/29/2021 0457     HTN on    chronic CHF diastolic - last echo  Recent Results (from the past 56199 hours)  ECHOCARDIOGRAM COMPLETE   Collection Time: 09/06/23 10:55 AM  Result Value   Weight 2,208.13   Height 69.016   BP 109/76   Single Plane A2C EF 48.8   Single Plane A4C EF 57.1   Calc EF 50.4   S' Lateral 2.60   AR max vel 2.20   AV Area VTI 2.50   AV Mean grad 2.0   AV Peak grad 3.1   Ao pk vel 0.87   Area-P 1/2 4.39   AV Area mean vel 1.86   MV VTI 1.92   Est EF 50 - 55%   Narrative      ECHOCARDIOGRAM REPORT       Patient Name:   Jackson Mathews Date of Exam: 09/06/2023 Medical Rec #:  [id number]          Height:       69.0 in Accession #:  [id number]         Weight:       138.0 lb Date of Birth:  Jul 13, 1956          BSA:          1.765 m Patient Age:    67 years           BP:           108/66 mmHg Patient Gender: M                  HR:           73 bpm. Exam Location:  Inpatient  Procedure: 2D Echo, Cardiac Doppler and Color Doppler (Both Spectral and Color            Flow Doppler were utilized during procedure).  Indications:    Shock   History:        Patient has prior history of Echocardiogram examinations, most                 recent 06/10/2022. CAD, COPD, Signs/Symptoms:Murmur and Dyspnea;                 Risk Factors:Current Smoker, Hypertension and Dyslipidemia.                 Carotid disease.   Sonographer:    Jackson Mathews Referring Phys: Jackson Mathews  IMPRESSIONS    1. Left ventricular ejection fraction, by estimation, is 50 to 55%. Left ventricular ejection fraction by 2D MOD biplane is 50.4 %. The left ventricle has low normal  function. The left ventricle has no regional wall motion abnormalities. Left  ventricular diastolic parameters are consistent with Grade I diastolic dysfunction (impaired relaxation).  2. Right ventricular systolic function is normal. The right ventricular size is normal. Tricuspid regurgitation signal is inadequate for assessing PA pressure.  3. The mitral valve is grossly normal. Trivial mitral valve regurgitation.  4. The aortic valve is tricuspid. Aortic valve regurgitation is not visualized. Aortic valve sclerosis/calcification is present, without any evidence of aortic stenosis.  5. The inferior vena cava is dilated in size with <50% respiratory variability, suggesting right atrial pressure of 15 mmHg.  Comparison(s): Changes from prior study are noted. 06/10/2022: LVEF 55-60%.            CAD  - On Aspirin , statin, betablocker, Plavix                  [facility] cardiac cath 2017    COPD -  followed by pulmonology   on baseline oxygen    as needed only      While in ER: Clinical Course as of 07/01/24 2223  Mon Jul 01, 2024  1000 Influenza A By PCR(!): POSITIVE [JL]  1000 D-Dimer, Quant(!): 1.02 [JL]    Clinical Course User Index [JL] Jackson Agent, MD       Lab Orders         Resp panel by RT-PCR (RSV, Flu A&B, Covid) Anterior Nasal Swab         MRSA Next Gen by PCR, Nasal         Respiratory (~20 pathogens) panel by PCR         Expectorated Sputum Assessment w Gram Stain, Rflx to Resp Cult         Basic metabolic panel         CBC with Differential         Brain natriuretic peptide  D-dimer, quantitative         Basic metabolic panel         CBC         Comprehensive metabolic panel with GFR         CK         Magnesium          Phosphorus         HIV Antibody (routine testing w rflx)         Creatinine, urine, random         Osmolality, urine         Osmolality         Sodium, urine, random         T4, free         T3         TSH         Prealbumin          Procalcitonin         I-Stat venous blood gas, ([facility], [facility], [facility])       CXR - COPD    CTA chest -   no PE,  no evidence of infiltrate Irregular mural thrombus within the aortic arch and descending thoracic aorta.  Following Medications were ordered in ER: Medications  aspirin  EC tablet 81 mg (has no administration in time range)  amLODipine  (NORVASC ) tablet 10 mg (has no administration in time range)  atorvastatin  (LIPITOR ) tablet 40 mg (40 mg Oral Given 07/01/24 2047)  losartan  (COZAAR ) tablet 50 mg (has no administration in time range)  nicotine  (NICODERM CQ  - dosed in mg/24 hours) patch 21 mg (has no administration in time range)  pantoprazole  (PROTONIX ) EC tablet 40 mg (has no administration in time range)  clopidogrel  (PLAVIX ) tablet 75 mg (75 mg Oral Given 07/01/24 1844)  lidocaine  (LIDODERM ) 5 % 1-2 patch (1 patch Transdermal Patch Applied 07/01/24 1559)  acetaminophen  (TYLENOL ) tablet 650 mg (has no administration in time range)    Or  acetaminophen  (TYLENOL ) suppository 650 mg (has no administration in time range)  ondansetron  (ZOFRAN ) tablet 4 mg (has no administration in time range)    Or  ondansetron  (ZOFRAN ) injection 4 mg (has no administration in time range)  enoxaparin  (LOVENOX ) injection 40 mg (40 mg Subcutaneous Given 07/01/24 2048)  senna-docusate (Senokot-S) tablet 1 tablet (has no administration in time range)  ipratropium-albuterol  (DUONEB) 0.5-2.5 (3) MG/3ML nebulizer solution 3 mL (3 mLs Nebulization Given 07/01/24 2023)  fluticasone  furoate-vilanterol (BREO ELLIPTA ) 200-25 MCG/ACT 1 puff (1 puff Inhalation Given 07/01/24 2023)  fluticasone  (FLONASE ) 50 MCG/ACT nasal spray 2 spray (has no administration in time range)  chlorpheniramine-HYDROcodone (TUSSIONEX) 10-8 MG/5ML suspension 5 mL (5 mLs Oral Given 07/01/24 2047)  albuterol  (PROVENTIL ) (2.5 MG/3ML) 0.083% nebulizer solution 2.5 mg (has no administration in time range)  sodium chloride  flush (NS) 0.9 % injection 3  mL (has no administration in time range)  azithromycin  (ZITHROMAX ) 500 mg in sodium chloride  0.9 % 250 mL IVPB (has no administration in time range)    Followed by  azithromycin  (ZITHROMAX ) tablet 500 mg (has no administration in time range)  methylPREDNISolone  sodium succinate (SOLU-MEDROL ) 125 mg/2 mL injection 40 mg (has no administration in time range)    Followed by  predniSONE  (DELTASONE ) tablet 40 mg (has no administration in time range)  oseltamivir  (TAMIFLU ) capsule 75 mg (has no administration in time range)  ipratropium-albuterol  (DUONEB) 0.5-2.5 (3) MG/3ML nebulizer solution 6 mL (6 mLs Nebulization Given 07/01/24 0908)  methylPREDNISolone  sodium succinate (SOLU-MEDROL ) 125 mg/2 mL injection 125 mg (125 mg Intravenous Given 07/01/24 0911)  magnesium  sulfate IVPB 2 g 50 mL (0 g Intravenous Stopped 07/01/24 1012)  albuterol  (PROVENTIL ) (2.5 MG/3ML) 0.083% nebulizer solution 5 mg (5 mg Nebulization Given 07/01/24 0931)  iohexol  (OMNIPAQUE ) 350 MG/ML injection 100 mL (75 mLs Intravenous Contrast Given 07/01/24 1033)       ED Triage Vitals  Encounter Vitals Group     BP 07/01/24 0904 (!) 152/79     Girls Systolic BP Percentile --      Girls Diastolic BP Percentile --      Boys Systolic BP Percentile --      Boys Diastolic BP Percentile --      Pulse Rate 07/01/24 0901 (!) 124     Resp 07/01/24 0901 (!) 25     Temp 07/01/24 0900 99.7 F (37.6 C)     Temp Source 07/01/24 0900 Oral     SpO2 07/01/24 0901 (!) 88 %     Weight 07/01/24 0910 140 lb (63.5 kg)     Height 07/01/24 0910 5' 9 (1.753 m)     Head Circumference --      Peak Flow --      Pain Score 07/01/24 1113 0     Pain Loc --      Pain Education --      Exclude from Growth Chart --   UFJK(75)@     _________________________________________ Significant initial  Findings: Abnormal Labs Reviewed  RESP PANEL BY RT-PCR (RSV, FLU A&B, COVID)  RVPGX2 - Abnormal; Notable for the following components:      Result Value    Influenza A by PCR POSITIVE (*)    All other components within normal limits  BASIC METABOLIC PANEL WITH GFR - Abnormal; Notable for the following components:   Sodium 127 (*)    Chloride 92 (*)    Glucose, Bld 131 (*)    All other components within normal limits  CBC WITH DIFFERENTIAL/PLATELET - Abnormal; Notable for the following components:   Lymphs Abs 0.3 (*)    All other components within normal limits  D-DIMER, QUANTITATIVE - Abnormal; Notable for the following components:   D-Dimer, Quant 1.02 (*)    All other components within normal limits  I-STAT VENOUS BLOOD GAS, ED - Abnormal; Notable for the following components:   pCO2, Ven 36.8 (*)    pO2, Ven 67 (*)    Acid-base deficit 4.0 (*)    Sodium 125 (*)    Calcium , Ion 1.05 (*)    All other components within normal limits      _________________________ Troponin <`15 <15   ECG: Ordered Personally reviewed and interpreted by me showing: HR : 118 Rhythm: Sinus tachycardia Atrial premature complex Probable left atrial enlargement Anterior infarct, old QTC 393  BNP (last 3 results) No results for input(s): BNP in the last 8760 hours.    Recent Labs    07/01/24 0909  DDIMER 1.02*     The recent clinical data is shown below. Vitals:   07/01/24 1645 07/01/24 1811 07/01/24 2024 07/01/24 2045  BP: (!) 142/85 (!) 145/68  (!) 162/70  Pulse: 84 90  98  Resp: (!) 25 20  20   Temp:  97.9 F (36.6 C)  98.3 F (36.8 C)  TempSrc:  Oral  Oral  SpO2: 97% 97% 96% 96%  Weight:      Height:        WBC  Component Value Date/Time   WBC 6.6 07/01/2024 0908   LYMPHSABS 0.3 (L) 07/01/2024 0908   MONOABS 0.6 07/01/2024 0908   EOSABS 0.0 07/01/2024 0908   BASOSABS 0.0 07/01/2024 0908    Procalcitonin   Ordered       Results for orders placed or performed during the hospital encounter of 07/01/24  Resp panel by RT-PCR (RSV, Flu A&B, Covid) Anterior Nasal Swab     Status: Abnormal   Collection Time: 07/01/24  9:08  AM   Specimen: Anterior Nasal Swab  Result Value Ref Range Status   SARS Coronavirus 2 by RT PCR NEGATIVE NEGATIVE Final         Influenza A by PCR POSITIVE (A) NEGATIVE Final   Influenza B by PCR NEGATIVE NEGATIVE Final         Resp Syncytial Virus by PCR NEGATIVE NEGATIVE Final        MRSA Next Gen by PCR, Nasal     Status: None   Collection Time: 07/01/24  6:53 PM   Specimen: Nasal Mucosa; Nasal Swab  Result Value Ref Range Status   MRSA by PCR Next Gen NOT DETECTED NOT DETECTED Final          ________________________________________________________________  Venous  Blood Gas result:  pH  7.370 Potassium 3.7 mmol/L   pCO2, Ven 36.8 Low  mmHg Calcium , Ion 1.05 Low  mmol/L  pO2, Ven 67 High  mmHg     __________________________________________________________ Recent Labs  Lab 07/01/24 0908 07/01/24 1056  NA 127* 125*  K 4.2 3.7  CO2 23  --   GLUCOSE 131*  --   BUN 10  --   CREATININE 0.81  --   CALCIUM  9.1  --     Cr   stable,   Lab Results  Component Value Date   CREATININE 0.81 07/01/2024   CREATININE 0.66 09/12/2023   CREATININE 0.69 09/11/2023    No results for input(s): AST, ALT, ALKPHOS, BILITOT, PROT, ALBUMIN in the last 168 hours. Lab Results  Component Value Date   CALCIUM  9.1 07/01/2024   PHOS 3.3 09/10/2023    Plt: Lab Results  Component Value Date   PLT 166 07/01/2024       Recent Labs  Lab 07/01/24 0908 07/01/24 1056  WBC 6.6  --   NEUTROABS 5.8  --   HGB 14.7 13.9  HCT 43.7 41.0  MCV 88.8  --   PLT 166  --     HG/HCT  stable,      Component Value Date/Time   HGB 13.9 07/01/2024 1056   HCT 41.0 07/01/2024 1056   MCV 88.8 07/01/2024 0908     Hospitalist was called for admission for   Acute respiratory failure with hypoxia   COPD exacerbation   Influenza A  Aortic mural thrombus (HCC)     The following Work up has been ordered so far:  Orders Placed This Encounter  Procedures   Resp panel by RT-PCR (RSV,  Flu A&B, Covid) Anterior Nasal Swab   MRSA Next Gen by PCR, Nasal   Respiratory (~20 pathogens) panel by PCR   Expectorated Sputum Assessment w Gram Stain, Rflx to Resp Cult   DG Chest Port 1 View   CT Angio Chest PE W and/or Wo Contrast   Basic metabolic panel   CBC with Differential   Brain natriuretic peptide   D-dimer, quantitative   Basic metabolic panel   CBC   Comprehensive metabolic panel with GFR   CK  Magnesium    Phosphorus   HIV Antibody (routine testing w rflx)   Creatinine, urine, random   Osmolality, urine   Osmolality   Sodium, urine, random   T4, free   T3   TSH   Prealbumin   Procalcitonin   Diet Heart Room service appropriate? Yes; Fluid consistency: Thin   ED Cardiac monitoring   Check Pulse Oximetry while ambulating   Place TED hose   Vital signs   Notify physician (specify)   Mobility Protocol: No Restrictions   Refer to Sidebar Report Mobility Protocol for Adult Inpatient   Initiate Adult Central Line Maintenance and Catheter Clearance Protocol for patients with central line (CVC, PICC, Port, Hemodialysis, Trialysis)   Initiate CHG Protocol for patients in ICU/SD or any patient with a central line or foley catheter   Do not place and if present remove PureWick   Initiate Oral Care Protocol   Initiate Carrier Fluid Protocol   RN may order General Admission PRN Orders utilizing General Admission PRN medications (through manage orders) for the following patient needs: allergy symptoms (Claritin ), cold sores (Carmex), cough (Robitussin DM), eye irritation (Liquifilm Tears), hemorrhoids (Tucks), indigestion (Maalox), minor skin irritation (Hydrocortisone Cream), muscle pain Lucienne Gay), nose irritation (saline nasal spray) and sore throat (Chloraseptic spray).   Refer to Sidebar Report:   Apply Chronic Obstructive Pulmonary Disease Care Plan   Cardiac Monitoring Continuous x 24 hours Indications for use: Other; other indications for use: copd exacerbation    Nurse to provide smoking / tobacco cessation education   Intake and Output   Apply Chronic Obstructive Pulmonary Disease Care Plan   Elevate head of bed   Maintain IV access   Maintain IV access   Turn cough deep breathe   Assess   Check Pulse Oximetry while ambulating   RN may order General Admission PRN Orders utilizing General Admission PRN medications (through manage orders) for the following patient needs: allergy symptoms (Claritin ), cold sores (Carmex), cough (Robitussin DM), eye irritation (Liquifilm Tears), hemorrhoids (Tucks), indigestion (Maalox), minor skin irritation (Hydrocortisone Cream), muscle pain Lucienne Gay), nose irritation (saline nasal spray) and sore throat (Chloraseptic spray).   Full code   Consult to vascular surgery   Consult to hospitalist   Consult to respiratory care treatment   Consult to Registered Dietitian   Droplet precaution   OT eval and treat   PT eval and treat   ED Pulse oximetry, continuous   Oxygen  therapy Mode or (Route): Nasal cannula; Liters Per Minute: 2; Keep O2 saturation between: greater than 92 %   Incentive spirometry   Flutter valve   Oxygen  therapy Mode or (Route): Nasal cannula   Pulse oximetry, continuous   I-Stat venous blood gas, ([facility], [facility], [facility])   ED EKG   EKG 12-Lead   12 lead EKG   Insert peripheral IV   Admit to Inpatient (patient's expected length of stay will be greater than 2 midnights or inpatient only procedure)     OTHER Significant initial  Findings:  labs showing:     DM  labs:  HbA1C: Recent Labs    09/06/23 0832  HGBA1C 5.4       CBG (last 3)  No results for input(s): GLUCAP in the last 72 hours.        Cultures:    Component Value Date/Time   SDES  09/05/2023 1920    BLOOD RIGHT ANTECUBITAL Performed at [facility], [address] [place], [place] [address]  SPECREQUEST  09/05/2023 1920    BOTTLES DRAWN AEROBIC AND ANAEROBIC Blood Culture adequate  volume Performed at [facility], [address], [place], [place] [address]    CULT  09/05/2023 1920    NO GROWTH 5 DAYS Performed at [facility], [address], [place], [place] [address]    REPTSTATUS 09/10/2023 FINAL 09/05/2023 1920     Radiological Exams on Admission: CT Angio Chest PE W and/or Wo Contrast Result Date: 07/01/2024 CLINICAL DATA:  Pulmonary embolism (PE) suspected, low to intermediate prob, positive D-dimer Shortness of breath with cough and chest pain for 2 days. EXAM: CT ANGIOGRAPHY CHEST WITH CONTRAST TECHNIQUE: Multidetector CT imaging of the chest was performed using the standard protocol during bolus administration of intravenous contrast. Multiplanar CT image reconstructions and MIPs were obtained to evaluate the vascular anatomy. RADIATION DOSE REDUCTION: This exam was performed according to the departmental dose-optimization program which includes automated exposure control, adjustment of the mA and/or kV according to patient size and/or use of iterative reconstruction technique. CONTRAST:  75mL OMNIPAQUE  IOHEXOL  350 MG/ML SOLN COMPARISON:  Radiographs 07/01/2024 and 09/06/2023. FINDINGS: Cardiovascular: The pulmonary arteries are well opacified with contrast to the level of the segmental branches. There is no evidence of acute pulmonary embolism. Atherosclerosis of the aorta, great vessels and coronary arteries. No definite acute systemic arterial abnormalities. There is irregular mural thrombus within the aortic arch and descending aorta. The heart size is normal. There is no pericardial effusion. Mediastinum/Nodes: There are no enlarged mediastinal, hilar or axillary lymph nodes. Small hiatal hernia. The thyroid  gland appears unremarkable. Lungs/Pleura: No pleural effusion or pneumothorax. Mild centrilobular emphysema with diffuse central airway thickening and scattered ill-defined pulmonary nodularity bilaterally. In the right lower lobe, there  are small clustered nodules measuring up to 9 x 7 mm on image 114/6. On the axial images, this appears potentially cavitary, not substantiated on the reformatted images. There are additional scattered smaller nodules, including a 5 mm left upper lobe nodule on image 31/6 and a 4 mm left lower lobe nodule on image 91/6. No confluent airspace disease. Upper abdomen: No acute findings are demonstrated within the visualized upper abdomen. There is aortic and branch vessel atherosclerosis. Variant vascular anatomy with a probable replaced right hepatic artery. Musculoskeletal/Chest wall: There is no chest wall mass or suspicious osseous finding. Mild spondylosis. Review of the MIP images confirms the above findings. IMPRESSION: 1. No evidence of acute pulmonary embolism or other acute vascular findings in the chest. 2. Scattered ill-defined pulmonary nodularity bilaterally, largest in the right lower lobe measuring up to 9 x 7 mm. These are likely inflammatory/infectious, but nonspecific. Per Fleischner Society Guidelines,if patient is low risk for malignancy, no routine follow-up imaging is recommended. If patient is high risk for malignancy, a non-contrast Chest CT at 12 months is optional. If performed and the nodule is stable at 12 months, no further follow-up is recommended. These guidelines do not apply to immunocompromised patients and patients with cancer. Follow up in patients with significant comorbidities as clinically warranted. For lung cancer screening, adhere to Lung-RADS guidelines. Reference: Radiology. 2017; 284(1):228-43. 3. No confluent airspace disease or pleural effusion. 4. Irregular mural thrombus within the aortic arch and descending thoracic aorta. 5. Aortic Atherosclerosis (ICD10-I70.0) and Emphysema (ICD10-J43.9). Electronically Signed   By: Elsie Perone M.D.   On: 07/01/2024 11:47   DG Chest Port 1 View Result Date: 07/01/2024 EXAM: 1 VIEW(S) XRAY OF THE CHEST 07/01/2024 09:19:00 AM  COMPARISON: 09/06/2023 CLINICAL  HISTORY: SOB, cough FINDINGS: LINES, TUBES AND DEVICES: Multiple wires and leads project over the chest on the frontal radiograph. LUNGS AND PLEURA: Hyperinflation. No focal pulmonary opacity. No pleural effusion. No pneumothorax. HEART AND MEDIASTINUM: No acute abnormality of the cardiac and mediastinal silhouettes. BONES AND SOFT TISSUES: No acute osseous abnormality. IMPRESSION: 1. Hyperinflation, likely representing COPD. No superimposed acute process. Electronically signed by: Rockey Kilts MD 07/01/2024 10:15 AM EST RP Workstation: [id number]   _______________________________________________________________________________________________________ Latest  Blood pressure (!) 162/70, pulse 98, temperature 98.3 F (36.8 C), temperature source Oral, resp. rate 20, height 5' 9 (1.753 m), weight 63.5 kg, SpO2 96%.   Vitals  labs and radiology finding personally reviewed  Review of Systems:    Pertinent positives include:  shortness of breath at rest. dyspnea on exertion chest pain Constitutional:  No weight loss, night sweats, Fevers, chills, fatigue, weight loss  HEENT:  No headaches, Difficulty swallowing,Tooth/dental problems,Sore throat,  No sneezing, itching, ear ache, nasal congestion, post nasal drip,  Cardio-vascular:  No , Orthopnea, PND, anasarca, dizziness, palpitations.no Bilateral lower extremity swelling  GI:  No heartburn, indigestion, abdominal pain, nausea, vomiting, diarrhea, change in bowel habits, loss of appetite, melena, blood in stool, hematemesis Resp:  no , No excess mucus, no productive cough, No non-productive cough, No coughing up of blood.No change in color of mucus.No wheezing. Skin:  no rash or lesions. No jaundice GU:  no dysuria, change in color of urine, no urgency or frequency. No straining to urinate.  No flank pain.  Musculoskeletal:  No joint pain or no joint swelling. No decreased range of motion. No back pain.  Psych:   No change in mood or affect. No depression or anxiety. No memory loss.  Neuro: no localizing neurological complaints, no tingling, no weakness, no double vision, no gait abnormality, no slurred speech, no confusion  All systems reviewed and apart from HOPI all are negative _______________________________________________________________________________________________ Past Medical History:   Past Medical History:  Diagnosis Date   CAD in native artery-cath 09/24/15 non obstructive 09/24/2015   Calf pain    Cancer (HCC)    Skin   Carotid artery occlusion    Chest pain    COPD (chronic obstructive pulmonary disease) (HCC)    Dyspnea    Hearing loss    Heart murmur    History of kidney stones    History of right-sided carotid endarterectomy 09/23/2015   Hx of colonic polyp - ssp 06/21/2015   Hypercholesteremia    Hypertension    Hypoxia 10/12/2018   Lower back pain    Palpitations    Tinnitus    Tobacco use 09/23/2015   Vertigo       Past Surgical History:  Procedure Laterality Date   CARDIAC CATHETERIZATION N/A 09/24/2015   Procedure: Left Heart Cath and Coronary Angiography;  Surgeon: Lonni JONETTA Cash, MD;  Location: [facility] INVASIVE CV LAB;  Service: Cardiovascular;  Laterality: N/A;   CAROTID ENDARTERECTOMY  05/05/11   Right CEA   MELANOMA EXCISION     l eye   SKIN CANCER EXCISION     TRANSCAROTID ARTERY REVASCULARIZATION  Right 06/28/2021   Procedure: RIGHT TRANSCAROTID ARTERY REVASCULARIZATION;  Surgeon: Lanis Fonda BRAVO, MD;  Location: [facility] OR;  Service: Vascular;  Laterality: Right;    Social History:  Ambulatory   independently     reports that he has been smoking cigarettes. He started smoking about 53 years ago. He has a 107.9 pack-year smoking history. He has never used smokeless tobacco.  He reports that he does not drink alcohol  and does not use drugs.   Family History:   Family History  Problem Relation Age of Onset   Heart disease Mother    Cancer Father     Heart attack Father        s/p CABG   Emphysema Father    Diabetes Brother    Emphysema Paternal Grandmother    Colon cancer Neg Hx    Esophageal cancer Neg Hx    Ulcerative colitis Neg Hx    Stomach cancer Neg Hx    Rectal cancer Neg Hx    ______________________________________________________________________________________________ Allergies: Allergies  Allergen Reactions   Codeine Nausea Only     Prior to Admission medications   Medication Sig Start Date End Date Taking? Authorizing Provider  predniSONE  (DELTASONE ) 10 MG tablet SMARTSIG:- Tablet(s) By Mouth - 06/11/24  Yes [provider]  albuterol  (VENTOLIN  HFA) 108 (90 Base) MCG/ACT inhaler Inhale 2 puffs into the lungs 3 (three) times daily for 5 days, THEN 2 puffs every 6 (six) hours as needed. 09/12/23 09/16/24  Sebastian Toribio GAILS, MD  amLODipine  (NORVASC ) 10 MG tablet Take 1 tablet (10 mg total) by mouth daily. 09/13/23   Sebastian Toribio GAILS, MD  aspirin  EC 81 MG tablet Take 1 tablet (81 mg total) by mouth daily. Swallow whole. 06/12/21   Lanis Fonda BRAVO, MD  atorvastatin  (LIPITOR ) 40 MG tablet Take 1 tablet (40 mg total) by mouth daily. 06/11/21   Setzer, Sandra J, PA-C  budesonide -formoterol  (SYMBICORT ) 160-4.5 MCG/ACT inhaler Inhale 2 puffs into the lungs 2 (two) times daily. 03/06/24   Desai, Nikita S, MD  clopidogrel  (PLAVIX ) 75 MG tablet Take 75 mg by mouth daily. Patient not taking: Reported on 02/28/2024    [provider]  fluticasone  (FLONASE ) 50 MCG/ACT nasal spray Place 2 sprays into both nostrils daily for 7 days. 09/13/23 02/28/24  Sebastian Toribio GAILS, MD  HYDROMET 5-1.5 MG/5ML syrup Take 5 mLs by mouth every 4 (four) hours as needed for cough. 09/04/23   [provider]  loratadine  (CLARITIN ) 10 MG tablet Take 1 tablet (10 mg total) by mouth daily. 09/13/23   Sebastian Toribio GAILS, MD  losartan  (COZAAR ) 50 MG tablet Take 1 tablet (50 mg total) by mouth daily. 09/13/23   Sebastian Toribio GAILS, MD   nicotine  (NICODERM CQ  - DOSED IN MG/24 HOURS) 14 mg/24hr patch Place 1 patch (14 mg total) onto the skin daily. Patient not taking: Reported on 02/28/2024 09/13/23   Sebastian Toribio GAILS, MD  pantoprazole  (PROTONIX ) 40 MG tablet Take 1 tablet (40 mg total) by mouth daily at 6 (six) AM. 09/13/23   Sebastian Toribio GAILS, MD  Polyethyl Glycol-Propyl Glycol (SYSTANE OP) Place 1 drop into both eyes daily as needed (dry eyes).    [provider]  Respiratory Therapy Supplies (NEBULIZER) DEVI 1 Device by Does not apply route as needed. 10/14/18   Claudene Maximino LABOR, MD    ___________________________________________________________________________________________________ Physical Exam:    07/01/2024    8:45 PM 07/01/2024    6:11 PM 07/01/2024    4:45 PM  Vitals with BMI  Systolic 162 145 857  Diastolic 70 68 85  Pulse 98 90 84     1. General:  in No  Acute distress    Chronically ill   -appearing 2. Psychological: Alert and   Oriented 3. Head/ENT:   Dry Mucous Membranes  Head Non traumatic, neck supple                      Poor Dentition 4. SKIN:  decreased Skin turgor,  Skin clean Dry and intact no rash    5. Heart: Regular rate and rhythm no  Murmur, no Rub or gallop 6. Lungs:   no wheezes or crackles   7. Abdomen: Soft,  non-tender, Non distended *** obese  bowel sounds present 8. Lower extremities: no clubbing, cyanosis, no  edema 9. Neurologically Grossly intact, moving all 4 extremities equally  10. MSK: Normal range of motion    Chart has been reviewed  ______________________________________________________________________________________________  Assessment/Plan 68 y.o. male with medical history significant of COPD chronic respiratory failure with hypoxia, tobacco abuse, CAD, carotid artery occlusion, recurrent chest pain, dyspnea, HLD, HTN   Admitted for   Acute respiratory failure with hypoxia      COPD exacerbation   Influenza A   Aortic mural  thrombus     Present on Admission:  COPD exacerbation (HCC)  Influenza A  Carotid artery disease  Essential hypertension  HLD (hyperlipidemia)  Tobacco use  Thrombus of aorta (HCC)  CAD in native artery-cath 09/24/15 non obstructive  Hyponatremia  Acute respiratory failure with hypoxia (HCC)  CHEST PAIN     Essential hypertension Home losartan  50 mg daily, amlodipine  10 mg daily resumed  HLD (hyperlipidemia) Home atorvastatin  40 mg nightly resumed  Tobacco use As needed nicotine  patch ordered  Musculoskeletal chest pain 1-2 lidocaine  patch every 24 hours as needed  Carotid artery disease Aspirin  81 mg daily, Plavix  resumed  Thrombus of aorta (HCC) Per EDP, he discussed with vascular who recommends aspirin  daily Patient is already on aspirin  81 mg daily Aspirin  81 mg daily resumed  Influenza A Check MRSA PCR Airborne precaution  COPD exacerbation (HCC) Suspect multifactorial in setting of tobacco use and influenza A Symptomatic support with DuoNebs 4 times daily, 4 doses ordered on admission As needed albuterol  nebulizer for wheezing and shortness of breath Home long-acting/maintenance inhaler equivalent resumed  Cough As needed Tussionex nightly, 3 nights ordered  CAD in native artery-cath 09/24/15 non obstructive Continue aspirin  81 mg a day Plavix  75 mg a day Lipitor  40 mg a day  Carotid artery disease Continue aspirin  81 mg during the day and Plavix   COPD exacerbation (HCC)  -  - Will initiate: Steroid taper  -  Antibiotics azithromycin  - Albuterol  PRN, - scheduled duoneb,    -  Mucinex .  Titrate O2 to saturation >90%. Follow patients respiratory status.  influenza PCR positive for influenza A Droplet precautions Started on Tamiflu    VBG no evidence of hypercarbia     Essential hypertension Restarted Norvasc  10 mg a day Cozaar  50 mg a day  HLD (hyperlipidemia) Lipitor  40 mg a day  Hyponatremia In the setting of lung disease COPD decreased  p.o. intake dehydration And electrolytes rehydrate check TSH  Acute respiratory failure with hypoxia (HCC)  this patient has acute respiratory failure with Hypoxia   as documented by the presence of following: O2 saturatio< 90% on RA   Likely due to:   COPD exacerbation,   Continuous pulse ox   check Pulse ox with ambulation prior to discharge   may need  TC consult for home O2 set up    flutter valve ordered   Thrombus of aorta (HCC) Continue aspirin  81 mg a day and Plavix  vascular surgery has been notified recommended to make sure patient is  on aspirin   Tobacco use  - Spoke about importance of quitting spent 5 minutes discussing options for treatment, prior attempts at quitting, and dangers of smoking  -At this point patient is     interested in quitting  - order nicotine  patch   - nursing tobacco cessation protocol   CHEST PAIN Chest pain troponin unremarkable EKG without evidence of ischemic changes monitor on telemetry Suspect musculoskeletal continue to observe and monitor  Influenza A Influenza  a infection Droplet precautions Given hypoxia need for admission and complications started on Tamiflu      Other plan as per orders.  DVT prophylaxis:  Lovenox    enoxaparin  (LOVENOX ) injection 40 mg Start: 07/01/24 2200 Place TED hose Start: 07/01/24 1450    Code Status:    Code Status: Full Code FULL CODE   Family Communication:   Family not at  Bedside    Diet  Diet Orders (From admission, onward)     Start     Ordered   07/01/24 1450  Diet Heart Room service appropriate? Yes; Fluid consistency: Thin  Diet effective now       Question Answer Comment  Room service appropriate? Yes   Fluid consistency: Thin      07/01/24 1453            Disposition Plan:      To home once workup is complete and patient is stable         Consult Orders  (From admission, onward)           Start     Ordered   07/01/24 2212  PT eval and treat  Routine       Question:   Reason for PT?  Answer:  COPD   07/01/24 2213   07/01/24 2212  OT eval and treat  Routine       Question:  Reason for OT?  Answer:  COPD   07/01/24 2213   07/01/24 2212  Consult to respiratory care treatment  Once       Provider:  (Not yet assigned)  Question:  Reason for Consult?  Answer:  Educate patient regarding use of nebulizers and MDI   07/01/24 2213   07/01/24 2212  Consult to Registered Dietitian  Once       Provider:  (Not yet assigned)  Question:  Reason for consult?  Answer:  Assessment of nutrition requirements/status   07/01/24 2213   07/01/24 1307  Consult to hospitalist  Called [facility]  Once       Provider:  (Not yet assigned)  Question Answer Comment  Place call to: [facility]   Reason for Consult Admit      07/01/24 1306                               Would benefit from PT/OT eval prior to DC  Ordered                                     Consults called: Vascular surgery was not made aware   Admission status:  ED Disposition     ED Disposition  Admit   Condition  --   Comment  H[facility] [[id number]]  Level of Care: Telemetry [5]  May admit patient to [facility] or [facility] if equivalent level of care is  available:: Yes  Interfacility transfer: Yes  Diagnosis: COPD exacerbation [facility]) [[id number]]  Admitting Physician: SHERRE GREIG SAILOR [[id number]]  Attending Physician: COX, AMY N [id number]  Certification:: I certify this patient will need inpatient services for at least 2 midnights  Expected Medical Readiness: 07/03/2024             inpatient     I Expect 2 midnight stay secondary to severity of patient's current illness need for inpatient interventions justified by the following:  hemodynamic instability despite optimal treatment (tachycardia  hypoxia, )   Severe lab/radiological/exam abnormalities including:    Acute respiratory failure with hypoxia    COPD exacerbation (HCC)    Influenza A     Aortic mural thrombus (HCC)    and extensive comorbidities including:  substance abuse  CAD   COPD/asthma   That are currently affecting medical management.   I expect  patient to be hospitalized for 2 midnights requiring inpatient medical care.  Patient is at high risk for adverse outcome (such as loss of life or disability) if not treated.  Indication for inpatient stay as follows:   Hemodynamic instability despite maximal medical therapy,      New or worsening hypoxia    Need for IV antibiotics, IV fluids,, IV pain medications,     Level of care     tele  For 24H      tele indefinitely please discontinue once patient no longer qualifies COVID-19 Labs   Daron Breeding 07/01/2024, 11:42 PM    [facility]     after 2 AM please page floor coverage   If 7AM-7PM, please contact the day team taking care of the patient using [email or web address]

## 2024-07-01 NOTE — Assessment & Plan Note (Signed)
 -  As needed nicotine patch ordered ?

## 2024-07-01 NOTE — ED Notes (Addendum)
 Gerlean Agent MD notified of CT results

## 2024-07-01 NOTE — Hospital Course (Signed)
 Mr. Jackson Mathews is a 68 year old male with history of current tobacco use, hyperlipidemia, hypertension, GERD, COPD with as needed oxygen  requirements at home.  07/01/2024: Patient presented to the ED for chief concerns of shortness of breath, cough, chest pain for 2 days.  Vitals at the time of my evaluation showed t of 98.2, rr 27, hr 87, blood pressure 136/70, SpO2 on 2 L nasal cannula.  Serum sodium showed 127, potassium 4.2, chloride 92, bicarb 23, BUN of 10, serum creatinine 0.81, eGFR greater than 60, nonfasting glucose 131, WBC 6.6, hemoglobin 14.7, platelets of 166.  D-dimer was 1.02.  Patient tested positive for influenza A.  ED treatment: Magnesium  2 g IV one-time dose, DuoNebs one-time treatment, Solu-Medrol  125 mg IV one-time dose.

## 2024-07-01 NOTE — Assessment & Plan Note (Signed)
 Home losartan  50 mg daily, amlodipine  10 mg daily resumed

## 2024-07-01 NOTE — ED Triage Notes (Signed)
 SOB, cough, and chest pain onset 2 days ago. Denies sick contacts. Denies nausea or vomiting.

## 2024-07-01 NOTE — Assessment & Plan Note (Signed)
 Continue aspirin  81 mg during the day and Plavix 

## 2024-07-01 NOTE — Assessment & Plan Note (Signed)
-  Spoke about importance of quitting spent 5 minutes discussing options for treatment, prior attempts at quitting, and dangers of smoking ? -At this point patient is    interested in quitting ? - order nicotine patch  ? - nursing tobacco cessation protocol ? ?

## 2024-07-01 NOTE — ED Provider Notes (Signed)
 [place] EMERGENCY DEPARTMENT AT [facility] Provider Note   CSN: [id number] Arrival date & time: 07/01/24  9146     Patient presents with: Shortness of Breath   Jackson Mathews is a 68 y.o. male.    Shortness of Breath Associated symptoms: chest pain and cough      68 year old male with medical history significant for COPD, HTN, HLD, CAD presenting to the emergency department with a chief complaint of cough, shortness of breath and chest discomfort that came on 2 days ago.  The patient endorses a chest pressure and heaviness sensation in the center of his chest.  He denies any fevers or chills.  His cough has been dry.  He is not on anticoagulation.  He also endorses worsening dyspnea despite his home inhaler use.  He continues to smoke.  No known sick contacts.  Prior to Admission medications   Medication Sig Start Date End Date Taking? Authorizing Provider  albuterol  (VENTOLIN  HFA) 108 (90 Base) MCG/ACT inhaler Inhale 2 puffs into the lungs 3 (three) times daily for 5 days, THEN 2 puffs every 6 (six) hours as needed. 09/12/23 09/16/24  Sebastian Toribio GAILS, MD  amLODipine  (NORVASC ) 10 MG tablet Take 1 tablet (10 mg total) by mouth daily. 09/13/23   Sebastian Toribio GAILS, MD  aspirin  EC 81 MG tablet Take 1 tablet (81 mg total) by mouth daily. Swallow whole. 06/12/21   Lanis Fonda BRAVO, MD  atorvastatin  (LIPITOR ) 40 MG tablet Take 1 tablet (40 mg total) by mouth daily. 06/11/21   Setzer, Sandra J, PA-C  budesonide -formoterol  (SYMBICORT ) 160-4.5 MCG/ACT inhaler Inhale 2 puffs into the lungs 2 (two) times daily. 03/06/24   Desai, Nikita S, MD  clopidogrel  (PLAVIX ) 75 MG tablet Take 75 mg by mouth daily. Patient not taking: Reported on 02/28/2024    [provider]  fluticasone  (FLONASE ) 50 MCG/ACT nasal spray Place 2 sprays into both nostrils daily for 7 days. 09/13/23 02/28/24  Sebastian Toribio GAILS, MD  HYDROMET 5-1.5 MG/5ML syrup Take 5 mLs by mouth every 4 (four) hours as  needed for cough. 09/04/23   [provider]  loratadine  (CLARITIN ) 10 MG tablet Take 1 tablet (10 mg total) by mouth daily. 09/13/23   Sebastian Toribio GAILS, MD  losartan  (COZAAR ) 50 MG tablet Take 1 tablet (50 mg total) by mouth daily. 09/13/23   Sebastian Toribio GAILS, MD  nicotine  (NICODERM CQ  - DOSED IN MG/24 HOURS) 14 mg/24hr patch Place 1 patch (14 mg total) onto the skin daily. Patient not taking: Reported on 02/28/2024 09/13/23   Sebastian Toribio GAILS, MD  pantoprazole  (PROTONIX ) 40 MG tablet Take 1 tablet (40 mg total) by mouth daily at 6 (six) AM. 09/13/23   Sebastian Toribio GAILS, MD  Polyethyl Glycol-Propyl Glycol (SYSTANE OP) Place 1 drop into both eyes daily as needed (dry eyes).    [provider]  Respiratory Therapy Supplies (NEBULIZER) DEVI 1 Device by Does not apply route as needed. 10/14/18   Claudene Maximino LABOR, MD    Allergies: Codeine    Review of Systems  Respiratory:  Positive for cough and shortness of breath.   Cardiovascular:  Positive for chest pain.  All other systems reviewed and are negative.   Updated Vital Signs BP 136/70   Pulse 89   Temp 98.2 F (36.8 C) (Oral)   Resp (!) 26   Ht 5' 9 (1.753 m)   Wt 63.5 kg   SpO2 96%   BMI 20.67 kg/m   Physical  Exam Vitals and nursing note reviewed.  Constitutional:      General: He is not in acute distress.    Appearance: He is well-developed.  HENT:     Head: Normocephalic and atraumatic.  Eyes:     Conjunctiva/sclera: Conjunctivae normal.  Cardiovascular:     Rate and Rhythm: Normal rate and regular rhythm.     Heart sounds: No murmur heard. Pulmonary:     Effort: Pulmonary effort is normal. No respiratory distress.     Breath sounds: Examination of the right-upper field reveals wheezing. Examination of the left-upper field reveals wheezing. Examination of the right-middle field reveals wheezing. Examination of the left-middle field reveals wheezing. Examination of the right-lower field reveals wheezing.  Examination of the left-lower field reveals wheezing. Wheezing present.  Abdominal:     Palpations: Abdomen is soft.     Tenderness: There is no abdominal tenderness.  Musculoskeletal:        General: No swelling.     Cervical back: Neck supple.  Skin:    General: Skin is warm and dry.     Capillary Refill: Capillary refill takes less than 2 seconds.  Neurological:     Mental Status: He is alert.  Psychiatric:        Mood and Affect: Mood normal.     (all labs ordered are listed, but only abnormal results are displayed) Labs Reviewed  RESP PANEL BY RT-PCR (RSV, FLU A&B, COVID)  RVPGX2 - Abnormal; Notable for the following components:      Result Value   Influenza A by PCR POSITIVE (*)    All other components within normal limits  BASIC METABOLIC PANEL WITH GFR - Abnormal; Notable for the following components:   Sodium 127 (*)    Chloride 92 (*)    Glucose, Bld 131 (*)    All other components within normal limits  CBC WITH DIFFERENTIAL/PLATELET - Abnormal; Notable for the following components:   Lymphs Abs 0.3 (*)    All other components within normal limits  D-DIMER, QUANTITATIVE - Abnormal; Notable for the following components:   D-Dimer, Quant 1.02 (*)    All other components within normal limits  I-STAT VENOUS BLOOD GAS, ED - Abnormal; Notable for the following components:   pCO2, Ven 36.8 (*)    pO2, Ven 67 (*)    Acid-base deficit 4.0 (*)    Sodium 125 (*)    Calcium , Ion 1.05 (*)    All other components within normal limits  PRO BRAIN NATRIURETIC PEPTIDE  TROPONIN T, HIGH SENSITIVITY  TROPONIN T, HIGH SENSITIVITY    EKG: EKG Interpretation Date/Time:  Monday July 01 2024 09:01:01 EST Ventricular Rate:  118 PR Interval:  135 QRS Duration:  75 QT Interval:  280 QTC Calculation: 393 R Axis:   77  Text Interpretation: Sinus tachycardia Atrial premature complex Probable left atrial enlargement Anterior infarct, old Confirmed by Jerrol Agent ([id number]) on  07/01/2024 11:07:08 AM  Radiology: CT Angio Chest PE W and/or Wo Contrast Result Date: 07/01/2024 CLINICAL DATA:  Pulmonary embolism (PE) suspected, low to intermediate prob, positive D-dimer Shortness of breath with cough and chest pain for 2 days. EXAM: CT ANGIOGRAPHY CHEST WITH CONTRAST TECHNIQUE: Multidetector CT imaging of the chest was performed using the standard protocol during bolus administration of intravenous contrast. Multiplanar CT image reconstructions and MIPs were obtained to evaluate the vascular anatomy. RADIATION DOSE REDUCTION: This exam was performed according to the departmental dose-optimization program which includes automated exposure control, adjustment of  the mA and/or kV according to patient size and/or use of iterative reconstruction technique. CONTRAST:  75mL OMNIPAQUE  IOHEXOL  350 MG/ML SOLN COMPARISON:  Radiographs 07/01/2024 and 09/06/2023. FINDINGS: Cardiovascular: The pulmonary arteries are well opacified with contrast to the level of the segmental branches. There is no evidence of acute pulmonary embolism. Atherosclerosis of the aorta, great vessels and coronary arteries. No definite acute systemic arterial abnormalities. There is irregular mural thrombus within the aortic arch and descending aorta. The heart size is normal. There is no pericardial effusion. Mediastinum/Nodes: There are no enlarged mediastinal, hilar or axillary lymph nodes. Small hiatal hernia. The thyroid  gland appears unremarkable. Lungs/Pleura: No pleural effusion or pneumothorax. Mild centrilobular emphysema with diffuse central airway thickening and scattered ill-defined pulmonary nodularity bilaterally. In the right lower lobe, there are small clustered nodules measuring up to 9 x 7 mm on image 114/6. On the axial images, this appears potentially cavitary, not substantiated on the reformatted images. There are additional scattered smaller nodules, including a 5 mm left upper lobe nodule on image 31/6 and  a 4 mm left lower lobe nodule on image 91/6. No confluent airspace disease. Upper abdomen: No acute findings are demonstrated within the visualized upper abdomen. There is aortic and branch vessel atherosclerosis. Variant vascular anatomy with a probable replaced right hepatic artery. Musculoskeletal/Chest wall: There is no chest wall mass or suspicious osseous finding. Mild spondylosis. Review of the MIP images confirms the above findings. IMPRESSION: 1. No evidence of acute pulmonary embolism or other acute vascular findings in the chest. 2. Scattered ill-defined pulmonary nodularity bilaterally, largest in the right lower lobe measuring up to 9 x 7 mm. These are likely inflammatory/infectious, but nonspecific. Per Fleischner Society Guidelines,if patient is low risk for malignancy, no routine follow-up imaging is recommended. If patient is high risk for malignancy, a non-contrast Chest CT at 12 months is optional. If performed and the nodule is stable at 12 months, no further follow-up is recommended. These guidelines do not apply to immunocompromised patients and patients with cancer. Follow up in patients with significant comorbidities as clinically warranted. For lung cancer screening, adhere to Lung-RADS guidelines. Reference: Radiology. 2017; 284(1):228-43. 3. No confluent airspace disease or pleural effusion. 4. Irregular mural thrombus within the aortic arch and descending thoracic aorta. 5. Aortic Atherosclerosis (ICD10-I70.0) and Emphysema (ICD10-J43.9). Electronically Signed   By: Elsie Perone M.D.   On: 07/01/2024 11:47   DG Chest Port 1 View Result Date: 07/01/2024 EXAM: 1 VIEW(S) XRAY OF THE CHEST 07/01/2024 09:19:00 AM COMPARISON: 09/06/2023 CLINICAL HISTORY: SOB, cough FINDINGS: LINES, TUBES AND DEVICES: Multiple wires and leads project over the chest on the frontal radiograph. LUNGS AND PLEURA: Hyperinflation. No focal pulmonary opacity. No pleural effusion. No pneumothorax. HEART AND  MEDIASTINUM: No acute abnormality of the cardiac and mediastinal silhouettes. BONES AND SOFT TISSUES: No acute osseous abnormality. IMPRESSION: 1. Hyperinflation, likely representing COPD. No superimposed acute process. Electronically signed by: Rockey Kilts MD 07/01/2024 10:15 AM EST RP Workstation: [id number]     Procedures   Medications Ordered in the ED  aspirin  EC tablet 81 mg (has no administration in time range)  amLODipine  (NORVASC ) tablet 10 mg (has no administration in time range)  atorvastatin  (LIPITOR ) tablet 40 mg (has no administration in time range)  losartan  (COZAAR ) tablet 50 mg (has no administration in time range)  nicotine  (NICODERM CQ  - dosed in mg/24 hours) patch 21 mg (has no administration in time range)  pantoprazole  (PROTONIX ) EC tablet 40 mg (has no administration in  time range)  clopidogrel  (PLAVIX ) tablet 75 mg (has no administration in time range)  lidocaine  (LIDODERM ) 5 % 1-2 patch (has no administration in time range)  acetaminophen  (TYLENOL ) tablet 650 mg (has no administration in time range)    Or  acetaminophen  (TYLENOL ) suppository 650 mg (has no administration in time range)  ondansetron  (ZOFRAN ) tablet 4 mg (has no administration in time range)    Or  ondansetron  (ZOFRAN ) injection 4 mg (has no administration in time range)  enoxaparin  (LOVENOX ) injection 40 mg (has no administration in time range)  senna-docusate (Senokot-S) tablet 1 tablet (has no administration in time range)  ipratropium-albuterol  (DUONEB) 0.5-2.5 (3) MG/3ML nebulizer solution 3 mL (has no administration in time range)  ipratropium-albuterol  (DUONEB) 0.5-2.5 (3) MG/3ML nebulizer solution 6 mL (6 mLs Nebulization Given 07/01/24 0908)  methylPREDNISolone  sodium succinate (SOLU-MEDROL ) 125 mg/2 mL injection 125 mg (125 mg Intravenous Given 07/01/24 0911)  magnesium  sulfate IVPB 2 g 50 mL (0 g Intravenous Stopped 07/01/24 1012)  albuterol  (PROVENTIL ) (2.5 MG/3ML) 0.083% nebulizer solution  5 mg (5 mg Nebulization Given 07/01/24 0931)  iohexol  (OMNIPAQUE ) 350 MG/ML injection 100 mL (75 mLs Intravenous Contrast Given 07/01/24 1033)    Clinical Course as of 07/01/24 1459  Mon Jul 01, 2024  1000 Influenza A By PCR(!): POSITIVE [JL]  1000 D-Dimer, Quant(!): 1.02 [JL]    Clinical Course User Index [JL] Jerrol Agent, MD                                 Medical Decision Making Amount and/or Complexity of Data Reviewed Labs: ordered. Decision-making details documented in ED Course. Radiology: ordered.  Risk Prescription drug management. Decision regarding hospitalization.     68 year old male with medical history significant for COPD, HTN, HLD, CAD presenting to the emergency department with a chief complaint of cough, shortness of breath and chest discomfort that came on 2 days ago.  The patient endorses a chest pressure and heaviness sensation in the center of his chest.  He denies any fevers or chills.  His cough has been dry.  He is not on anticoagulation.  He also endorses worsening dyspnea despite his home inhaler use.  He continues to smoke.  No known sick contacts.  On arrival, the patient was afebrile, temperature 99.7, tachycardic heart rate 124, tachypneic RR 25, BP 152/79, saturating 88% on room air, subsequently placed on 2 L O2 via nasal cannula.  Concern for COPD exacerbation with wheezing noted on arrival, also consider viral infection, PE, ACS, pneumonia, pneumothorax.  IV access was obtained and the patient was administered IV magnesium  and IV Solu-Medrol  in addition to multiple DuoNebs x 3.  Initial EKG: Sinus tachycardia, ventricular rate 118, no acute ischemic changes  Chest x-ray: No osseous abnormality, hyperinflation likely representing COPD with no acute process  Labs: CBC without a leukocytosis or anemia, D-dimer elevated at 1.02, VBG with a pH of 7.37, pCO2 37, HCO3 21, cardiac troponin normal, BNP normal, BMP with mild hyponatremia to 127 otherwise  unremarkable, PCR testing positive for flu A.  In the setting of elevated D-dimer, CTA was obtained: IMPRESSION:  1. No evidence of acute pulmonary embolism or other acute vascular  findings in the chest.  2. Scattered ill-defined pulmonary nodularity bilaterally, largest  in the right lower lobe measuring up to 9 x 7 mm. These are likely  inflammatory/infectious, but nonspecific. Per Fleischner Society  Guidelines,if patient is low risk for  malignancy, no routine  follow-up imaging is recommended. If patient is high risk for  malignancy, a non-contrast Chest CT at 12 months is optional. If  performed and the nodule is stable at 12 months, no further  follow-up is recommended. These guidelines do not apply to  immunocompromised patients and patients with cancer. Follow up in  patients with significant comorbidities as clinically warranted. For  lung cancer screening, adhere to Lung-RADS guidelines. Reference:  Radiology. 2017; 284(1):228-43.  3. No confluent airspace disease or pleural effusion.  4. Irregular mural thrombus within the aortic arch and descending  thoracic aorta.  5. Aortic Atherosclerosis (ICD10-I70.0) and Emphysema (ICD10-J43.9).    Regarding the patient's aortic thrombus, I consulted vascular surgery on-call and spoke with Dr. Silver who recommended initiation of aspirin .  Patient was still symptomatic and on ambulation his oxygen  saturations dropped down to 85% on room air.  In the setting of being flu a positive and known COPD with concern for COPD exacerbation, hospitalist medicine was consulted for admission, Dr. Sherre accepting.     Final diagnoses:  Acute respiratory failure with hypoxia (HCC)  COPD exacerbation (HCC)  Influenza A  Aortic mural thrombus [facility])    ED Discharge Orders     None          Jerrol Agent, MD 07/01/24 1459

## 2024-07-01 NOTE — Assessment & Plan Note (Signed)
-  -   Will initiate: Steroid taper  -  Antibiotics azithromycin  - Albuterol  PRN, - scheduled duoneb,    -  Mucinex .  Titrate O2 to saturation >90%. Follow patients respiratory status.  influenza PCR positive for influenza A Droplet precautions Started on Tamiflu    VBG no evidence of hypercarbia

## 2024-07-01 NOTE — H&P (Signed)
 History and Physical   Jackson Mathews FMW:[id number] DOB: 1955/09/28 DOA: 07/01/2024  PCP: Hughie Sharper, MD  Patient coming from: home  Referring provider: Dr. Jerrol Telemedicine provider: Dr. Sherre Patient location: [facility] health emergency department at [facility] Referring diagnosis: COPD exacerbation, influenza A Patient name and DOB verified: Patient was able to confirm his first last name, and his date of birth. Patient consented to Telemedicine Evaluation: Yes RN virtual assistant: Eric Efrain Heinrich, RN Video encounter time and date: 07/01/24 at approximately 14:35.  Chief Concern: shortness of breath  HPI: Mr. Jackson Mathews is a 68 year old male with history of current tobacco use, hyperlipidemia, hypertension, GERD, COPD with as needed oxygen  requirements at home.  07/01/2024: Patient presented to the ED for chief concerns of shortness of breath, cough, chest pain for 2 days.  Vitals at the time of my evaluation showed t of 98.2, rr 27, hr 87, blood pressure 136/70, SpO2 on 2 L nasal cannula.  Serum sodium showed 127, potassium 4.2, chloride 92, bicarb 23, BUN of 10, serum creatinine 0.81, eGFR greater than 60, nonfasting glucose 131, WBC 6.6, hemoglobin 14.7, platelets of 166.  D-dimer was 1.02.  Patient tested positive for influenza A.  ED treatment: Magnesium  2 g IV one-time dose, DuoNebs one-time treatment, Solu-Medrol  125 mg IV one-time dose. -------------------------------- At bedside via telemedicine encounter, patient was able to confirm his first and last name, age, date of birth, location, current calendar year.  Patient reports he was in his normal state of health yesterday and even went to the store to buy 6 packs of cigarettes.  He reports he felt fine.  He denies known sick contacts.    Last evening, he developed worsening cough, shortness of breath, chest pain.  He denies trauma to his person.  He reports he just hit him.  He denies fever,  chills, nausea, vomiting, diarrhea, dysuria, hematuria, blood in his stool, syncope.  Social history: He lives at home on his own.  He denies alcohol  and recreational drug use.  He smokes about 2 packs of cigarettes per day.  His last cigarette was yesterday.  He is retired and previously worked in holiday representative.  ROS: Constitutional: no weight change, no fever ENT/Mouth: no sore throat, no rhinorrhea Eyes: no eye pain, no vision changes Cardiovascular:  + dyspnea,  no edema, no palpitations Respiratory: + cough, no sputum, no wheezing Gastrointestinal: no nausea, no vomiting, no diarrhea, no constipation Genitourinary: no urinary incontinence, no dysuria, no hematuria Musculoskeletal: no arthralgias, no myalgias Skin: no skin lesions, no pruritus, Neuro: + weakness, no loss of consciousness, no syncope Psych: no anxiety, no depression, no decrease appetite Heme/Lymph: no bruising, no bleeding  ED Course: Discussed with EDP, patient requiring hospitalization for chief concerns of COPD exacerbation.  Assessment/Plan  Principal Problem:   COPD exacerbation (HCC) Active Problems:   Musculoskeletal chest pain   Carotid artery disease   Essential hypertension   HLD (hyperlipidemia)   Tobacco use   Influenza A   Thrombus of aorta (HCC)   Cough   Assessment and Plan:  * COPD exacerbation (HCC) Suspect multifactorial in setting of tobacco use and influenza A Symptomatic support with DuoNebs 4 times daily, 4 doses ordered on admission As needed albuterol  nebulizer for wheezing and shortness of breath Home long-acting/maintenance inhaler equivalent resumed  Musculoskeletal chest pain 1-2 lidocaine  patch every 24 hours as needed  Cough As needed Tussionex nightly, 3 nights ordered  Thrombus of aorta (HCC) Per EDP, he discussed with vascular  who recommends aspirin  daily Patient is already on aspirin  81 mg daily Aspirin  81 mg daily resumed  Influenza A Check MRSA PCR Airborne  precaution  Tobacco use As needed nicotine  patch ordered  HLD (hyperlipidemia) Home atorvastatin  40 mg nightly resumed  Essential hypertension Home losartan  50 mg daily, amlodipine  10 mg daily resumed  Carotid artery disease Aspirin  81 mg daily, Plavix  resumed  Chart reviewed.   DVT prophylaxis: Enoxaparin  Code Status: Full code Diet: Heart healthy Family Communication: A phone call was offered, patient declined stating that he will update his sister himself Disposition Plan: Pending clinical course Consults called: None at this time Admission status: Telemetry  Past Medical History:  Diagnosis Date   CAD in native artery-cath 09/24/15 non obstructive 09/24/2015   Calf pain    Cancer (HCC)    Skin   Carotid artery occlusion    Chest pain    COPD (chronic obstructive pulmonary disease) (HCC)    Dyspnea    Hearing loss    Heart murmur    History of kidney stones    History of right-sided carotid endarterectomy 09/23/2015   Hx of colonic polyp - ssp 06/21/2015   Hypercholesteremia    Hypertension    Hypoxia 10/12/2018   Lower back pain    Palpitations    Tinnitus    Tobacco use 09/23/2015   Vertigo    Past Surgical History:  Procedure Laterality Date   CARDIAC CATHETERIZATION N/A 09/24/2015   Procedure: Left Heart Cath and Coronary Angiography;  Surgeon: Lonni JONETTA Cash, MD;  Location: [facility] INVASIVE CV LAB;  Service: Cardiovascular;  Laterality: N/A;   CAROTID ENDARTERECTOMY  05/05/11   Right CEA   MELANOMA EXCISION     l eye   SKIN CANCER EXCISION     TRANSCAROTID ARTERY REVASCULARIZATION  Right 06/28/2021   Procedure: RIGHT TRANSCAROTID ARTERY REVASCULARIZATION;  Surgeon: Lanis Fonda BRAVO, MD;  Location: [facility] OR;  Service: Vascular;  Laterality: Right;   Social History:  reports that he has been smoking cigarettes. He started smoking about 53 years ago. He has a 107.9 pack-year smoking history. He has never used smokeless tobacco. He reports that he does not  drink alcohol  and does not use drugs.  Allergies  Allergen Reactions   Codeine Nausea Only   Family History  Problem Relation Age of Onset   Heart disease Mother    Cancer Father    Heart attack Father        s/p CABG   Emphysema Father    Diabetes Brother    Emphysema Paternal Grandmother    Colon cancer Neg Hx    Esophageal cancer Neg Hx    Ulcerative colitis Neg Hx    Stomach cancer Neg Hx    Rectal cancer Neg Hx    Family history: Family history reviewed and not pertinent.  Prior to Admission medications   Medication Sig Start Date End Date Taking? Authorizing Provider  predniSONE  (DELTASONE ) 10 MG tablet SMARTSIG:- Tablet(s) By Mouth - 06/11/24  Yes [provider]  albuterol  (VENTOLIN  HFA) 108 (90 Base) MCG/ACT inhaler Inhale 2 puffs into the lungs 3 (three) times daily for 5 days, THEN 2 puffs every 6 (six) hours as needed. 09/12/23 09/16/24  Sebastian Toribio GAILS, MD  amLODipine  (NORVASC ) 10 MG tablet Take 1 tablet (10 mg total) by mouth daily. 09/13/23   Sebastian Toribio GAILS, MD  aspirin  EC 81 MG tablet Take 1 tablet (81 mg total) by mouth daily. Swallow whole. 06/12/21  Lanis Fonda BRAVO, MD  atorvastatin  (LIPITOR ) 40 MG tablet Take 1 tablet (40 mg total) by mouth daily. 06/11/21   Setzer, Sandra J, PA-C  budesonide -formoterol  (SYMBICORT ) 160-4.5 MCG/ACT inhaler Inhale 2 puffs into the lungs 2 (two) times daily. 03/06/24   Meade Verdon RAMAN, MD  clopidogrel  (PLAVIX ) 75 MG tablet Take 75 mg by mouth daily. Patient not taking: Reported on 02/28/2024    [provider]  fluticasone  (FLONASE ) 50 MCG/ACT nasal spray Place 2 sprays into both nostrils daily for 7 days. 09/13/23 02/28/24  Sebastian Toribio GAILS, MD  HYDROMET 5-1.5 MG/5ML syrup Take 5 mLs by mouth every 4 (four) hours as needed for cough. 09/04/23   [provider]  loratadine  (CLARITIN ) 10 MG tablet Take 1 tablet (10 mg total) by mouth daily. 09/13/23   Sebastian Toribio GAILS, MD  losartan  (COZAAR ) 50 MG tablet  Take 1 tablet (50 mg total) by mouth daily. 09/13/23   Sebastian Toribio GAILS, MD  nicotine  (NICODERM CQ  - DOSED IN MG/24 HOURS) 14 mg/24hr patch Place 1 patch (14 mg total) onto the skin daily. Patient not taking: Reported on 02/28/2024 09/13/23   Sebastian Toribio GAILS, MD  pantoprazole  (PROTONIX ) 40 MG tablet Take 1 tablet (40 mg total) by mouth daily at 6 (six) AM. 09/13/23   Sebastian Toribio GAILS, MD  Polyethyl Glycol-Propyl Glycol (SYSTANE OP) Place 1 drop into both eyes daily as needed (dry eyes).    [provider]  Respiratory Therapy Supplies (NEBULIZER) DEVI 1 Device by Does not apply route as needed. 10/14/18   Claudene Maximino LABOR, MD   Physical Exam: Vitals:   07/01/24 1615 07/01/24 1630 07/01/24 1645 07/01/24 1811  BP: (!) 143/67 132/65 (!) 142/85 (!) 145/68  Pulse: 82 87 84 90  Resp: (!) 22 (!) 22 (!) 25   Temp: 98.4 F (36.9 C)   97.9 F (36.6 C)  TempSrc: Oral     SpO2: 98% 97% 97% 97%  Weight:      Height:       Physical exam was completed with the assistance of Eric Efrain Heinrich, RN, who was present at bedside during this portion of the telemedicine encounter.  Constitutional: appears age appropriate, NAD, calm Eyes: EOMI, conjunctivae normal ENMT: Mucous membranes are moist. Hearing appropriate Neck: normal, and midline Respiratory: Bilateral decreased lung sounds in lungs at the apex, coarse crackles in the bilateral lower lobes. Normal respiratory effort with nasal cannula in place. No accessory muscle use.  Cardiovascular: Regular rate and rhythm, no murmurs. No extremity edema.  Abdomen: no tenderness. Bowel sounds positive.  Musculoskeletal:  Good ROM, no contractures, no atrophy.  Skin: no rashes, lesions on visible skin appreciated. Neurologic: Sensation intact. Strength appropriate in all 4.  Psychiatric: Normal judgment and insight. Alert and oriented x 3. Normal mood.   EKG: independently reviewed, showing sinus tachycardia with rate of 118, QTc 393  Chest  x-ray on Admission: I personally reviewed and I agree with radiologist reading as below.  CT Angio Chest PE W and/or Wo Contrast Result Date: 07/01/2024 CLINICAL DATA:  Pulmonary embolism (PE) suspected, low to intermediate prob, positive D-dimer Shortness of breath with cough and chest pain for 2 days. EXAM: CT ANGIOGRAPHY CHEST WITH CONTRAST TECHNIQUE: Multidetector CT imaging of the chest was performed using the standard protocol during bolus administration of intravenous contrast. Multiplanar CT image reconstructions and MIPs were obtained to evaluate the vascular anatomy. RADIATION DOSE REDUCTION: This exam was performed according to the departmental dose-optimization program which  includes automated exposure control, adjustment of the mA and/or kV according to patient size and/or use of iterative reconstruction technique. CONTRAST:  75mL OMNIPAQUE  IOHEXOL  350 MG/ML SOLN COMPARISON:  Radiographs 07/01/2024 and 09/06/2023. FINDINGS: Cardiovascular: The pulmonary arteries are well opacified with contrast to the level of the segmental branches. There is no evidence of acute pulmonary embolism. Atherosclerosis of the aorta, great vessels and coronary arteries. No definite acute systemic arterial abnormalities. There is irregular mural thrombus within the aortic arch and descending aorta. The heart size is normal. There is no pericardial effusion. Mediastinum/Nodes: There are no enlarged mediastinal, hilar or axillary lymph nodes. Small hiatal hernia. The thyroid  gland appears unremarkable. Lungs/Pleura: No pleural effusion or pneumothorax. Mild centrilobular emphysema with diffuse central airway thickening and scattered ill-defined pulmonary nodularity bilaterally. In the right lower lobe, there are small clustered nodules measuring up to 9 x 7 mm on image 114/6. On the axial images, this appears potentially cavitary, not substantiated on the reformatted images. There are additional scattered smaller nodules,  including a 5 mm left upper lobe nodule on image 31/6 and a 4 mm left lower lobe nodule on image 91/6. No confluent airspace disease. Upper abdomen: No acute findings are demonstrated within the visualized upper abdomen. There is aortic and branch vessel atherosclerosis. Variant vascular anatomy with a probable replaced right hepatic artery. Musculoskeletal/Chest wall: There is no chest wall mass or suspicious osseous finding. Mild spondylosis. Review of the MIP images confirms the above findings. IMPRESSION: 1. No evidence of acute pulmonary embolism or other acute vascular findings in the chest. 2. Scattered ill-defined pulmonary nodularity bilaterally, largest in the right lower lobe measuring up to 9 x 7 mm. These are likely inflammatory/infectious, but nonspecific. Per Fleischner Society Guidelines,if patient is low risk for malignancy, no routine follow-up imaging is recommended. If patient is high risk for malignancy, a non-contrast Chest CT at 12 months is optional. If performed and the nodule is stable at 12 months, no further follow-up is recommended. These guidelines do not apply to immunocompromised patients and patients with cancer. Follow up in patients with significant comorbidities as clinically warranted. For lung cancer screening, adhere to Lung-RADS guidelines. Reference: Radiology. 2017; 284(1):228-43. 3. No confluent airspace disease or pleural effusion. 4. Irregular mural thrombus within the aortic arch and descending thoracic aorta. 5. Aortic Atherosclerosis (ICD10-I70.0) and Emphysema (ICD10-J43.9). Electronically Signed   By: Elsie Perone M.D.   On: 07/01/2024 11:47   DG Chest Port 1 View Result Date: 07/01/2024 EXAM: 1 VIEW(S) XRAY OF THE CHEST 07/01/2024 09:19:00 AM COMPARISON: 09/06/2023 CLINICAL HISTORY: SOB, cough FINDINGS: LINES, TUBES AND DEVICES: Multiple wires and leads project over the chest on the frontal radiograph. LUNGS AND PLEURA: Hyperinflation. No focal pulmonary  opacity. No pleural effusion. No pneumothorax. HEART AND MEDIASTINUM: No acute abnormality of the cardiac and mediastinal silhouettes. BONES AND SOFT TISSUES: No acute osseous abnormality. IMPRESSION: 1. Hyperinflation, likely representing COPD. No superimposed acute process. Electronically signed by: Rockey Kilts MD 07/01/2024 10:15 AM EST RP Workstation: [id number]   Labs on Admission: I have personally reviewed following labs  CBC: Recent Labs  Lab 07/01/24 0908 07/01/24 1056  WBC 6.6  --   NEUTROABS 5.8  --   HGB 14.7 13.9  HCT 43.7 41.0  MCV 88.8  --   PLT 166  --    Basic Metabolic Panel: Recent Labs  Lab 07/01/24 0908 07/01/24 1056  NA 127* 125*  K 4.2 3.7  CL 92*  --   CO2 23  --  GLUCOSE 131*  --   BUN 10  --   CREATININE 0.81  --   CALCIUM  9.1  --    GFR: Estimated Creatinine Clearance: 79.5 mL/min (by C-G formula based on SCr of 0.81 mg/dL).  BNP (last 3 results) Recent Labs    07/01/24 0908  PROBNP 188.0   Urine analysis:    Component Value Date/Time   COLORURINE YELLOW 06/25/2021 0820   APPEARANCEUR CLEAR 06/25/2021 0820   LABSPEC <1.005 (L) 06/25/2021 0820   PHURINE 6.5 06/25/2021 0820   GLUCOSEU NEGATIVE 06/25/2021 0820   HGBUR NEGATIVE 06/25/2021 0820   BILIRUBINUR NEGATIVE 06/25/2021 0820   KETONESUR NEGATIVE 06/25/2021 0820   PROTEINUR NEGATIVE 06/25/2021 0820   UROBILINOGEN 0.2 04/29/2011 1112   NITRITE NEGATIVE 06/25/2021 0820   LEUKOCYTESUR NEGATIVE 06/25/2021 0820   This document was prepared using Dragon Voice Recognition software and may include unintentional dictation errors.  Dr. Sherre [facility] Location: [place]  If 7PM-7AM, please contact overnight-coverage provider If 7AM-7PM, please contact day attending provider [email or web address]  07/01/2024, 6:12 PM

## 2024-07-01 NOTE — Assessment & Plan Note (Signed)
 Continue aspirin  81 mg a day and Plavix  vascular surgery has been notified recommended to make sure patient is on aspirin 

## 2024-07-02 DIAGNOSIS — E440 Moderate protein-calorie malnutrition: Secondary | ICD-10-CM | POA: Insufficient documentation

## 2024-07-02 DIAGNOSIS — R748 Abnormal levels of other serum enzymes: Secondary | ICD-10-CM | POA: Diagnosis present

## 2024-07-02 LAB — PROCALCITONIN: Procalcitonin: <0.1 ng/mL

## 2024-07-02 LAB — COMPREHENSIVE METABOLIC PANEL WITH GFR
ALT: 23 U/L (ref 0–44)
AST: 34 U/L (ref 15–41)
Albumin: 3.3 g/dL — ABNORMAL LOW (ref 3.5–5.0)
Alkaline Phosphatase: 85 U/L (ref 38–126)
Anion gap: 3 — ABNORMAL LOW (ref 5–15)
BUN: 13 mg/dL (ref 8–23)
CO2: 32 mmol/L (ref 22–32)
Calcium: 8.3 mg/dL — ABNORMAL LOW (ref 8.9–10.3)
Chloride: 98 mmol/L (ref 98–111)
Creatinine, Ser: 0.86 mg/dL (ref 0.61–1.24)
GFR, Estimated: >60 mL/min (ref >60)
Glucose, Bld: 123 mg/dL — ABNORMAL HIGH (ref 70–99)
Potassium: 4.4 mmol/L (ref 3.5–5.1)
Sodium: 133 mmol/L — ABNORMAL LOW (ref 135–145)
Total Bilirubin: 0.7 mg/dL (ref 0.0–1.2)
Total Protein: 6.1 g/dL — ABNORMAL LOW (ref 6.5–8.1)

## 2024-07-02 LAB — BASIC METABOLIC PANEL WITH GFR
Anion gap: 12 (ref 5–15)
BUN: 12 mg/dL (ref 8–23)
CO2: 22 mmol/L (ref 22–32)
Calcium: 8 mg/dL — ABNORMAL LOW (ref 8.9–10.3)
Chloride: 101 mmol/L (ref 98–111)
Creatinine, Ser: 0.8 mg/dL (ref 0.61–1.24)
GFR, Estimated: >60 mL/min (ref >60)
Glucose, Bld: 99 mg/dL (ref 70–99)
Potassium: 4.6 mmol/L (ref 3.5–5.1)
Sodium: 135 mmol/L (ref 135–145)

## 2024-07-02 LAB — GLUCOSE, CAPILLARY
Glucose-Capillary: 223 mg/dL — ABNORMAL HIGH (ref 70–99)
Glucose-Capillary: 159 mg/dL — ABNORMAL HIGH (ref 70–99)
Glucose-Capillary: 156 mg/dL — ABNORMAL HIGH (ref 70–99)

## 2024-07-02 LAB — CBC
HCT: 40.4 % (ref 39.0–52.0)
Hemoglobin: 13.5 g/dL (ref 13.0–17.0)
MCH: 29.9 pg (ref 26.0–34.0)
MCHC: 33.4 g/dL (ref 30.0–36.0)
MCV: 89.4 fL (ref 80.0–100.0)
Platelets: 180 K/uL (ref 150–400)
RBC: 4.52 MIL/uL (ref 4.22–5.81)
RDW: 13.2 % (ref 11.5–15.5)
WBC: 4.4 K/uL (ref 4.0–10.5)
nRBC: 0 % (ref 0.0–0.2)

## 2024-07-02 LAB — PREALBUMIN: Prealbumin: 13 mg/dL — ABNORMAL LOW (ref 18–38)

## 2024-07-02 LAB — T4, FREE: Free T4: 0.75 ng/dL (ref 0.61–1.12)

## 2024-07-02 LAB — PHOSPHORUS: Phosphorus: 3.4 mg/dL (ref 2.5–4.6)

## 2024-07-02 LAB — MAGNESIUM: Magnesium: 2.2 mg/dL (ref 1.7–2.4)

## 2024-07-02 LAB — CK
Total CK: 577 U/L — ABNORMAL HIGH (ref 49–397)
Total CK: 450 U/L — ABNORMAL HIGH (ref 49–397)

## 2024-07-02 LAB — HIV ANTIBODY (ROUTINE TESTING W REFLEX): HIV Screen 4th Generation wRfx: NONREACTIVE

## 2024-07-02 LAB — TSH: TSH: 1.59 u[IU]/mL (ref 0.350–4.500)

## 2024-07-02 MED ORDER — SODIUM CHLORIDE 0.9 % IV SOLN: INTRAVENOUS | Status: AC

## 2024-07-02 MED ORDER — ENSURE PLUS HIGH PROTEIN PO LIQD
237.0000 mL | Freq: Three times a day (TID) | ORAL | Status: DC
  Administered 2024-07-02 – 2024-07-03 (×3): 237 mL via ORAL

## 2024-07-02 NOTE — Progress Notes (Signed)
 [facility]  PROGRESS NOTE  Jackson Mathews FMW:[id number] DOB: 12/30/1955 DOA: 07/01/2024 PCP: Jackson Sharper, MD   Brief HPI:    68 year old male with history of current tobacco use, hyperlipidemia, hypertension, GERD, COPD with as needed oxygen  requirements at home.   07/01/2024: Patient presented to the ED for chief concerns of shortness of breath, cough, chest pain for 2 days.   Vitals at the time of my evaluation showed t of 98.2, rr 27, hr 87, blood pressure 136/70, SpO2 Mathews 2 L nasal cannula.   Serum sodium showed 127, potassium 4.2, chloride 92, bicarb 23, BUN of 10, serum creatinine 0.81, eGFR greater than 60, nonfasting glucose 131, WBC 6.6, hemoglobin 14.7, platelets of 166.   D-dimer was 1.02.  Patient tested positive for influenza A.    Assessment/Plan:    COPD exacerbation (HCC) Suspect multifactorial in setting of tobacco use and influenza A Symptomatic support with DuoNebs 4 times daily, 4 doses ordered Mathews admission As needed albuterol  nebulizer for wheezing and shortness of breath Home long-acting/maintenance inhaler equivalent resumed -Continue Solu-Medrol , switch to prednisone  in a.m.   Musculoskeletal chest pain 1-2 lidocaine  patch every 24 hours as needed   Cough As needed Tussionex nightly, 3 nights ordered   Thrombus of aorta (HCC) Per EDP, he discussed with vascular who recommends aspirin  daily Patient is already Mathews aspirin  81 mg daily Aspirin  81 mg daily resumed   Influenza A Check MRSA PCR Airborne precaution -Continue Tamiflu    Tobacco use As needed nicotine  patch ordered   HLD (hyperlipidemia) Home atorvastatin  40 mg nightly resumed   Essential hypertension Home losartan  50 mg daily, amlodipine  10 mg daily resumed   Carotid artery disease Aspirin  81 mg daily, Plavix  resumed        DVT prophylaxis: Lovenox   Medications     amLODipine   10 mg Oral Daily   aspirin  EC  81 mg Oral Daily   azithromycin   500 mg Oral Daily    clopidogrel   75 mg Oral Daily   enoxaparin  (LOVENOX ) injection  40 mg Subcutaneous Q24H   fluticasone  furoate-vilanterol  1 puff Inhalation Daily   ipratropium-albuterol   3 mL Nebulization QID   lidocaine   1-2 patch Transdermal Q24H   losartan   50 mg Oral Daily   methylPREDNISolone  (SOLU-MEDROL ) injection  40 mg Intravenous Q12H   Followed by   Jackson Mathews 07/03/2024] predniSONE   40 mg Oral Q breakfast   oseltamivir   75 mg Oral BID   pantoprazole   40 mg Oral Q0600   sodium chloride  flush  3 mL Intravenous Q12H     Data Reviewed:   CBG:  No results for input(s): GLUCAP in the last 168 hours.  SpO2: 94 % O2 Flow Rate (L/min): 3 L/min    Vitals:   07/01/24 2045 07/02/24 0335 07/02/24 0742 07/02/24 0907  BP: (!) 162/70 123/69 (!) 154/76   Pulse: 98 72 86   Resp: 20 20    Temp: 98.3 F (36.8 C) 97.7 F (36.5 C) 97.9 F (36.6 C)   TempSrc: Oral     SpO2: 96% 98% 95% 94%  Weight:      Height:          Data Reviewed:  Basic Metabolic Panel: Recent Labs  Lab 07/01/24 0908 07/01/24 1056 07/01/24 2326 07/02/24 0536  NA 127* 125* 133* 135  K 4.2 3.7 4.4 4.6  CL 92*  --  98 101  CO2 23  --  32 22  GLUCOSE 131*  --  123*  99  BUN 10  --  13 12  CREATININE 0.81  --  0.86 0.80  CALCIUM  9.1  --  8.3* 8.0*  MG  --   --  2.2  --   PHOS  --   --  3.4  --     CBC: Recent Labs  Lab 07/01/24 0908 07/01/24 1056 07/02/24 0536  WBC 6.6  --  4.4  NEUTROABS 5.8  --   --   HGB 14.7 13.9 13.5  HCT 43.7 41.0 40.4  MCV 88.8  --  89.4  PLT 166  --  180    LFT Recent Labs  Lab 07/01/24 2326  AST 34  ALT 23  ALKPHOS 85  BILITOT 0.7  PROT 6.1*  ALBUMIN 3.3*     Antibiotics: Anti-infectives (From admission, onward)    Start     Dose/Rate Route Frequency Ordered Stop   07/02/24 2300  azithromycin  (ZITHROMAX ) tablet 500 mg       Placed in Followed by Linked Group   500 mg Oral Daily 07/01/24 2213 07/06/24 0959   07/01/24 2300  azithromycin  (ZITHROMAX ) 500  mg in sodium chloride  0.9 % 250 mL IVPB       Placed in Followed by Linked Group   500 mg 250 mL/hr over 60 Minutes Intravenous Every 24 hours 07/01/24 2213 07/02/24 0133   07/01/24 2300  oseltamivir  (TAMIFLU ) capsule 75 mg        75 mg Oral 2 times daily 07/01/24 2213 07/06/24 2159        CONSULTS   Code Status: Full code  Family Communication: No family at bedside     Subjective   Denies shortness of breath   Objective    Physical Examination:   General-appears in no acute distress Heart-S1-S2, regular, no murmur auscultated Lungs-clear to auscultation bilaterally, no wheezing or crackles auscultated Abdomen-soft, nontender, no organomegaly Extremities-no edema in the lower extremities Neuro-alert, oriented x3, no focal deficit noted       [name] [name]   [facility] If 7PM-7AM, please contact night-coverage at [email or web address], Office  [phone]   07/02/2024, 9:56 AM  LOS: 1 day

## 2024-07-02 NOTE — Plan of Care (Signed)
  Problem: Clinical Measurements: Goal: Will remain free from infection Outcome: Progressing Goal: Respiratory complications will improve Outcome: Progressing   Problem: Activity: Goal: Risk for activity intolerance will decrease Outcome: Progressing   Problem: Pain Managment: Goal: General experience of comfort will improve and/or be controlled Outcome: Progressing

## 2024-07-02 NOTE — Evaluation (Signed)
 Physical Therapy Brief Evaluation and Discharge Note Patient Details Name: Jackson Mathews MRN: [id number] DOB: 04-04-1956 Today's Date: 07/02/2024   History of Present Illness  68 y.o. male presents to [facility] 12/8 with chief concerns of shortness of breath, cough, and chest pain for 2 days. COPD exacerbation suspected multifactorial in setting og tobacco use and influenza A.  PMH: COPD chronic respiratory failure with hypoxia, tobacco abuse, CAD, carotid artery occlusion, recurrent chest pain, dyspnea, HLD, HTN.  Clinical Impression  Patient evaluated by Physical Therapy with no further acute PT needs identified. Pt presents with decreased cardiopulmonary endurance in comparison to baseline. PTA, pt lives alone in a one story house with 2 steps to enter and is a tourist information centre manager. Pt uses oxygen  PRN. Pt ambulating 540 ft with no assistive device independently; requires one standing rest break to maintain oxygen  saturations > 88% on 3L O2, HR up to 110 bpm with activity. Pt provided education on activity recommendations and energy conservation. All education has been completed and the patient has no further questions. No follow-up Physical Therapy or equipment needs. PT is signing off. Thank you for this referral.      PT Assessment Patient does not need any further PT services  Assistance Needed at Discharge  PRN    Equipment Recommendations None recommended by PT  Recommendations for Other Services       Precautions/Restrictions Precautions Precautions: Other (comment) Recall of Precautions/Restrictions: Intact Precaution/Restrictions Comments: watch O2 Restrictions Weight Bearing Restrictions Per Provider Order: No        Mobility  Bed Mobility   Supine/Sidelying to sit: Independent      Transfers Overall transfer level: Independent Equipment used: None                    Ambulation/Gait Ambulation/Gait assistance: Independent Gait Distance (Feet): 540  Feet Assistive device: None Gait Pattern/deviations: WFL(Within Functional Limits)   General Gait Details: One standing rest break  Home Activity Instructions    Stairs            Modified Rankin (Stroke Patients Only)        Balance Overall balance assessment: No apparent balance deficits (not formally assessed)                        Pertinent Vitals/Pain PT - Brief Vital Signs All Vital Signs Stable: No Exception to Vital Signs Stable: Desat to 84% on RA; 90% on 3L O2 Pain Assessment Pain Assessment: Faces Faces Pain Scale: Hurts little more Pain Location: chest when coughs Pain Descriptors / Indicators: Discomfort Pain Intervention(s): Monitored during session     Home Living Family/patient expects to be discharged to:: Private residence Living Arrangements: Alone   Home Environment: Stairs to enter  [employer] of Steps: 2 Home Equipment: Pharmacist, Hospital (2 wheels)        Prior Function Level of Independence: Independent Comments: Retired, tourist information centre manager    UE/LE Assessment   UE ROM/Strength/Tone/Coordination: [employer]    LE ROM/Strength/Tone/Coordination: [employer]      Communication   Communication Communication: No apparent difficulties     Cognition Overall Cognitive Status: Appears within functional limits for tasks assessed/performed       General Comments      Exercises     Assessment/Plan    PT Problem List         PT Visit Diagnosis Difficulty in walking, not elsewhere classified (R26.2)    No Skilled PT All education  completed;Patient is independent with all acitivity/mobility   Co-evaluation                AMPAC 6 Clicks Help needed turning from your back to your side while in a flat bed without using bedrails?: None Help needed moving from lying on your back to sitting on the side of a flat bed without using bedrails?: None Help needed moving to and from a bed to a chair (including a  wheelchair)?: None Help needed standing up from a chair using your arms (e.g., wheelchair or bedside chair)?: None Help needed to walk in hospital room?: None Help needed climbing 3-5 steps with a railing? : None 6 Click Score: 24      End of Session Equipment Utilized During Treatment: Oxygen  Activity Tolerance: Patient tolerated treatment well Patient left: in chair;with call bell/phone within reach Nurse Communication: Mobility status PT Visit Diagnosis: Difficulty in walking, not elsewhere classified (R26.2)     Time: 9165-9149 PT Time Calculation (min) (ACUTE ONLY): 16 min  Charges:   PT Evaluation $PT Eval Low Complexity: 1 Low      Aleck Daring, PT, DPT Acute Rehabilitation Services Office [phone]   Aleck ONEIDA Daring  07/02/2024, 11:16 AM

## 2024-07-02 NOTE — Evaluation (Addendum)
 Occupational Therapy Evaluation and Discharge Patient Details Name: Jackson Mathews MRN: [id number] DOB: Jul 09, 1956 Today's Date: 07/02/2024   History of Present Illness   68 y.o. male presents to [facility] 12/8 with chief concerns of shortness of breath, cough, and chest pain for 2 days. COPD exacerbation suspected multifactorial in setting og tobacco use and influenza A.  PMH: COPD chronic respiratory failure with hypoxia, tobacco abuse, CAD, carotid artery occlusion, recurrent chest pain, dyspnea, HLD, HTN.     Clinical Impressions Pt is demonstrating baseline functional abilities. Pt educated on energy conservation strategies and provided with handout. Educated on functional implications of COPD on functional tasks. Pt does not indicate a need for skilled acute OT services at this time. OT to sign off.      If plan is discharge home, recommend the following:         Functional Status Assessment         Equipment Recommendations         Recommendations for Other Services         Precautions/Restrictions   Precautions Precautions: Other (comment) Recall of Precautions/Restrictions: Intact Precaution/Restrictions Comments: watch O2     Mobility Bed Mobility               General bed mobility comments: Pt greeted in recliner and returned to recliner    Transfers Overall transfer level: Independent Equipment used: None                      Balance Overall balance assessment: Independent                                         ADL either performed or assessed with clinical judgement   ADL Overall ADL's : At baseline                                       General ADL Comments: Pt presents at baseline abilities for ADL tasks     Vision Patient Visual Report: No change from baseline Vision Assessment?: No apparent visual deficits     Perception         Praxis         Pertinent Vitals/Pain Pain  Assessment Pain Assessment: Faces Faces Pain Scale: Hurts little more Pain Location: chest when coughs Pain Descriptors / Indicators: Discomfort Pain Intervention(s): Monitored during session     Extremity/Trunk Assessment Upper Extremity Assessment Upper Extremity Assessment: Overall WFL for tasks assessed   Lower Extremity Assessment Lower Extremity Assessment: Defer to PT evaluation   Cervical / Trunk Assessment Cervical / Trunk Assessment: Normal   Communication Communication Communication: No apparent difficulties   Cognition Arousal: Alert Behavior During Therapy: WFL for tasks assessed/performed Cognition: No apparent impairments                               Following commands: Intact       Cueing  General Comments   Cueing Techniques: Verbal cues  Pt educated on energy conservation strategies and provided with handout.   Exercises     Shoulder Instructions      Home Living Family/patient expects to be discharged to:: Private residence Living Arrangements: Alone Available Help at Discharge: Family;Friend(s);Available PRN/intermittently (Friends live  close) Type of Home: House Home Access: Stairs to enter [employer] of Steps: 2 Entrance Stairs-Rails: Right Home Layout: One level     Bathroom Shower/Tub: Chief Strategy Officer: Standard Bathroom Accessibility: Yes How Accessible: Accessible via walker Home Equipment: Shower seat;Hand held shower head          Prior Functioning/Environment Prior Level of Function : Independent/Modified Independent;Driving             Mobility Comments: Independent ADLs Comments: Indpendent    OT Problem List:     OT Treatment/Interventions:        OT Goals(Current goals can be found in the care plan section)       OT Frequency:       Co-evaluation              AM-PAC OT 6 Clicks Daily Activity     Outcome Measure Help from another person eating  meals?: None Help from another person taking care of personal grooming?: None Help from another person toileting, which includes using toliet, bedpan, or urinal?: None Help from another person bathing (including washing, rinsing, drying)?: None Help from another person to put on and taking off regular upper body clothing?: None Help from another person to put on and taking off regular lower body clothing?: None 6 Click Score: 24   End of Session    Activity Tolerance: Patient tolerated treatment well Patient left: in chair;with call bell/phone within reach;with chair alarm set;Other (comment) (MD present)                   Time: 1012-1030 OT Time Calculation (min): 18 min Charges:  OT General Charges $OT Visit: 1 Visit OT Evaluation $OT Eval Low Complexity: 1 Low  Maurilio CROME, OTR/L.  [facility] Acute Rehabilitation  Office: [phone]   Maurilio PARAS Serafino Burciaga 07/02/2024, 1:41 PM

## 2024-07-02 NOTE — Assessment & Plan Note (Signed)
 DC statin for now Rehydrate gently Check CK in the morning

## 2024-07-02 NOTE — Progress Notes (Signed)
 Initial Nutrition Assessment  DOCUMENTATION CODES:  Non-severe (moderate) malnutrition in context of social or environmental circumstances  INTERVENTION:  Ensure Plus High Protein PO TID. Each supplement provides 350 Kcals and 20 grams of protein. Liberalize diet to regular diet (from heart-healthy) to encourage PO intake.  NUTRITION DIAGNOSIS:  Moderate Malnutrition related to social / environmental circumstances as evidenced by moderate muscle depletion, energy intake < 75% for > or equal to 3 months   GOAL:  Patient will meet greater than or equal to 90% of their needs   MONITOR:  PO intake, Supplement acceptance, Labs  REASON FOR ASSESSMENT:  Consult Assessment of nutrition requirement/status  ASSESSMENT:  Patient presented with shortness of breath and chest pain and was found to have COPD exacerbation, Influenza A, and aortic mural thrombus. PMH significant for HTN, CAD, dyslipidemia, COPD, 2PPD cigarette smoker.  Visited the patient who states that he is not eating much due to feeling winded easily and also not liking the food here. He has only been eating about 25% of meals. He was eating very well PTA like a teenage boy playing football and has gained weight. His UBW is 132-137 lbs. He describes snacking on junk food like potato chips all day and eats one real meal daily because he dislikes cooking for one person. He eats out about 3 times a week at a restaurant where he has meatloaf or chicken fried steak meals. He typically engages in activities like gardening and hunting. Recommended that the patient increase his protein intake and ensure he eats one source of protein at each meal. He was unaware of what a protein was so we discussed some food sources. He has Ensure at home which he has not drank and is interested in having three Ensures daily here.  Typical day's intake: Breakfast - skips Lunch - snacks on junk food like potato chips Dinner - meatloaf or chicken fried  steak meal  Scheduled Meds:  amLODipine   10 mg Oral Daily   aspirin  EC  81 mg Oral Daily   azithromycin   500 mg Oral Daily   clopidogrel   75 mg Oral Daily   enoxaparin  (LOVENOX ) injection  40 mg Subcutaneous Q24H   fluticasone  furoate-vilanterol  1 puff Inhalation Daily   ipratropium-albuterol   3 mL Nebulization QID   lidocaine   1-2 patch Transdermal Q24H   losartan   50 mg Oral Daily   methylPREDNISolone  (SOLU-MEDROL ) injection  40 mg Intravenous Q12H   Followed by   NOREEN ON 07/03/2024] predniSONE   40 mg Oral Q breakfast   oseltamivir   75 mg Oral BID   pantoprazole   40 mg Oral Q0600   sodium chloride  flush  3 mL Intravenous Q12H   Continuous Infusions:  sodium chloride  Stopped (07/02/24 1051)    Diet Order             Diet Heart Room service appropriate? Yes; Fluid consistency: Thin  Diet effective now                  Meal Intake: 25% per patient, 75% charted  Labs:     Latest Ref Rng & Units 07/02/2024    5:36 AM 07/01/2024   11:26 PM 07/01/2024   10:56 AM  CMP  Glucose 70 - 99 mg/dL 99  876    BUN 8 - 23 mg/dL 12  13    Creatinine 9.38 - 1.24 mg/dL 9.19  9.13    Sodium 864 - 145 mmol/L 135  133  125   Potassium  3.5 - 5.1 mmol/L 4.6  4.4  3.7   Chloride 98 - 111 mmol/L 101  98    CO2 22 - 32 mmol/L 22  32    Calcium  8.9 - 10.3 mg/dL 8.0  8.3    Total Protein 6.5 - 8.1 g/dL  6.1    Total Bilirubin 0.0 - 1.2 mg/dL  0.7    Alkaline Phos 38 - 126 U/L  85    AST 15 - 41 U/L  34    ALT 0 - 44 U/L  23     I/O: -1 L since admit  NUTRITION - FOCUSED PHYSICAL EXAM: Flowsheet Row Most Recent Value  Orbital Region No depletion  Upper Arm Region No depletion  Thoracic and Lumbar Region No depletion  Buccal Region No depletion  Temple Region Mild depletion  Clavicle Bone Region No depletion  Clavicle and Acromion Bone Region Moderate depletion  Scapular Bone Region Moderate depletion  Dorsal Hand Mild depletion  Patellar Region No depletion  Anterior Thigh  Region Moderate depletion  Posterior Calf Region Unable to assess  [SCDs]  Edema (RD Assessment) None  Hair Reviewed  Eyes Reviewed  Mouth Reviewed  Skin Reviewed  Nails Reviewed    EDUCATION NEEDS:  Education needs have been addressed  Skin:  Skin Assessment: Reviewed RN Assessment  Last BM:  12/8  Height:  Ht Readings from Last 1 Encounters:  07/01/24 5' 9 (1.753 m)   Weight:  Wt Readings from Last 10 Encounters:  07/01/24 63.5 kg  02/28/24 61.2 kg  11/08/23 59.4 kg  09/12/23 56.9 kg  08/19/22 59.2 kg  07/30/21 59.4 kg  06/28/21 59.9 kg  06/25/21 60.5 kg  06/11/21 59.9 kg  10/29/18 63.5 kg   Weight Change: slight increase in weight noted  Usual Body Weight: 132-137 lbs  Edema: none  Ideal Body Weight:  73 kg   BMI:  Body mass index is 20.67 kg/m.  Estimated Nutritional Needs:  Kcal:  1900-2100 Protein:  100-120 Fluid:  >/=1900    Jackson Ruth, MS, RDN, LDN [place]. [facility] See AMION for contact information Secure chat preferred

## 2024-07-02 NOTE — Progress Notes (Signed)
 SATURATION QUALIFICATIONS: (This note is used to comply with regulatory documentation for home oxygen )  Patient Saturations on Room Air at Rest = 88%  Patient Saturations on Room Air while Ambulating = 84%  Patient Saturations on 3 Liters of oxygen  while Ambulating = 90%  Please briefly explain why patient needs home oxygen : To maintain oxygen  saturations >/= 88% with ambulation.  Aleck Daring, PT, DPT Acute Rehabilitation Services Office [phone]

## 2024-07-03 ENCOUNTER — Other Ambulatory Visit (HOSPITAL_COMMUNITY): Payer: Self-pay

## 2024-07-03 LAB — GLUCOSE, CAPILLARY
Glucose-Capillary: 142 mg/dL — ABNORMAL HIGH (ref 70–99)
Glucose-Capillary: 150 mg/dL — ABNORMAL HIGH (ref 70–99)

## 2024-07-03 LAB — T3: T3, Total: 89 ng/dL (ref 71–180)

## 2024-07-03 MED ORDER — FLUTICASONE PROPIONATE 50 MCG/ACT NA SUSP
2.0000 | Freq: Every day | NASAL | 0 refills | Status: AC
  Filled 2024-07-03: qty 16, 30d supply, fill #0

## 2024-07-03 MED ORDER — ATORVASTATIN CALCIUM 40 MG PO TABS
40.0000 mg | ORAL_TABLET | Freq: Every day | ORAL | 6 refills | Status: AC
  Filled 2024-07-03: qty 30, 30d supply, fill #0

## 2024-07-03 MED ORDER — HYDROCOD POLI-CHLORPHE POLI ER 10-8 MG/5ML PO SUER
5.0000 mL | Freq: Every evening | ORAL | 0 refills | Status: AC | PRN
  Filled 2024-07-03: qty 70, 14d supply, fill #0

## 2024-07-03 MED ORDER — AZITHROMYCIN 250 MG PO TABS
500.0000 mg | ORAL_TABLET | Freq: Every day | ORAL | 0 refills | Status: AC
  Filled 2024-07-03: qty 6, 3d supply, fill #0

## 2024-07-03 MED ORDER — ENSURE PLUS HIGH PROTEIN PO LIQD
237.0000 mL | Freq: Three times a day (TID) | ORAL | 2 refills | Status: AC
  Filled 2024-07-03: qty 21330, 30d supply, fill #0

## 2024-07-03 MED ORDER — PREDNISONE 10 MG PO TABS
ORAL_TABLET | ORAL | 0 refills | Status: AC
  Filled 2024-07-03: qty 26, 11d supply, fill #0

## 2024-07-03 MED ORDER — OSELTAMIVIR PHOSPHATE 75 MG PO CAPS
75.0000 mg | ORAL_CAPSULE | Freq: Two times a day (BID) | ORAL | 0 refills | Status: AC
  Filled 2024-07-03: qty 8, 4d supply, fill #0

## 2024-07-03 NOTE — Discharge Summary (Signed)
 Physician Discharge Summary   Patient: Jackson Mathews MRN: [id number] DOB: Sep 30, 1955  Admit date:     07/01/2024  Discharge date: 07/03/24  Discharge Physician: Elgie Butter   PCP: Hughie Sharper, MD   Recommendations at discharge:  Please follow up with PCP in one week.  Please follow up with pulmonology in 1 to 2 weeks.   Discharge Diagnoses: Principal Problem:   COPD exacerbation (HCC) Active Problems:   Musculoskeletal chest pain   CHEST PAIN   Carotid artery disease   CAD in native artery-cath 09/24/15 non obstructive   Essential hypertension   HLD (hyperlipidemia)   Acute respiratory failure with hypoxia (HCC)   Tobacco use   Hyponatremia   Influenza A   Aortic mural thrombus (HCC)   Cough   Elevated CK   Malnutrition of moderate degree  Resolved Problems:   * No resolved hospital problems. *  Hospital Course:   68 year old male with history of current tobacco use, hyperlipidemia, hypertension, GERD, COPD with as needed oxygen  requirements at home.presented with shortness of breath, cough, chest pain for 2 days. Labs were significant for tamiflu .     Assessment and Plan:   * COPD exacerbation (HCC) Suspect multifactorial in setting of tobacco use and influenza A Symptomatic support with DuoNebs 4 times daily, 4 doses ordered on admission As needed albuterol  nebulizer for wheezing and shortness of breath Home long-acting/maintenance inhaler equivalent resumed. Wheezing resolved.  Discharged on  a tapering doses of prednisone .   Musculoskeletal chest pain Resolved.   Cough As needed Tussionex nightly,.  Aortic mural thrombus (HCC) Per EDP, he discussed with vascular who recommends aspirin  daily Discussed with Dr Silver, recommended patient to be on aspirin . No need for follow up   Influenza A Continue tamiflu  on discharge to complete the course.   Tobacco use As needed nicotine  patch ordered  HLD (hyperlipidemia) Statin held for a week for  elevated CK.  To resume statin in one week.   Essential hypertension Resume home meds on discharge.   Carotid artery disease Resume aspirin  and plavix .          Consultants: none. Procedures performed: none.   Disposition: Home Diet recommendation:  Discharge Diet Orders (From admission, onward)     Start     Ordered   07/03/24 0000  Diet - low sodium heart healthy        07/03/24 1049           Regular diet DISCHARGE MEDICATION: Allergies as of 07/03/2024       Reactions   Codeine Nausea Only        Medication List     TAKE these medications    albuterol  108 (90 Base) MCG/ACT inhaler Commonly known as: VENTOLIN  HFA Inhale 2 puffs into the lungs 3 (three) times daily for 5 days, THEN 2 puffs every 6 (six) hours as needed. Start taking on: September 12, 2023   amLODipine  10 MG tablet Commonly known as: NORVASC  Take 1 tablet (10 mg total) by mouth daily.   aspirin  EC 81 MG tablet Take 1 tablet (81 mg total) by mouth daily. Swallow whole.   atorvastatin  40 MG tablet Commonly known as: Lipitor  Take 1 tablet (40 mg total) by mouth daily. Start taking on: July 09, 2024 What changed: These instructions start on July 09, 2024. If you are unsure what to do until then, ask your doctor or other care provider.   azithromycin  250 MG tablet Commonly known as: ZITHROMAX  Take 2 tablets (  500 mg total) by mouth daily for 3 days.   budesonide -formoterol  160-4.5 MCG/ACT inhaler Commonly known as: SYMBICORT  Inhale 2 puffs into the lungs 2 (two) times daily.   chlorpheniramine-HYDROcodone 10-8 MG/5ML Commonly known as: TUSSIONEX Take 5 mLs by mouth at bedtime as needed for cough.   clopidogrel  75 MG tablet Commonly known as: PLAVIX  Take 75 mg by mouth daily.   feeding supplement Liqd Take 237 mLs by mouth 3 (three) times daily between meals.   fluticasone  50 MCG/ACT nasal spray Commonly known as: FLONASE  Place 2 sprays into both nostrils daily for  7 days.   loratadine  10 MG tablet Commonly known as: CLARITIN  Take 1 tablet (10 mg total) by mouth daily.   losartan  50 MG tablet Commonly known as: COZAAR  Take 1 tablet (50 mg total) by mouth daily.   nicotine  14 mg/24hr patch Commonly known as: NICODERM CQ  - dosed in mg/24 hours Place 1 patch (14 mg total) onto the skin daily.   oseltamivir  75 MG capsule Commonly known as: TAMIFLU  Take 1 capsule (75 mg total) by mouth 2 (two) times daily for 4 days.   pantoprazole  40 MG tablet Commonly known as: PROTONIX  Take 1 tablet (40 mg total) by mouth daily at 6 (six) AM.   predniSONE  10 MG tablet Commonly known as: DELTASONE  Take 4 tablets (40 mg total) by mouth daily for 2 days, THEN 3 tablets (30 mg total) daily for 3 days, THEN 2 tablets (20 mg total) daily for 3 days, THEN 1 tablet (10 mg total) daily for 3 days. Start taking on: July 04, 2024 What changed: See the new instructions.   umeclidinium-vilanterol 62.5-25 MCG/ACT Aepb Commonly known as: ANORO ELLIPTA Inhale 1 puff into the lungs daily as needed (Difficulty breathing).        Follow-up Information     Hilts, Michael, MD Follow up in 2 week(s).   Specialty: Family Medicine Contact information: [address] [place] [address] [phone]                Discharge Exam: [name]   07/01/24 0910  Weight: 63.5 kg   General exam: Appears calm and comfortable  Respiratory system: Clear to auscultation. Respiratory effort normal. Cardiovascular system: S1 & S2 heard, RRR. No JVD, murmurs, rubs, gallops or clicks. No pedal edema. Gastrointestinal system: Abdomen is nondistended, soft and nontender. No organomegaly or masses felt. Normal bowel sounds heard. Central nervous system: Alert and oriented. No focal neurological deficits. Extremities: Symmetric 5 x 5 power. Skin: No rashes, lesions or ulcers Psychiatry: Judgement and insight appear normal. Mood & affect appropriate.    Condition  at discharge: fair  The results of significant diagnostics from this hospitalization (including imaging, microbiology, ancillary and laboratory) are listed below for reference.   Imaging Studies: CT Angio Chest PE W and/or Wo Contrast Result Date: 07/01/2024 CLINICAL DATA:  Pulmonary embolism (PE) suspected, low to intermediate prob, positive D-dimer Shortness of breath with cough and chest pain for 2 days. EXAM: CT ANGIOGRAPHY CHEST WITH CONTRAST TECHNIQUE: Multidetector CT imaging of the chest was performed using the standard protocol during bolus administration of intravenous contrast. Multiplanar CT image reconstructions and MIPs were obtained to evaluate the vascular anatomy. RADIATION DOSE REDUCTION: This exam was performed according to the departmental dose-optimization program which includes automated exposure control, adjustment of the mA and/or kV according to patient size and/or use of iterative reconstruction technique. CONTRAST:  75mL OMNIPAQUE  IOHEXOL  350 MG/ML SOLN COMPARISON:  Radiographs 07/01/2024 and 09/06/2023. FINDINGS:  Cardiovascular: The pulmonary arteries are well opacified with contrast to the level of the segmental branches. There is no evidence of acute pulmonary embolism. Atherosclerosis of the aorta, great vessels and coronary arteries. No definite acute systemic arterial abnormalities. There is irregular mural thrombus within the aortic arch and descending aorta. The heart size is normal. There is no pericardial effusion. Mediastinum/Nodes: There are no enlarged mediastinal, hilar or axillary lymph nodes. Small hiatal hernia. The thyroid  gland appears unremarkable. Lungs/Pleura: No pleural effusion or pneumothorax. Mild centrilobular emphysema with diffuse central airway thickening and scattered ill-defined pulmonary nodularity bilaterally. In the right lower lobe, there are small clustered nodules measuring up to 9 x 7 mm on image 114/6. On the axial images, this appears  potentially cavitary, not substantiated on the reformatted images. There are additional scattered smaller nodules, including a 5 mm left upper lobe nodule on image 31/6 and a 4 mm left lower lobe nodule on image 91/6. No confluent airspace disease. Upper abdomen: No acute findings are demonstrated within the visualized upper abdomen. There is aortic and branch vessel atherosclerosis. Variant vascular anatomy with a probable replaced right hepatic artery. Musculoskeletal/Chest wall: There is no chest wall mass or suspicious osseous finding. Mild spondylosis. Review of the MIP images confirms the above findings. IMPRESSION: 1. No evidence of acute pulmonary embolism or other acute vascular findings in the chest. 2. Scattered ill-defined pulmonary nodularity bilaterally, largest in the right lower lobe measuring up to 9 x 7 mm. These are likely inflammatory/infectious, but nonspecific. Per Fleischner Society Guidelines,if patient is low risk for malignancy, no routine follow-up imaging is recommended. If patient is high risk for malignancy, a non-contrast Chest CT at 12 months is optional. If performed and the nodule is stable at 12 months, no further follow-up is recommended. These guidelines do not apply to immunocompromised patients and patients with cancer. Follow up in patients with significant comorbidities as clinically warranted. For lung cancer screening, adhere to Lung-RADS guidelines. Reference: Radiology. 2017; 284(1):228-43. 3. No confluent airspace disease or pleural effusion. 4. Irregular mural thrombus within the aortic arch and descending thoracic aorta. 5. Aortic Atherosclerosis (ICD10-I70.0) and Emphysema (ICD10-J43.9). Electronically Signed   By: Elsie Perone M.D.   On: 07/01/2024 11:47   DG Chest Port 1 View Result Date: 07/01/2024 EXAM: 1 VIEW(S) XRAY OF THE CHEST 07/01/2024 09:19:00 AM COMPARISON: 09/06/2023 CLINICAL HISTORY: SOB, cough FINDINGS: LINES, TUBES AND DEVICES: Multiple wires and  leads project over the chest on the frontal radiograph. LUNGS AND PLEURA: Hyperinflation. No focal pulmonary opacity. No pleural effusion. No pneumothorax. HEART AND MEDIASTINUM: No acute abnormality of the cardiac and mediastinal silhouettes. BONES AND SOFT TISSUES: No acute osseous abnormality. IMPRESSION: 1. Hyperinflation, likely representing COPD. No superimposed acute process. Electronically signed by: Rockey Kilts MD 07/01/2024 10:15 AM EST RP Workstation: [id number]    Microbiology: Results for orders placed or performed during the hospital encounter of 07/01/24  Resp panel by RT-PCR (RSV, Flu A&B, Covid) Anterior Nasal Swab     Status: Abnormal   Collection Time: 07/01/24  9:08 AM   Specimen: Anterior Nasal Swab  Result Value Ref Range Status   SARS Coronavirus 2 by RT PCR NEGATIVE NEGATIVE Final    Comment: (NOTE) SARS-CoV-2 target nucleic acids are NOT DETECTED.  The SARS-CoV-2 RNA is generally detectable in upper respiratory specimens during the acute phase of infection. The lowest concentration of SARS-CoV-2 viral copies this assay can detect is 138 copies/mL. A negative result does not preclude SARS-Cov-2 infection and should  not be used as the sole basis for treatment or other patient management decisions. A negative result may occur with  improper specimen collection/handling, submission of specimen other than nasopharyngeal swab, presence of viral mutation(s) within the areas targeted by this assay, and inadequate number of viral copies(<138 copies/mL). A negative result must be combined with clinical observations, patient history, and epidemiological information. The expected result is Negative.  Fact Sheet for Patients:  bloggercourse.com  Fact Sheet for Healthcare Providers:  seriousbroker.it  This test is no t yet approved or cleared by the [place]  FDA and  has been authorized for detection and/or diagnosis of  SARS-CoV-2 by FDA under an Emergency Use Authorization (EUA). This EUA will remain  in effect (meaning this test can be used) for the duration of the COVID-19 declaration under Section 564(b)(1) of the Act, 21 U.S.C.section 360bbb-3(b)(1), unless the authorization is terminated  or revoked sooner.       Influenza A by PCR POSITIVE (A) NEGATIVE Final   Influenza B by PCR NEGATIVE NEGATIVE Final    Comment: (NOTE) The Xpert Xpress SARS-CoV-2/FLU/RSV plus assay is intended as an aid in the diagnosis of influenza from Nasopharyngeal swab specimens and should not be used as a sole basis for treatment. Nasal washings and aspirates are unacceptable for Xpert Xpress SARS-CoV-2/FLU/RSV testing.  Fact Sheet for Patients: bloggercourse.com  Fact Sheet for Healthcare Providers: seriousbroker.it  This test is not yet approved or cleared by the [place]  FDA and has been authorized for detection and/or diagnosis of SARS-CoV-2 by FDA under an Emergency Use Authorization (EUA). This EUA will remain in effect (meaning this test can be used) for the duration of the COVID-19 declaration under Section 564(b)(1) of the Act, 21 U.S.C. section 360bbb-3(b)(1), unless the authorization is terminated or revoked.     Resp Syncytial Virus by PCR NEGATIVE NEGATIVE Final    Comment: (NOTE) Fact Sheet for Patients: bloggercourse.com  Fact Sheet for Healthcare Providers: seriousbroker.it  This test is not yet approved or cleared by the [place]  FDA and has been authorized for detection and/or diagnosis of SARS-CoV-2 by FDA under an Emergency Use Authorization (EUA). This EUA will remain in effect (meaning this test can be used) for the duration of the COVID-19 declaration under Section 564(b)(1) of the Act, 21 U.S.C. section 360bbb-3(b)(1), unless the authorization is terminated  or revoked.  Performed at [facility], [address] [place], [place] [address]   MRSA Next Gen by PCR, Nasal     Status: None   Collection Time: 07/01/24  6:53 PM   Specimen: Nasal Mucosa; Nasal Swab  Result Value Ref Range Status   MRSA by PCR Next Gen NOT DETECTED NOT DETECTED Final    Comment: (NOTE) The GeneXpert MRSA Assay (FDA approved for NASAL specimens only), is one component of a comprehensive MRSA colonization surveillance program. It is not intended to diagnose MRSA infection nor to guide or monitor treatment for MRSA infections. Test performance is not FDA approved in patients less than 42 years old. Performed at [facility], [address], [place], [place] [address]     Labs: CBC: Recent Labs  Lab 07/01/24 0908 07/01/24 1056 07/02/24 0536  WBC 6.6  --  4.4  NEUTROABS 5.8  --   --   HGB 14.7 13.9 13.5  HCT 43.7 41.0 40.4  MCV 88.8  --  89.4  PLT 166  --  180   Basic Metabolic Panel: Recent Labs  Lab 07/01/24 0908  07/01/24 1056 07/01/24 2326 07/02/24 0536  NA 127* 125* 133* 135  K 4.2 3.7 4.4 4.6  CL 92*  --  98 101  CO2 23  --  32 22  GLUCOSE 131*  --  123* 99  BUN 10  --  13 12  CREATININE 0.81  --  0.86 0.80  CALCIUM  9.1  --  8.3* 8.0*  MG  --   --  2.2  --   PHOS  --   --  3.4  --    Liver Function Tests: Recent Labs  Lab 07/01/24 2326  AST 34  ALT 23  ALKPHOS 85  BILITOT 0.7  PROT 6.1*  ALBUMIN 3.3*   CBG: Recent Labs  Lab 07/02/24 1120 07/02/24 1620 07/02/24 2139 07/03/24 0817 07/03/24 1143  GLUCAP 223* 159* 156* 142* 150*    Discharge time spent: 36 minutes.   Signed: Elgie Butter, MD [facility] 07/03/2024

## 2024-07-03 NOTE — Plan of Care (Signed)
   Problem: Health Behavior/Discharge Planning: Goal: Ability to manage health-related needs will improve Outcome: Progressing   Problem: Clinical Measurements: Goal: Ability to maintain clinical measurements within normal limits will improve Outcome: Progressing

## 2024-07-09 ENCOUNTER — Telehealth: Payer: Self-pay

## 2024-07-09 NOTE — Telephone Encounter (Signed)
 Copied from CRM #[id number]. Topic: Appointments - Scheduling Inquiry for Clinic >> Jul 04, 2024  1:08 PM Leila C wrote: Reason for CRM: Patient [phone] states was released from [facility] yesterday and xray shows lun nodules. Patient wants to compare xray from Dr. Meade and yesterday xray, if the nodules is new or has changes. Patient wants to schedule a hospital follow up in 2 weeks, after finishing Prednisone  taper 07/14/24. Patient wants me to speak with his sister Arland. Informed Arland is not on DPR and patient will have to sign a new and updated DPR for Arland to have access. Patient is scheduled for 08/05/24 at 10 am with Dr. Kara, patient is asking to be seen sooner. Informed patient, there's no appointment early, next appointment available for Dr. Kara is 08/21/24. Per CAL, send message to clinical. Patient states he is willing to be seen anytime, patient is concerned if it's cancer, please advise and call back. Patient asked if patient doesn't answer, please leave a message.   Called and spoke with the pt and offered ov 1/5 with Dr. Annella and pt denied. Pt would like to stay scheduled with Dr. Kara 1/12. Nothing further needed

## 2024-08-05 ENCOUNTER — Ambulatory Visit: Admitting: Pulmonary Disease

## 2024-08-05 ENCOUNTER — Encounter: Payer: Self-pay | Admitting: Pulmonary Disease

## 2024-08-05 ENCOUNTER — Telehealth: Payer: Self-pay | Admitting: Pulmonary Disease

## 2024-08-05 VITALS — BP 116/84 | HR 99 | Ht 69.0 in | Wt 141.0 lb

## 2024-08-05 DIAGNOSIS — F17210 Nicotine dependence, cigarettes, uncomplicated: Secondary | ICD-10-CM | POA: Diagnosis not present

## 2024-08-05 DIAGNOSIS — J432 Centrilobular emphysema: Secondary | ICD-10-CM

## 2024-08-05 DIAGNOSIS — J9611 Chronic respiratory failure with hypoxia: Secondary | ICD-10-CM

## 2024-08-05 DIAGNOSIS — R918 Other nonspecific abnormal finding of lung field: Secondary | ICD-10-CM | POA: Diagnosis not present

## 2024-08-05 MED ORDER — BREZTRI AEROSPHERE 160-9-4.8 MCG/ACT IN AERO: INHALATION_SPRAY | RESPIRATORY_TRACT | Status: AC

## 2024-08-05 NOTE — Telephone Encounter (Signed)
 Which ICS/LABA/LAMA inhaler is covered by his insurance?  Which ICS/LABA inhaler are covered?  Which LAMA inhaler is best covered?  Thanks, Dorn Chill, MD [facility] Office: [phone]

## 2024-08-05 NOTE — Assessment & Plan Note (Addendum)
 SABRA

## 2024-08-05 NOTE — Patient Instructions (Addendum)
 I have messaged the pharmacy team to help determine cost of inhalers  Continue breztri  inhaler 2 puffs twice daily - rinse mouth out after each use  Use albuterol  inhaler 1-2 puffs every 4-6 hours as needed  We will schedule you for a follow up CT Chest scan in early February  Follow up in 5 weeks to review CT Chest scan

## 2024-08-05 NOTE — Progress Notes (Unsigned)
 "  New Patient Pulmonology Office Visit   Subjective:  Patient ID: Jackson Mathews, male    DOB: 11-20-1955  MRN: [id number]  Referred by: Hughie Sharper, MD  CC:  Chief Complaint  Patient presents with   Medical Management of Chronic Issues    TOC - Desai  Pt states - breztri  no covering / x ray / Nodules ?    Discussed the use of AI scribe software for clinical note transcription with the patient, who gave verbal consent to proceed.  History of Present Illness Jackson Mathews is a 69 year old male with COPD and emphysema who presents for follow-up regarding lung nodules and medication management. He is accompanied by his sister.  He was hospitalized in December for flu with COPD exacerbation, and had pneumonia in February from which he recovered. He uses home oxygen  as needed, not during sleep. Home oximetry is usually 91 to 95% and he has not noted readings in the 80s since discharge.  He uses Breztri  but is almost out and cannot afford the $600 per month cost. He is considering switching back to Symbicort , which he has used twice daily in the past with good effect and can obtain for about $105. He has an albuterol  inhaler for rescue but uses it infrequently.  He currently smokes about 1 pack per day, down from 2 packs per day before his recent hospitalization, with an estimated 40 pack-year history.  He was told he has lung nodules found during a recent hospitalization, about 9 mm in size, and is concerned about lung cancer. A prior physician suggested they might represent inflammation from flu. He has no prior CT scans for comparison.  He does not frequently use albuterol  and notes occasional dizziness with coughing.        Review of Systems  Constitutional:  Negative for chills, fever, malaise/fatigue and weight loss.  HENT:  Negative for congestion, sinus pain and sore throat.   Eyes: Negative.   Respiratory:  Positive for cough, sputum production and shortness of  breath. Negative for hemoptysis and wheezing.   Cardiovascular:  Negative for chest pain, palpitations, orthopnea, claudication and leg swelling.  Gastrointestinal:  Negative for abdominal pain, heartburn, nausea and vomiting.  Genitourinary: Negative.   Musculoskeletal:  Negative for joint pain and myalgias.  Skin:  Negative for rash.  Neurological:  Negative for weakness.  Endo/Heme/Allergies: Negative.   Psychiatric/Behavioral: Negative.      Allergies: Codeine Current Medications[1] Past Medical History:  Diagnosis Date   CAD in native artery-cath 09/24/15 non obstructive 09/24/2015   Calf pain    Cancer (HCC)    Skin   Carotid artery occlusion    Chest pain    COPD (chronic obstructive pulmonary disease) (HCC)    Dyspnea    Hearing loss    Heart murmur    History of kidney stones    History of right-sided carotid endarterectomy 09/23/2015   Hx of colonic polyp - ssp 06/21/2015   Hypercholesteremia    Hypertension    Hypoxia 10/12/2018   Lower back pain    Palpitations    Tinnitus    Tobacco use 09/23/2015   Vertigo    Past Surgical History:  Procedure Laterality Date   CARDIAC CATHETERIZATION N/A 09/24/2015   Procedure: Left Heart Cath and Coronary Angiography;  Surgeon: Lonni JONETTA Cash, MD;  Location: [facility]VASIVE CV LAB;  Service: Cardiovascular;  Laterality: N/A;   CAROTID ENDARTERECTOMY  05/05/11   Right CEA   MELANOMA  EXCISION     l eye   SKIN CANCER EXCISION     TRANSCAROTID ARTERY REVASCULARIZATION  Right 06/28/2021   Procedure: RIGHT TRANSCAROTID ARTERY REVASCULARIZATION;  Surgeon: Lanis Fonda BRAVO, MD;  Location: [facility] OR;  Service: Vascular;  Laterality: Right;   Family History  Problem Relation Age of Onset   Heart disease Mother    Cancer Father    Heart attack Father        s/p CABG   Emphysema Father    Diabetes Brother    Emphysema Paternal Grandmother    Colon cancer Neg Hx    Esophageal cancer Neg Hx    Ulcerative colitis Neg Hx     Stomach cancer Neg Hx    Rectal cancer Neg Hx    Social History   Socioeconomic History   Marital status: Single    Spouse name: Not on file   Number of children: Not on file   Years of education: Not on file   Highest education level: Not on file  Occupational History   Not on file  Tobacco Use   Smoking status: Every Day    Current packs/day: 2.00    Average packs/day: 2.0 packs/day for 54.0 years (108.1 ttl pk-yrs)    Types: Cigarettes    Start date: 20   Smokeless tobacco: Never   Tobacco comments:    Smokes 2 packs of cigarettes  a day. [facility] 02/28/2024  Vaping Use   Vaping status: Never Used  Substance and Sexual Activity   Alcohol  use: No    Alcohol /week: 0.0 standard drinks of alcohol    Drug use: No   Sexual activity: Not Currently  Other Topics Concern   Not on file  Social History Narrative   Not on file   Social Drivers of Health   Tobacco Use: High Risk (08/05/2024)   Patient History    Smoking Tobacco Use: Every Day    Smokeless Tobacco Use: Never    Passive Exposure: Not on file  Financial Resource Strain: Not on file  Food Insecurity: No Food Insecurity (07/01/2024)   Epic    Worried About Programme Researcher, Broadcasting/film/video in the Last Year: Never true    Ran Out of Food in the Last Year: Never true  Transportation Needs: No Transportation Needs (07/01/2024)   Epic    Lack of Transportation (Medical): No    Lack of Transportation (Non-Medical): No  Physical Activity: Not on file  Stress: Not on file  Social Connections: Socially Isolated (07/01/2024)   Social Connection and Isolation Panel    Frequency of Communication with Friends and Family: More than three times a week    Frequency of Social Gatherings with Friends and Family: Three times a week    Attends Religious Services: Never    Active Member of Clubs or Organizations: No    Attends Banker Meetings: Never    Marital Status: Divorced  Catering Manager Violence: Not At Risk  (07/01/2024)   Epic    Fear of Current or Ex-Partner: No    Emotionally Abused: No    Physically Abused: No    Sexually Abused: No  Depression (PHQ2-9): Not on file  Alcohol  Screen: Not on file  Housing: Low Risk (07/01/2024)   Epic    Unable to Pay for Housing in the Last Year: No    Number of Times Moved in the Last Year: 0    Homeless in the Last Year: No  Utilities: Not At Risk (  07/01/2024)   Epic    Threatened with loss of utilities: No  Health Literacy: Not on file       Objective:  BP 116/84   Pulse 99   Ht 5' 9 (1.753 m) Comment: per pt  Wt 141 lb (64 kg)   SpO2 96%   BMI 20.82 kg/m    Physical Exam Constitutional:      General: He is not in acute distress.    Appearance: Normal appearance.  Eyes:     General: No scleral icterus.    Conjunctiva/sclera: Conjunctivae normal.  Cardiovascular:     Rate and Rhythm: Normal rate and regular rhythm.  Pulmonary:     Breath sounds: No wheezing, rhonchi or rales.  Musculoskeletal:     Right lower leg: No edema.     Left lower leg: No edema.  Skin:    General: Skin is warm and dry.  Neurological:     General: No focal deficit present.     Diagnostic Review:  Last CBC Lab Results  Component Value Date   WBC 4.4 07/02/2024   HGB 13.5 07/02/2024   HCT 40.4 07/02/2024   MCV 89.4 07/02/2024   MCH 29.9 07/02/2024   RDW 13.2 07/02/2024   PLT 180 07/02/2024   Last metabolic panel Lab Results  Component Value Date   GLUCOSE 99 07/02/2024   NA 135 07/02/2024   K 4.6 07/02/2024   CL 101 07/02/2024   CO2 22 07/02/2024   BUN 12 07/02/2024   CREATININE 0.80 07/02/2024   GFRNONAA >60 07/02/2024   CALCIUM  8.0 (L) 07/02/2024   PHOS 3.4 07/01/2024   PROT 6.1 (L) 07/01/2024   ALBUMIN 3.3 (L) 07/01/2024   BILITOT 0.7 07/01/2024   ALKPHOS 85 07/01/2024   AST 34 07/01/2024   ALT 23 07/01/2024   ANIONGAP 12 07/02/2024    CTA Chest 07/01/24 1. No evidence of acute pulmonary embolism or other acute  vascular findings in the chest. 2. Scattered ill-defined pulmonary nodularity bilaterally, largest in the right lower lobe measuring up to 9 x 7 mm. These are likely inflammatory/infectious, but nonspecific. Per Fleischner Society Guidelines,if patient is low risk for malignancy, no routine follow-up imaging is recommended. If patient is high risk for malignancy, a non-contrast Chest CT at 12 months is optional. If performed and the nodule is stable at 12 months, no further follow-up is recommended. These guidelines do not apply to immunocompromised patients and patients with cancer. Follow up in patients with significant comorbidities as clinically warranted. For lung cancer screening, adhere to Lung-RADS guidelines. Reference: Radiology. 2017; 284(1):228-43. 3. No confluent airspace disease or pleural effusion. 4. Irregular mural thrombus within the aortic arch and descending thoracic aorta. 5. Aortic Atherosclerosis (ICD10-I70.0) and Emphysema (ICD10-J43.9).     Assessment & Plan:   Assessment & Plan Chronic respiratory failure with hypoxia (HCC)     Centrilobular emphysema (HCC)     Lung nodules  Orders:   CT Chest Wo Contrast; Future   Assessment and Plan Assessment & Plan Centrilobular emphysema  - Messaged pharmacy team to determine coverage for inhalers. - Provided sample of breztri , take 2 puffs twice daily - use albuterol  inhaler as needed  Pulmonary nodules under surveillance for malignancy Pulmonary nodules approximately 9 mm. High risk for lung cancer due to smoking history and age. Differential includes benign causes. No prior CT scans for comparison. Plan to monitor with follow-up CT scan in one month. Discussed potential malignancy and need for monitoring. - Ordered follow-up  CT scan in one month to assess pulmonary nodules. - Schedule follow-up appointment in mid-February to review CT scan results.  Tobacco use disorder Current smoker with reduced  intake to one pack per day. High risk for lung cancer. Previous cessation attempt was short-lived due to stress.      Return in about 5 weeks (around 09/09/2024) for f/u visit Dr. Kara.   Dorn KATHEE Kara, MD     [1]  Current Outpatient Medications:    albuterol  (VENTOLIN  HFA) 108 (90 Base) MCG/ACT inhaler, Inhale 2 puffs into the lungs 3 (three) times daily for 5 days, THEN 2 puffs every 6 (six) hours as needed., Disp: , Rfl:    amLODipine  (NORVASC ) 10 MG tablet, Take 1 tablet (10 mg total) by mouth daily., Disp: 90 tablet, Rfl: 0   aspirin  EC 81 MG tablet, Take 1 tablet (81 mg total) by mouth daily. Swallow whole., Disp: 30 tablet, Rfl: 11   atorvastatin  (LIPITOR ) 40 MG tablet, Take 1 tablet (40 mg total) by mouth daily., Disp: 30 tablet, Rfl: 6   budesonide -glycopyrrolate -formoterol  (BREZTRI  AEROSPHERE) 160-9-4.8 MCG/ACT AERO inhaler, 4 samples, Disp: , Rfl:    chlorpheniramine-HYDROcodone (TUSSIONEX) 10-8 MG/5ML, Take 5 mLs by mouth at bedtime as needed for cough., Disp: 70 mL, Rfl: 0   clopidogrel  (PLAVIX ) 75 MG tablet, Take 75 mg by mouth daily., Disp: , Rfl:    feeding supplement (ENSURE PLUS HIGH PROTEIN) LIQD, Take 237 mLs by mouth 3 (three) times daily between meals., Disp: 21330 mL, Rfl: 2   fluticasone  (FLONASE ) 50 MCG/ACT nasal spray, Place 2 sprays into both nostrils daily for 7 days., Disp: 16 g, Rfl: 0   loratadine  (CLARITIN ) 10 MG tablet, Take 1 tablet (10 mg total) by mouth daily., Disp: 30 tablet, Rfl: 0   losartan  (COZAAR ) 50 MG tablet, Take 1 tablet (50 mg total) by mouth daily., Disp: 90 tablet, Rfl: 0   nicotine  (NICODERM CQ  - DOSED IN MG/24 HOURS) 14 mg/24hr patch, Place 1 patch (14 mg total) onto the skin daily., Disp: 28 patch, Rfl: 0   pantoprazole  (PROTONIX ) 40 MG tablet, Take 1 tablet (40 mg total) by mouth daily at 6 (six) AM., Disp: 30 tablet, Rfl: 1  "

## 2024-08-07 ENCOUNTER — Encounter: Payer: Self-pay | Admitting: Pulmonary Disease

## 2024-08-08 ENCOUNTER — Other Ambulatory Visit (HOSPITAL_COMMUNITY): Payer: Self-pay

## 2024-08-08 NOTE — Telephone Encounter (Signed)
 Can we please DC the patient's Breztri  order and then refer back to Jackson Mathews and clinical pharmacy to see if it is possible to assess his formulary and costs?

## 2024-08-12 NOTE — Telephone Encounter (Signed)
 Spoke with pharmacy was never filled only on file / hold until needed

## 2024-08-30 ENCOUNTER — Ambulatory Visit: Admission: RE | Admit: 2024-08-30 | Source: Ambulatory Visit

## 2024-08-30 DIAGNOSIS — R918 Other nonspecific abnormal finding of lung field: Secondary | ICD-10-CM

## 2024-09-10 ENCOUNTER — Ambulatory Visit: Admitting: Pulmonary Disease
